# Patient Record
Sex: Male | Born: 1970
Health system: Southern US, Community
[De-identification: ages and names within clinical notes are randomized; demographics above are authoritative.]

## PROBLEM LIST (undated history)

## (undated) DIAGNOSIS — M75111 Incomplete rotator cuff tear or rupture of right shoulder, not specified as traumatic: Secondary | ICD-10-CM

## (undated) DIAGNOSIS — E785 Hyperlipidemia, unspecified: Secondary | ICD-10-CM

## (undated) DIAGNOSIS — D649 Anemia, unspecified: Secondary | ICD-10-CM

## (undated) DIAGNOSIS — K222 Esophageal obstruction: Secondary | ICD-10-CM

## (undated) DIAGNOSIS — I4891 Unspecified atrial fibrillation: Secondary | ICD-10-CM

## (undated) DIAGNOSIS — I509 Heart failure, unspecified: Secondary | ICD-10-CM

## (undated) DIAGNOSIS — K219 Gastro-esophageal reflux disease without esophagitis: Secondary | ICD-10-CM

## (undated) DIAGNOSIS — I4510 Unspecified right bundle-branch block: Secondary | ICD-10-CM

## (undated) DIAGNOSIS — G4730 Sleep apnea, unspecified: Secondary | ICD-10-CM

## (undated) DIAGNOSIS — N529 Male erectile dysfunction, unspecified: Secondary | ICD-10-CM

## (undated) DIAGNOSIS — A609 Anogenital herpesviral infection, unspecified: Secondary | ICD-10-CM

## (undated) HISTORY — PX: COLONOSCOPY: SHX174

## (undated) HISTORY — DX: Esophageal obstruction: K22.2

## (undated) HISTORY — DX: Gilbert syndrome: E80.4

## (undated) HISTORY — PX: INGUINAL HERNIA REPAIR: SHX194

## (undated) HISTORY — DX: Anemia, unspecified: D64.9

## (undated) HISTORY — DX: Hyperlipidemia, unspecified: E78.5

## (undated) HISTORY — DX: Gastro-esophageal reflux disease without esophagitis: K21.9

---

## 1992-03-07 HISTORY — PX: FINGER SURGERY: SHX640

## 1998-05-29 ENCOUNTER — Emergency Department (HOSPITAL_COMMUNITY): Admission: EM | Admit: 1998-05-29 | Discharge: 1998-05-29 | Payer: Self-pay | Admitting: Emergency Medicine

## 1999-08-14 ENCOUNTER — Emergency Department (HOSPITAL_COMMUNITY): Admission: EM | Admit: 1999-08-14 | Discharge: 1999-08-14 | Payer: Self-pay | Admitting: Emergency Medicine

## 2004-04-22 ENCOUNTER — Ambulatory Visit: Payer: Self-pay | Admitting: Internal Medicine

## 2004-04-26 ENCOUNTER — Ambulatory Visit: Payer: Self-pay | Admitting: Internal Medicine

## 2004-10-25 ENCOUNTER — Ambulatory Visit: Payer: Self-pay | Admitting: Internal Medicine

## 2005-02-11 ENCOUNTER — Ambulatory Visit: Payer: Self-pay | Admitting: Internal Medicine

## 2005-02-18 ENCOUNTER — Ambulatory Visit: Payer: Self-pay | Admitting: Internal Medicine

## 2005-04-28 ENCOUNTER — Ambulatory Visit: Payer: Self-pay | Admitting: Internal Medicine

## 2005-09-08 ENCOUNTER — Ambulatory Visit: Payer: Self-pay | Admitting: Internal Medicine

## 2005-09-26 ENCOUNTER — Ambulatory Visit: Payer: Self-pay | Admitting: Internal Medicine

## 2006-05-15 ENCOUNTER — Ambulatory Visit: Payer: Self-pay | Admitting: Internal Medicine

## 2006-05-22 ENCOUNTER — Ambulatory Visit: Payer: Self-pay | Admitting: Internal Medicine

## 2006-05-22 LAB — CONVERTED CEMR LAB
Albumin: 4 g/dL (ref 3.5–5.2)
Alkaline Phosphatase: 66 units/L (ref 39–117)
BUN: 13 mg/dL (ref 6–23)
CO2: 32 meq/L (ref 19–32)
Creatinine, Ser: 1 mg/dL (ref 0.4–1.5)
GFR calc non Af Amer: 89 mL/min
Glucose, Bld: 87 mg/dL (ref 70–99)
TSH: 0.8 microintl units/mL (ref 0.35–5.50)
Total Bilirubin: 0.8 mg/dL (ref 0.3–1.2)
Total Protein: 6.7 g/dL (ref 6.0–8.3)

## 2006-05-31 ENCOUNTER — Ambulatory Visit: Payer: Self-pay | Admitting: Internal Medicine

## 2007-09-05 ENCOUNTER — Ambulatory Visit: Payer: Self-pay | Admitting: Internal Medicine

## 2007-09-05 LAB — CONVERTED CEMR LAB
Basophils Absolute: 0.1 10*3/uL (ref 0.0–0.1)
Bilirubin Urine: NEGATIVE
Bilirubin, Direct: 0.1 mg/dL (ref 0.0–0.3)
Blood in Urine, dipstick: NEGATIVE
Calcium: 9.4 mg/dL (ref 8.4–10.5)
Cholesterol: 186 mg/dL (ref 0–200)
Eosinophils Absolute: 0.2 10*3/uL (ref 0.0–0.7)
GFR calc Af Amer: 96 mL/min
GFR calc non Af Amer: 79 mL/min
HCT: 40.2 % (ref 39.0–52.0)
HDL: 45.8 mg/dL (ref >39.0)
Hemoglobin: 13.9 g/dL (ref 13.0–17.0)
Ketones, urine, test strip: NEGATIVE
MCHC: 34.6 g/dL (ref 30.0–36.0)
Monocytes Absolute: 0.5 10*3/uL (ref 0.1–1.0)
Neutro Abs: 2.1 10*3/uL (ref 1.4–7.7)
Platelets: 209 10*3/uL (ref 150–400)
RDW: 12.5 % (ref 11.5–14.6)
Sodium: 142 meq/L (ref 135–145)
Specific Gravity, Urine: 1.02
Total Bilirubin: 0.9 mg/dL (ref 0.3–1.2)
Total Protein: 6.9 g/dL (ref 6.0–8.3)
Triglycerides: 56 mg/dL (ref 0–149)
Urobilinogen, UA: 0.2

## 2007-09-14 ENCOUNTER — Ambulatory Visit: Payer: Self-pay | Admitting: Internal Medicine

## 2007-09-14 DIAGNOSIS — J45909 Unspecified asthma, uncomplicated: Secondary | ICD-10-CM | POA: Insufficient documentation

## 2007-09-14 DIAGNOSIS — S6710XA Crushing injury of unspecified finger(s), initial encounter: Secondary | ICD-10-CM | POA: Insufficient documentation

## 2007-09-14 DIAGNOSIS — J309 Allergic rhinitis, unspecified: Secondary | ICD-10-CM | POA: Insufficient documentation

## 2007-09-14 DIAGNOSIS — Z87898 Personal history of other specified conditions: Secondary | ICD-10-CM | POA: Insufficient documentation

## 2007-09-14 DIAGNOSIS — E785 Hyperlipidemia, unspecified: Secondary | ICD-10-CM | POA: Insufficient documentation

## 2007-12-04 ENCOUNTER — Encounter: Payer: Self-pay | Admitting: Internal Medicine

## 2008-09-16 ENCOUNTER — Telehealth (INDEPENDENT_AMBULATORY_CARE_PROVIDER_SITE_OTHER): Payer: Self-pay | Admitting: *Deleted

## 2008-09-16 ENCOUNTER — Ambulatory Visit: Payer: Self-pay | Admitting: Internal Medicine

## 2008-09-16 DIAGNOSIS — R42 Dizziness and giddiness: Secondary | ICD-10-CM | POA: Insufficient documentation

## 2008-09-16 DIAGNOSIS — I959 Hypotension, unspecified: Secondary | ICD-10-CM | POA: Insufficient documentation

## 2008-09-16 LAB — CONVERTED CEMR LAB: Hemoglobin: 14.6 g/dL

## 2008-09-17 ENCOUNTER — Encounter: Payer: Self-pay | Admitting: Internal Medicine

## 2010-04-04 LAB — CONVERTED CEMR LAB
Cholesterol, target level: 200 mg/dL
HDL goal, serum: 40 mg/dL
LDL Goal: 75 mg/dL

## 2010-05-01 ENCOUNTER — Encounter: Payer: Self-pay | Admitting: Internal Medicine

## 2010-06-15 ENCOUNTER — Encounter: Payer: Self-pay | Admitting: Internal Medicine

## 2010-06-15 ENCOUNTER — Ambulatory Visit (INDEPENDENT_AMBULATORY_CARE_PROVIDER_SITE_OTHER): Payer: BC Managed Care – PPO | Admitting: Internal Medicine

## 2010-06-15 VITALS — BP 141/85 | HR 61 | Temp 98.3°F | Resp 14 | Wt 171.8 lb

## 2010-06-15 DIAGNOSIS — E785 Hyperlipidemia, unspecified: Secondary | ICD-10-CM

## 2010-06-15 DIAGNOSIS — K625 Hemorrhage of anus and rectum: Secondary | ICD-10-CM

## 2010-06-15 DIAGNOSIS — Z0000 Encounter for general adult medical examination without abnormal findings: Secondary | ICD-10-CM

## 2010-06-15 NOTE — Patient Instructions (Addendum)
Preventive Health Care: Exercise at least 30-45 minutes a day,  3-4 days a week.  Eat a low-fat diet with lots of fruits and vegetables, up to 7-9 servings per day. Avoid obesity; your goal is waist measurement < 40 inches.Consume less than 40 grams of sugar per day from foods & drinks with High Fructose Corn Sugar as #2,3 or # 4 on label. Eye Doctor - have an eye exam @ least annually.                                                                               Alcohol If you drink, do it moderately,less than 9 drinks per week, preferably less than 6 @ most. Depression is common in our stressful world.If you're feeling down or losing interest in things you normally enjoy, please call .                                                                                                                               Please schedule fasting Labs : Boston Heart Panel ( 1304X) , CBC & dif  ( Codes: V70.0, 272.4, Please call Dr Ardell Isaacs office & verify colonoscopy monitor schedule he recommends.

## 2010-06-15 NOTE — Progress Notes (Signed)
  Subjective:   Patient ID: Jacob Buchanan, male    DOB: 1971/03/01 , 40 yo   BJY:[id number]  HPI Jacob Buchanan is here for a physical; he has had intermittent ( monthly) uncomfortable ( pressure) but not painful  bright  rectal bleeding. Colonoscopy was negative in 2007. Internal hemorrhoids have been documented @ Flex Sigmoidoscopy & colonoscopy.    Review of Systems Patient reports no  vision/ hearing changes,anorexia, weight change, fever ,adenopathy, persistant / recurrent hoarseness, swallowing issues, chest pain,palpitations, edema,persistant / recurrent cough, hemoptysis, dyspnea(rest, exertional, paroxysmal nocturnal), melena, abdominal pain, excessive heart burn, GU symptoms( dysuria, hematuria, pyuria, ) syncope, focal weakness, memory loss,numbness & tingling, skin/hair/nail changes,depression, anxiety, abnormal bruising/bleeding, musculoskeletal symptoms/signs.     Objective:  Physical Exam   Gen.: Thin but healthy and well-nourished in appearance. Alert, appropriate and cooperative throughout exam. Head: Normocephalic without obvious abnormalities;no  alopecia. Eyes: No corneal or conjunctival inflammation noted. Pupils equal round reactive to light and accommodation. Fundal exam is benign without hemorrhages, exudate, papilledema. Extraocular motion intact. Vision grossly normal. Ears: External  ear exam reveals no significant lesions or deformities. Canals clear .TMs normal. Hearing is grossly normal bilaterally. Nose: External nasal exam reveals no deformity or inflammation. Nasal mucosa are pink and moist. No lesions or exudates noted. Septum  Slightly dislocated.  Mouth: Oral mucosa and oropharynx reveal no lesions or exudates. Teeth in good repair. Neck: No deformities, masses, or tenderness noted. Range of motion normal. Thyroid  w/o nodules or enlargement. Lungs: Normal respiratory effort; chest expands symmetrically. Lungs are clear to auscultation without rales, wheezes, or  increased work of breathing. Heart: Normal rate and rhythm. Normal S1 and S2. No gallop, click, or rub. No murmur. Abdomen: Bowel sounds normal; abdomen soft and nontender. No masses, organomegaly or hernias noted. Genitalia: Scrotal and penile  exam varicocoele on L . DRE  Minimal asymmetry(L> R) w/o enlargement , induration or nodules . Musculoskeletal/extremities: No deformity or scoliosis noted of  the thoracic or lumbar spine. No clubbing, cyanosis, edema, or deformity noted. Range of motion  normal .Tone & strength  normal.Joints normal. Nail health  good. Vascular: Carotid, radial artery, dorsalis pedis and dorsalis posterior tibial pulses are full and equal. No bruits present. Neurologic: Alert and oriented x3. Deep tendon reflexes symmetrical and normal. Skin: Intact without suspicious lesions or rashes.  Lymph: No cervical, axillary, or inguinal lymphadenopathy present.  Psych: Mood and affect are normal. Normally interactive                                                                                  Assessment & Plan:  #1Wellness Exam #2 recurrent rectal bleeding from internal hemorrhoids #3 Dyslipidemia  Plan: #1 Boston Heart Panel (1304X) #2 Colonoscopic monitor as  per Dr Russella Dar

## 2010-06-15 NOTE — Assessment & Plan Note (Addendum)
LDL 137 in 2004. NMR Liipoprofile  2006: LDL 131 ( 2168/1697), HDL  33, TG 182. LDL goal = < 100.

## 2010-06-16 ENCOUNTER — Encounter: Payer: Self-pay | Admitting: Internal Medicine

## 2010-06-18 ENCOUNTER — Other Ambulatory Visit (INDEPENDENT_AMBULATORY_CARE_PROVIDER_SITE_OTHER): Payer: BC Managed Care – PPO

## 2010-06-18 DIAGNOSIS — Z0000 Encounter for general adult medical examination without abnormal findings: Secondary | ICD-10-CM

## 2010-06-18 DIAGNOSIS — E785 Hyperlipidemia, unspecified: Secondary | ICD-10-CM

## 2010-06-18 LAB — CBC WITH DIFFERENTIAL/PLATELET
Basophils Absolute: 0 10*3/uL (ref 0.0–0.1)
Eosinophils Absolute: 0.3 10*3/uL (ref 0.0–0.7)
HCT: 41.7 % (ref 39.0–52.0)
Lymphocytes Relative: 29.2 % (ref 12.0–46.0)
Lymphs Abs: 1.1 10*3/uL (ref 0.7–4.0)
MCHC: 35.2 g/dL (ref 30.0–36.0)
Monocytes Relative: 12.4 % — ABNORMAL HIGH (ref 3.0–12.0)
Platelets: 178 10*3/uL (ref 150.0–400.0)
RDW: 12.6 % (ref 11.5–14.6)

## 2010-07-09 ENCOUNTER — Encounter: Payer: Self-pay | Admitting: Internal Medicine

## 2010-07-23 NOTE — Letter (Signed)
October 24, 2005     [name], M.D.  [address]  [place], [place]  [address]   RE:  [name], [name]  MRN:  [id number]  /  DOB:  1970-07-03   Dear Lajuana Ripple:   I saw Jacob Buchanan on July 5 with rectal bleeding which has been an  intermittent phenomena for over 5 years.  He stated that over the previous  month 85% of the bowel movements have been associated with some blood in the  toilet water.  He had some discomfort.  He denies any melena.  He had no  constitutional symptoms associated with this.   Colonoscopy performed by you in July of 2004 revealed only internal  hemorrhoids.  He also had a flexible sigmoidoscopy in 2001 which showed  internal hemorrhoids as well.  There has never been evidence of colitis.   On exam hemoccult testing was negative.  There was a tiny fissure in ano.  Prostate was borderline enlarged.   Rectal hygiene was discussed and Proctofoam-HC prescribed following sitz  baths.   He called back on the 20 of August stating that the rectal bleeding was  persisting.   Would you be kind enough to schedule a GI consultation.  He has had no  evidence of any bleeding dyscrasia.    Respectively,.       Titus Dubin. Alwyn Ren, MD, FACP, [facility]   [facility]  DD:  10/24/2005  DT:  10/25/2005  Job #:  [id number]

## 2010-10-13 ENCOUNTER — Ambulatory Visit (INDEPENDENT_AMBULATORY_CARE_PROVIDER_SITE_OTHER): Payer: BC Managed Care – PPO | Admitting: Internal Medicine

## 2010-10-13 ENCOUNTER — Encounter: Payer: Self-pay | Admitting: Internal Medicine

## 2010-10-13 ENCOUNTER — Telehealth: Payer: Self-pay | Admitting: Internal Medicine

## 2010-10-13 VITALS — BP 132/98 | HR 85 | Wt 177.0 lb

## 2010-10-13 DIAGNOSIS — F32A Depression, unspecified: Secondary | ICD-10-CM

## 2010-10-13 DIAGNOSIS — F329 Major depressive disorder, single episode, unspecified: Secondary | ICD-10-CM

## 2010-10-13 MED ORDER — VENLAFAXINE HCL ER 37.5 MG PO CP24: 37.5000 mg | ORAL_CAPSULE | ORAL | Status: DC

## 2010-10-13 NOTE — Progress Notes (Signed)
  Subjective:    Patient ID: Jacob Buchanan, male    DOB: 1970-04-21, 40 y.o.   MRN: [id number]  HPI Mental Health Symptoms: Onset: within past 12 months as  Increased somnulence,  being unhappy & tired Anxiety:no Depression:6 -7 on 10 scale Loss of interest (Anhedonia):no Panic attacks:no Anger: 3 years Insomnia:no Anorexia:decreased Fatigue:6 on 10 scale Suicidal ideation: Yes but no plans for  acting Neurologic signs/symptoms: no headache, numbness and tingling, weakness: Endocrinologic signs and symptoms: no hoarseness, weight change, vision change, temperature intolerance, bowel changes, skin/hair,/nail changes Family history of mental health issues, alcoholism or drug abuse: mother on Prozac for 3 decades with + response; both sisters on anti depressants Medications/efficacy:no He & his wife are seeing separate  therapists; they're also seeing a marriage counselor jointly.She is seeing Dr Jennelle Human, Psychiatrist. He saw Dr. Tomasa Rand, Psychiatrist, last week; it is his impression that Dr. Tomasa Rand felt he might have bipolar disorder.       Review of Systems     Objective:   Physical Exam he was not examined but we discussed in detail the pathophysiology of neurotransmitter deficiencies. Diagrams were provided. The screening test for bipolar disorder was administered. He answered all queries  as negative and "no problem".      Assessment & Plan:  #1 profound depression; rule out bipolar disorder  #2 family history of depression; his mother definitely improved on an SSRI. He will verify medications his sisters are taking  Plan: Effexor will be titrated to a dose of 150 mg daily over 3 weeks. The risk of exacerbating bipolar disorder was discussed and he understands the pathophysiology of this.  He will contact me with the name of the psychiatrist withwhom he has an appointment in October.

## 2010-10-13 NOTE — Patient Instructions (Signed)
Please review the diagrams we discussed and the screening form. Titrate the medication by one pill every 5 days until  @  a dose of 150 mg. Please provide the name of the psychiatrist whom I can contact.

## 2010-10-13 NOTE — Telephone Encounter (Signed)
Spoke with patient's wife, per Dr.Hopper have patient come in at 4:45 today to further discuss depression

## 2010-10-15 ENCOUNTER — Telehealth: Payer: Self-pay | Admitting: Internal Medicine

## 2011-02-03 ENCOUNTER — Encounter: Payer: Self-pay | Admitting: Internal Medicine

## 2011-02-03 ENCOUNTER — Ambulatory Visit (INDEPENDENT_AMBULATORY_CARE_PROVIDER_SITE_OTHER): Payer: BC Managed Care – PPO | Admitting: Internal Medicine

## 2011-02-03 VITALS — BP 118/76 | HR 85 | Temp 98.9°F | Wt 181.6 lb

## 2011-02-03 DIAGNOSIS — J069 Acute upper respiratory infection, unspecified: Secondary | ICD-10-CM

## 2011-02-03 NOTE — Patient Instructions (Signed)
Plain Mucinex for thick secretions ;force NON dairy fluids 48 hrs. Use a Neti pot daily as needed for sinus congestion Zicam Melts or Zinc lozenges ; vitamin C 2000 mg daily; & Echinacea for 4-7 days. Report fever, exudate("pus") or progressive pain. Advair one inhalation every 12 hours; gargle and spit after use. Nasonex  sample 1 spray in each nostril twice a day as needed. Use the "crossover" technique as discussed

## 2011-02-03 NOTE — Progress Notes (Signed)
  Subjective:    Patient ID: Jacob Buchanan, male    DOB: 1970-05-07, 40 y.o.   MRN: [id number]  HPI Respiratory tract infection Onset/symptoms:11/26 as head & chest congestion Exposures (illness/environmental/extrinsic):occupational dust exposure Progression of symptoms:to cough Treatments/response:Neti pot, Nyquil, Claritin Present symptoms: Fever/chills/sweats:occasional chills Frontal headache:no Facial pain:maxillary pressure Nasal purulence:no Sore throat:no Dental pain:no Lymphadenopathy:no Wheezing/shortness of breath:slight SOB & wheezing Cough/sputum/hemoptysis:occasional clear- yellow sputum Associated extrinsic/allergic symptoms:itchy eyes/ sneezing:eyes watering; some sneezing Past medical history: Seasonal allergies:seasonal /asthma:quiescent for years Smoking history:never           Review of Systems     Objective:   Physical Exam General appearance is of good health and nourishment; no acute distress or increased work of breathing is present.  No  lymphadenopathy about the head, neck, or axilla noted.   Eyes: No conjunctival inflammation or lid edema is present.   Ears:  External ear exam shows no significant lesions or deformities.  Otoscopic examination reveals clear canals, tympanic membranes are intact bilaterally without bulging, retraction, inflammation or discharge.  Nose:  External nasal examination shows no deformity or inflammation. Nasal mucosa are pink and moist without lesions or exudates. Slight  septal dislocation to l; deviation to R.No obstruction to airflow. Slight hyponasal speech  Oral exam: Dental hygiene is good; lips and gums are healthy appearing.There is no oropharyngeal erythema or exudate noted.     Heart:  Normal rate and regular rhythm. S1 and S2 normal without gallop, murmur, click, rub or other extra sounds.   Lungs:Chest clear to auscultation; no wheezes, rhonchi,rales ,or rubs present.No increased work of breathing.      Extremities:  No cyanosis, edema, or clubbing  noted    Skin: Warm & dry           Assessment & Plan:  #1 viral URI; no RAD clinically Plan: See orders and recommendations

## 2011-09-06 ENCOUNTER — Telehealth: Payer: Self-pay | Admitting: Internal Medicine

## 2011-09-06 NOTE — Telephone Encounter (Signed)
Caller: Edon/Patient; PCP: Marga Melnick; CB#: [phone]; ; ; Call regarding Cough/Congestion;  Has nasal congestion/cough since 6-27. Coughs more when lays down at night. Is coughing up yellow mucus. Has sinus pressure. Afebrile. Appointment scheduled for 7-3 at 1000 with Dr. Alwyn Ren.

## 2011-09-07 ENCOUNTER — Ambulatory Visit (INDEPENDENT_AMBULATORY_CARE_PROVIDER_SITE_OTHER): Payer: BC Managed Care – PPO | Admitting: Internal Medicine

## 2011-09-07 ENCOUNTER — Encounter: Payer: Self-pay | Admitting: Internal Medicine

## 2011-09-07 VITALS — BP 118/76 | HR 64 | Temp 98.7°F | Wt 171.0 lb

## 2011-09-07 DIAGNOSIS — J209 Acute bronchitis, unspecified: Secondary | ICD-10-CM

## 2011-09-07 DIAGNOSIS — J019 Acute sinusitis, unspecified: Secondary | ICD-10-CM

## 2011-09-07 MED ORDER — HYDROCODONE-HOMATROPINE 5-1.5 MG/5ML PO SYRP: 5.0000 mL | ORAL_SOLUTION | Freq: Four times a day (QID) | ORAL | Status: AC | PRN

## 2011-09-07 MED ORDER — AMOXICILLIN-POT CLAVULANATE 875-125 MG PO TABS: 1.0000 | ORAL_TABLET | Freq: Two times a day (BID) | ORAL | Status: AC

## 2011-09-07 NOTE — Progress Notes (Signed)
  Subjective:    Patient ID: Jacob Buchanan, male    DOB: 08/27/1970, 41 y.o.   MRN: [id number]  HPI Respiratory tract infection Onset/symptoms: 09/02/11 with productive cough and post nasal drainage Exposures (illness/environmental/extrinsic): none Progression of symptoms: improved slightly Treatments/response: neti pot with some relief Present symptoms: mild sore throat, post nasal drainage, productive cough with brown sputum which is worse at night, brown nasal purulence, and cervical LA  Nasal purulence:brown   Wheezing/shortness of breath:mainly @ night Cough/sputum/hemoptysis:brown sputum  Associated extrinsic/allergic symptoms:itchy eyes/ sneezing:none prior to this episode Past medical history: Seasonal allergies/asthma:yes Smoking history:never           Review of Systems Fever/chills/sweats:no Frontal headache:no Facial pain:no Dental pain:no    Objective:   Physical Exam  he is thin, healthy and well-nourished  He has no lymphadenopathy about the neck or axilla. He is very ticklish.  There is no conjunctival inflammation  ENT exam is unremarkable except for mild septal dislocation or deviation  He has an S4 with no significant murmur gallop  Chest is clear with no wheezing or increased work of breathing  He has no cyanosis clubbing or edema         Assessment & Plan:  #1 bronchitis, acute with associated upper respiratory tract infection, rhinosinusitis  #2 history of asthma; clinically at this time there is no bronchospasm but he describes nocturnal wheezing  Plan: Sample of Advair will be employed every 12 hours. Antibiotics and cough syrup will be prescribed.

## 2011-09-07 NOTE — Patient Instructions (Addendum)
Plain Mucinex for thick secretions ;force NON dairy fluids . Use a Neti pot daily as needed for sinus congestion; going from open side to congested side . Nasal cleansing in the shower as discussed. Make sure that all residual soap is removed to prevent irritation. Plain Allegra 160 daily as needed for itchy eyes & sneezing. Advair sample one inhalation every 12 hours; gargle and spit after use

## 2011-09-26 ENCOUNTER — Telehealth: Payer: Self-pay | Admitting: Internal Medicine

## 2011-09-26 DIAGNOSIS — E785 Hyperlipidemia, unspecified: Secondary | ICD-10-CM

## 2011-09-26 DIAGNOSIS — Z0000 Encounter for general adult medical examination without abnormal findings: Secondary | ICD-10-CM

## 2011-09-26 NOTE — Telephone Encounter (Signed)
Pt would like to come in the morning of his cpe on 7/25 and have his labs done. He is requesting an STD check in addition to routine labs. Can you put the orders in the system?

## 2011-09-26 NOTE — Telephone Encounter (Signed)
Lipid/Hep/BMP/CBCD/TSH v70.0/272.4 (Future orders placed for these labs)  Dr.Hopper please advise on patient's request for STD testing and give DX codes

## 2011-09-27 NOTE — Telephone Encounter (Signed)
Add HIV, VDRL,HSV2 ; V70.0

## 2011-09-29 ENCOUNTER — Ambulatory Visit (INDEPENDENT_AMBULATORY_CARE_PROVIDER_SITE_OTHER): Payer: BC Managed Care – PPO | Admitting: Internal Medicine

## 2011-09-29 ENCOUNTER — Other Ambulatory Visit (INDEPENDENT_AMBULATORY_CARE_PROVIDER_SITE_OTHER): Payer: BC Managed Care – PPO

## 2011-09-29 ENCOUNTER — Encounter: Payer: Self-pay | Admitting: Internal Medicine

## 2011-09-29 VITALS — BP 138/80 | HR 62 | Temp 98.5°F | Resp 12 | Ht 73.5 in | Wt 172.2 lb

## 2011-09-29 DIAGNOSIS — E785 Hyperlipidemia, unspecified: Secondary | ICD-10-CM

## 2011-09-29 DIAGNOSIS — Z0000 Encounter for general adult medical examination without abnormal findings: Secondary | ICD-10-CM

## 2011-09-29 LAB — BASIC METABOLIC PANEL
BUN: 16 mg/dL (ref 6–23)
Chloride: 103 mEq/L (ref 96–112)
Creatinine, Ser: 1 mg/dL (ref 0.4–1.5)
GFR: 84.55 mL/min (ref >60.00)
Potassium: 4.1 mEq/L (ref 3.5–5.1)

## 2011-09-29 LAB — HEPATIC FUNCTION PANEL
ALT: 16 U/L (ref 0–53)
AST: 24 U/L (ref 0–37)
Bilirubin, Direct: 0 mg/dL (ref 0.0–0.3)
Total Bilirubin: 1 mg/dL (ref 0.3–1.2)

## 2011-09-29 LAB — CBC WITH DIFFERENTIAL/PLATELET
Basophils Absolute: 0.1 10*3/uL (ref 0.0–0.1)
Basophils Relative: 1.1 % (ref 0.0–3.0)
Eosinophils Absolute: 0.3 10*3/uL (ref 0.0–0.7)
Lymphocytes Relative: 25.4 % (ref 12.0–46.0)
MCHC: 34.6 g/dL (ref 30.0–36.0)
MCV: 97.4 fl (ref 78.0–100.0)
Monocytes Absolute: 0.6 10*3/uL (ref 0.1–1.0)
Neutrophils Relative %: 56.3 % (ref 43.0–77.0)
RBC: 4.95 Mil/uL (ref 4.22–5.81)
RDW: 12.7 % (ref 11.5–14.6)

## 2011-09-29 LAB — LIPID PANEL
Cholesterol: 192 mg/dL (ref 0–200)
LDL Cholesterol: 112 mg/dL — ABNORMAL HIGH (ref 0–99)
Total CHOL/HDL Ratio: 3
Triglycerides: 102 mg/dL (ref 0.0–149.0)
VLDL: 20.4 mg/dL (ref 0.0–40.0)

## 2011-09-29 LAB — RPR

## 2011-09-29 MED ORDER — FLUTICASONE-SALMETEROL 250-50 MCG/DOSE IN AEPB: 1.0000 | INHALATION_SPRAY | Freq: Two times a day (BID) | RESPIRATORY_TRACT | Status: DC

## 2011-09-29 MED ORDER — HYDROCORTISONE ACE-PRAMOXINE 1-1 % RE FOAM: 1.0000 | Freq: Two times a day (BID) | RECTAL | Status: AC

## 2011-09-29 NOTE — Patient Instructions (Addendum)
Soak your buttocks in Sitz bath twice a day and then apply the cream to the hemorrhoidal tissue. Advair one inhalation every 12 hours; gargle and spit after use . To prevent palpitations or tremor, avoid stimulants such as decongestants, diet pills, nicotine, or caffeine (coffee, tea, cola, or chocolate) to excess. Please try to go on My Chart within the next 24 hours to allow me to release the results directly to you.

## 2011-09-29 NOTE — Progress Notes (Signed)
  Subjective:    Patient ID: Jacob Buchanan, male    DOB: Oct 15, 1970, 41 y.o.   MRN: [id number]  HPI Mr Everett is here for a physical;acute issues include hand tremor intermittently.     Review of Systems He has noted intermittent tremor of his hands over the past year. It does appear to be intentional as he notes is present drinking water or writing at times. He does not relate this to lack of sleep or stimulants.  He's had intermittent nonproductive cough. He specifically denies fever, chills, nasal purulence, or purulent sputum     Objective:   Physical Exam Gen.: Thin but healthy and well-nourished in appearance. Alert, appropriate and cooperative throughout exam. Head: Normocephalic without obvious abnormalities; no alopecia  Eyes: No corneal or conjunctival inflammation noted. Pupils equal round reactive to light and accommodation. Fundal exam is benign without hemorrhages, exudate, papilledema. Extraocular motion intact. Vision grossly normal. Ears: External  ear exam reveals no significant lesions or deformities. Canals clear .TMs normal. Hearing is grossly normal bilaterally. Nose: External nasal exam reveals no deformity or inflammation. Nasal mucosa are pink and moist. No lesions or exudates noted.   Mouth: Oral mucosa and oropharynx reveal no lesions or exudates. Teeth in good repair. Neck: No deformities, masses, or tenderness noted. Range of motion & Thyroid normal. Lungs: Normal respiratory effort; chest expands symmetrically. Lungs are clear to auscultation without rales, wheezes, or increased work of breathing. Heart: Normal rate and rhythm. Normal S1 and S2. No gallop,  or rub. Click @ apex w/o  murmur. Abdomen: Bowel sounds normal; abdomen soft and nontender. No masses, organomegaly or hernias noted. Genitalia/ DRE: Asymmetry of testes; left greater than right in size. Small granuloma in the left epididymal area. Small varices on the left. External hemorrhoid.Prostate  is normal without enlargement, asymmetry, nodularity, or induration.  Musculoskeletal/extremities: No deformity or scoliosis noted of  the thoracic or lumbar spine. No clubbing, cyanosis, edema, or deformity noted. Range of motion  normal .Tone & strength  normal.Joints normal. Nail health  good. Vascular: Carotid, radial artery, dorsalis pedis and  posterior tibial pulses are full and equal. No bruits present. Neurologic: Alert and oriented x3. Deep tendon reflexes symmetrical and normal.          Skin: Intact without suspicious lesions or rashes. Lymph: No cervical, axillary, or inguinal lymphadenopathy present. Psych: Mood and affect are normal. Normally interactive                                                                                         Assessment & Plan:  #1 comprehensive physical exam; no acute findings #2 see Problem List with Assessments & Recommendations #3 benign essential tremor; nonselective beta blocker could be employed as needed if there is no asthma present. Stimulants should be to minimize #4 cough, probable post infectious reactive airways symptoms  #5 external hemorrhoid Plan: see Orders

## 2011-09-29 NOTE — Progress Notes (Signed)
Labs only

## 2011-09-30 LAB — HSV 2 ANTIBODY, IGG: HSV 2 Glycoprotein G Ab, IgG: 0.12 IV

## 2011-09-30 LAB — HIV ANTIBODY (ROUTINE TESTING W REFLEX): HIV: NONREACTIVE

## 2011-11-03 ENCOUNTER — Telehealth: Payer: Self-pay | Admitting: Internal Medicine

## 2011-11-03 NOTE — Telephone Encounter (Signed)
Caller: Crespin/Patient; Patient Name: Jacob Buchanan; PCP: Marga Melnick; Best Callback Phone Number: [phone].  Pt. calling about last TD.  Looked up information in EPIC:  09/2007.  Pt. encurred a scrape on his leg yesterday.  He declines triage, and just wanted to make sure his TD was up to date.  CAN/db.

## 2011-11-03 NOTE — Telephone Encounter (Signed)
[facility] ([facility]

## 2011-11-15 ENCOUNTER — Telehealth: Payer: Self-pay | Admitting: Internal Medicine

## 2011-11-15 ENCOUNTER — Encounter: Payer: Self-pay | Admitting: Gastroenterology

## 2011-11-15 DIAGNOSIS — K649 Unspecified hemorrhoids: Secondary | ICD-10-CM

## 2011-11-15 MED ORDER — SILDENAFIL CITRATE 100 MG PO TABS: ORAL_TABLET | ORAL | Status: DC

## 2011-11-15 NOTE — Telephone Encounter (Signed)
Pt states that he recently discuss these issues at last OV.Please advise on referral

## 2011-11-15 NOTE — Telephone Encounter (Signed)
Recommend referral to GI if he wants to pursue treatment of hemorrhoids.  A trial of Viagra 100 mg one half-1 pill daily as needed. Dispense 5, refill x2

## 2011-11-15 NOTE — Telephone Encounter (Signed)
Jacob Buchanan [phone]: Patient request to speak with Dr. Alwyn Ren regarding ED and hemorrhoids. Wants to get Dr. Frederik Pear advice on what he needs to do or would he need a referral to a specialist. Please call him back. Thanks

## 2011-11-15 NOTE — Telephone Encounter (Signed)
Spoke with patient, referral placed, rx sent to pharmacy

## 2011-12-13 ENCOUNTER — Encounter: Payer: Self-pay | Admitting: Gastroenterology

## 2011-12-13 ENCOUNTER — Ambulatory Visit (INDEPENDENT_AMBULATORY_CARE_PROVIDER_SITE_OTHER): Payer: BC Managed Care – PPO | Admitting: Gastroenterology

## 2011-12-13 VITALS — BP 112/60 | HR 72 | Ht 73.5 in | Wt 170.2 lb

## 2011-12-13 DIAGNOSIS — R195 Other fecal abnormalities: Secondary | ICD-10-CM

## 2011-12-13 DIAGNOSIS — K921 Melena: Secondary | ICD-10-CM

## 2011-12-13 MED ORDER — HYDROCORTISONE ACE-PRAMOXINE 2.5-1 % RE CREA: TOPICAL_CREAM | Freq: Two times a day (BID) | RECTAL | Status: DC

## 2011-12-13 MED ORDER — HYDROCORTISONE ACETATE 25 MG RE SUPP: 25.0000 mg | Freq: Two times a day (BID) | RECTAL | Status: DC | PRN

## 2011-12-13 MED ORDER — PEG-KCL-NACL-NASULF-NA ASC-C 100 G PO SOLR: 1.0000 | Freq: Once | ORAL | Status: DC

## 2011-12-13 NOTE — Patient Instructions (Addendum)
You have been scheduled for a colonoscopy with propofol. Please follow written instructions given to you at your visit today.  Please pick up your prep kit at the pharmacy within the next 1-3 days. If you use inhalers (even only as needed), please bring them with you on the day of your procedure.  We have sent the following medications to your pharmacy for you to pick up at your convenience:Anusol suppositories, Analpram cream.  You have been given rectal care instructions.

## 2011-12-13 NOTE — Progress Notes (Signed)
History of Present Illness: This is a 41 year old male who relates long-term problems with recurrent small volume hematochezia and anal pain. He underwent colonoscopy in 2004 showing internal hemorrhoids. He relates an external hemorrhoid that swells and bleeds for about 4-5 days each month. He's not been treated with suppositories or creams. Denies weight loss, abdominal pain, constipation, diarrhea, change in stool caliber, melena, nausea, vomiting, dysphagia, reflux symptoms, chest pain.  Review of Systems: Pertinent positive and negative review of systems were noted in the above HPI section. All other review of systems were otherwise negative.  Current Medications, Allergies, Past Medical History, Past Surgical History, Family History and Social History were reviewed in [employer] record.  Physical Exam: General: Well developed , well nourished, no acute distress Head: Normocephalic and atraumatic Eyes:  sclerae anicteric, EOMI Ears: Normal auditory acuity Mouth: No deformity or lesions Neck: Supple, no masses or thyromegaly Lungs: Clear throughout to auscultation Heart: Regular rate and rhythm; no murmurs, rubs or bruits Abdomen: Soft, non tender and non distended. No masses, hepatosplenomegaly or hernias noted. Normal Bowel sounds Rectal: Small external hemorrhoids and hemorrhoidal tags, no internal lesions, 2+ Hemoccult positive brown stool Musculoskeletal: Symmetrical with no gross deformities  Skin: No lesions on visible extremities Pulses:  Normal pulses noted Extremities: No clubbing, cyanosis, edema or deformities noted Neurological: Alert oriented x 4, grossly nonfocal Cervical Nodes:  No significant cervical adenopathy Inguinal Nodes: No significant inguinal adenopathy Psychological:  Alert and cooperative. Normal mood and affect  Assessment and Recommendations:  1. Intermittent small-volume hematochezia and Hemoccult-positive stool. I suspect his  symptoms are related to internal and external hemorrhoids. Rule out colorectal neoplasms, proctitis. Begin Anusol suppositories and Analpram cream. Begin standard rectal care instructions. Schedule colonoscopy. Consider surgical referral based on findings at colonoscopy. The risks, benefits, and alternatives to colonoscopy with possible biopsy and possible polypectomy were discussed with the patient and they consent to proceed.

## 2012-01-10 ENCOUNTER — Telehealth: Payer: Self-pay | Admitting: Gastroenterology

## 2012-01-10 NOTE — Telephone Encounter (Signed)
Yes charge 

## 2012-01-12 ENCOUNTER — Encounter: Payer: BC Managed Care – PPO | Admitting: Gastroenterology

## 2012-04-09 ENCOUNTER — Encounter: Payer: Self-pay | Admitting: Gastroenterology

## 2012-04-17 ENCOUNTER — Telehealth: Payer: Self-pay

## 2012-04-17 NOTE — Telephone Encounter (Signed)
Message sent to VM: Patient states he has something that came from his anus (not sure what it is) patient states may be it needs to be sent off for further evaluation. Patient would like a call to give further instructions.  Per Dr.Hopper patient will need an appointment to discuss, patient to bring in sample that he speaks of. Left message on VM for patient to return call first thing in the am to schedule appointment with Dr.Hopper.

## 2012-04-18 ENCOUNTER — Ambulatory Visit (INDEPENDENT_AMBULATORY_CARE_PROVIDER_SITE_OTHER): Payer: BC Managed Care – PPO | Admitting: Internal Medicine

## 2012-04-18 ENCOUNTER — Encounter: Payer: Self-pay | Admitting: Internal Medicine

## 2012-04-18 VITALS — BP 124/80 | HR 75 | Temp 98.2°F | Wt 167.0 lb

## 2012-04-18 DIAGNOSIS — R195 Other fecal abnormalities: Secondary | ICD-10-CM

## 2012-04-18 DIAGNOSIS — J069 Acute upper respiratory infection, unspecified: Secondary | ICD-10-CM

## 2012-04-18 LAB — CBC WITH DIFFERENTIAL/PLATELET
Basophils Absolute: 0 10*3/uL (ref 0.0–0.1)
Basophils Relative: 0.4 % (ref 0.0–3.0)
Eosinophils Absolute: 0.1 10*3/uL (ref 0.0–0.7)
Lymphocytes Relative: 11.7 % — ABNORMAL LOW (ref 12.0–46.0)
MCHC: 34.8 g/dL (ref 30.0–36.0)
Neutrophils Relative %: 77 % (ref 43.0–77.0)
Platelets: 223 10*3/uL (ref 150.0–400.0)
RBC: 4.51 Mil/uL (ref 4.22–5.81)

## 2012-04-18 NOTE — Telephone Encounter (Signed)
Patient scheduled for today

## 2012-04-18 NOTE — Patient Instructions (Addendum)
Plain Mucinex (NOT D) for thick secretions ;force NON dairy fluids .   Nasal cleansing in the shower as discussed with lather of mild shampoo.After 10 seconds wash off lather while  exhaling through nostrils. Make sure that all residual soap is removed to prevent irritation.  Fluticasone 1 spray in each nostril twice a day as needed. Use the "crossover" technique into opposite nostril spraying toward opposite ear @ 45 degree angle, not straight up into nostril.  Use a Neti pot daily only  as needed for significant sinus congestion; going from open side to congested side . Plain Allegra (NOT D )  160 daily , Loratidine 10 mg , OR Zyrtec 10 mg @ bedtime  as needed for itchy eyes & sneezing.    Please complete and return stool cards; these will determine whether there is any gastrointestinal bleeding risk.

## 2012-04-18 NOTE — Progress Notes (Signed)
  Subjective:    Patient ID: Jacob Buchanan, male    DOB: [birth year], 42 y.o.   MRN: [id number]  HPI After cleansing with  toilet tissue 04/16/12 he noticed some red tissue approximately the  size of the pinky finger nail. He questions whether this might be food matter from a [employer]. There was no associated pain.  Iintermittently over the last 10 years he has noted intermittent blood in the toilet water. He denies frank melena.  He's had flexible sigmoidoscopies on 2 occasions and colonoscopy on 1. He has an upcoming colonoscopy 05/29/12.    Review of Systems He's had a recent upper respiratory infection with nasal congestion. He has taken Allegra-D for this. He also has used nasal hygiene. This is improving and not associated with significant frontal headache, facial pain, nasal purulence. He also denies fever, chills, or sweats. He's had no unexplained weight loss.     Objective:   Physical Exam Gen.: Thin but healthy and well-nourished in appearance. Alert, appropriate and cooperative throughout exam.   Eyes: No corneal or conjunctival inflammation noted.  Ears: External  ear exam reveals no significant lesions or deformities. Canals clear .TMs normal. Hearing is grossly normal bilaterally. Nose: External nasal exam reveals no deformity or inflammation. Nasal mucosa are pink and moist. No lesions or exudates noted. Septum  To R  Mouth: Oral mucosa and oropharynx reveal no lesions or exudates. Teeth in good repair. Neck: No deformities, masses, or tenderness noted.  Lungs: Normal respiratory effort; chest expands symmetrically. Lungs are clear to auscultation without rales, wheezes, or increased work of breathing. Heart: Normal rate and rhythm. Normal S1 and S2. No gallop, click, or rub. . Abdomen: Bowel sounds normal; abdomen soft and nontender. No masses, organomegaly or hernias noted.       Skin: Intact without suspicious lesions or rashes. Lymph: No cervical, axillary  lymphadenopathy present. Psych: Mood and affect are normal. Normally interactive       He is brought in a spongy frankly red piece of tissue a 10 x 4 mm. It cannot be disrupted with compression.                                                                                   Assessment & Plan:  #1 passage of reddish tissue which appears to be proteinaceous and probably  represents undigested food. Clinically there is no sign of colitis.  #2 upper respiratory tract symptoms which are improving  Plan: CBC and differential and stool cards will be checked. He'll continue to monitor his stools. If he has abdominal pain or constitutional symptoms in the context of passage of such material; the colonoscopic study should be scheduled an earlier date.

## 2012-04-21 ENCOUNTER — Other Ambulatory Visit: Payer: Self-pay

## 2012-05-14 ENCOUNTER — Ambulatory Visit (AMBULATORY_SURGERY_CENTER): Payer: BC Managed Care – PPO | Admitting: *Deleted

## 2012-05-14 ENCOUNTER — Encounter: Payer: Self-pay | Admitting: Gastroenterology

## 2012-05-14 VITALS — Ht 74.0 in | Wt 170.0 lb

## 2012-05-14 DIAGNOSIS — R195 Other fecal abnormalities: Secondary | ICD-10-CM

## 2012-05-14 DIAGNOSIS — K921 Melena: Secondary | ICD-10-CM

## 2012-05-29 ENCOUNTER — Ambulatory Visit (AMBULATORY_SURGERY_CENTER): Payer: BC Managed Care – PPO | Admitting: Gastroenterology

## 2012-05-29 ENCOUNTER — Encounter: Payer: Self-pay | Admitting: Gastroenterology

## 2012-05-29 VITALS — BP 135/86 | HR 56 | Temp 96.6°F | Resp 19 | Ht 74.0 in | Wt 170.0 lb

## 2012-05-29 DIAGNOSIS — K648 Other hemorrhoids: Secondary | ICD-10-CM

## 2012-05-29 DIAGNOSIS — K921 Melena: Secondary | ICD-10-CM

## 2012-05-29 DIAGNOSIS — R195 Other fecal abnormalities: Secondary | ICD-10-CM

## 2012-05-29 MED ORDER — SODIUM CHLORIDE 0.9 % IV SOLN: 500.0000 mL | INTRAVENOUS | Status: DC

## 2012-05-29 NOTE — Op Note (Addendum)
[facility] [address] [place], [address]   COLONOSCOPY PROCEDURE REPORT  PATIENT: Jacob, Buchanan  MR#: [id number] BIRTHDATE: May 05, 1970 , 42  yrs. old GENDER: Male ENDOSCOPIST: Meryl Dare, MD, [facility] PROCEDURE DATE:  05/29/2012 PROCEDURE:   Colonoscopy, diagnostic and hemorrhoidectomy via sclerosing ASA CLASS:   Class II INDICATIONS:hematochezia and heme-positive stool. MEDICATIONS: MAC sedation, administered by CRNA and propofol (Diprivan) 300mg  IV DESCRIPTION OF PROCEDURE:   After the risks benefits and alternatives of the procedure were thoroughly explained, informed consent was obtained.  A digital rectal exam revealed external hemorrhoids.   The LB CF-H180AL [id number]  endoscope was introduced through the anus and advanced to the cecum, which was identified by both the appendix and ileocecal valve. No adverse events experienced.   The quality of the prep was excellent, using MoviPrep  The instrument was then slowly withdrawn as the colon was fully examined.  COLON FINDINGS: A normal appearing cecum, ileocecal valve, and appendiceal orifice were identified.  The ascending, hepatic flexure, transverse, splenic flexure, descending, sigmoid colon and rectum appeared unremarkable.  No polyps or cancers were seen. Retroflexed views revealed moderate internal hemorrhoids.  1.75 cc of 23.4% saline injected into 3 internal hemorrhoids well above the dentate line. The time to cecum=1 minutes 35 seconds.  Withdrawal time=11 minutes 40 seconds.  The scope was withdrawn and the procedure completed.  COMPLICATIONS: There were no complications.  ENDOSCOPIC IMPRESSION: 1.  Normal colon 2.  Internal and external hemorrhoids  RECOMMENDATIONS: 1.  High fiber diet with liberal fluid intake. 2.  Continue current colorectal screening recommendations for "routine risk" patients with a repeat colonoscopy in 10 years.   eSigned:  Meryl Dare, MD, [facility]  05/29/2012 8:48 AM

## 2012-05-29 NOTE — Progress Notes (Signed)
Patient did not experience any of the following events: a burn prior to discharge; a fall within the facility; wrong site/side/patient/procedure/implant event; or a hospital transfer or hospital admission upon discharge from the facility. (G8907) Patient did not have preoperative order for IV antibiotic SSI prophylaxis. (G8918)  

## 2012-05-29 NOTE — Patient Instructions (Addendum)

## 2012-05-29 NOTE — Progress Notes (Signed)
Called to room to assist during endoscopic procedure.  Patient ID and intended procedure confirmed with present staff. Received instructions for my participation in the procedure from the performing physician.  

## 2012-05-30 ENCOUNTER — Telehealth: Payer: Self-pay | Admitting: *Deleted

## 2012-05-30 NOTE — Telephone Encounter (Signed)
Name identifier, left message, follow-up 

## 2012-06-15 ENCOUNTER — Other Ambulatory Visit (INDEPENDENT_AMBULATORY_CARE_PROVIDER_SITE_OTHER): Payer: BC Managed Care – PPO

## 2012-06-15 DIAGNOSIS — Z1289 Encounter for screening for malignant neoplasm of other sites: Secondary | ICD-10-CM

## 2012-06-15 DIAGNOSIS — Z0000 Encounter for general adult medical examination without abnormal findings: Secondary | ICD-10-CM

## 2012-06-15 LAB — HEMOCCULT GUIAC POC 1CARD (OFFICE): Fecal Occult Blood, POC: NEGATIVE

## 2012-10-17 ENCOUNTER — Telehealth: Payer: Self-pay | Admitting: *Deleted

## 2012-10-17 NOTE — Telephone Encounter (Signed)
Patient left message on triage line requesting samples of Viagara, advised we did not have nay available but could send a script to pharmacy if he wants [place] to. Left message to return our call to let us know what he would like [place] to do.

## 2013-01-10 ENCOUNTER — Other Ambulatory Visit: Payer: Self-pay

## 2013-03-07 HISTORY — PX: UPPER GI ENDOSCOPY: SHX6162

## 2013-04-01 ENCOUNTER — Telehealth: Payer: Self-pay

## 2013-04-01 DIAGNOSIS — Z0000 Encounter for general adult medical examination without abnormal findings: Secondary | ICD-10-CM

## 2013-04-01 NOTE — Telephone Encounter (Addendum)
Medication List and allergies:  Reviewed and updated  90 day supply/mail order: na Local prescriptions: [facility] [address]  Immunizations due: declines flu vaccine  A/P:   No changes to FH, PSH or Personal Hx Flu vaccine--declines Tdap--09/2007 [facility]----05/2012--Dr Stark--nml PSA--see urology  To Discuss with Provider: Ongoing issue with hemorrhoids  Dysphagia--endoscopy neg  Labs ordered

## 2013-04-02 ENCOUNTER — Ambulatory Visit (INDEPENDENT_AMBULATORY_CARE_PROVIDER_SITE_OTHER): Payer: BC Managed Care – PPO | Admitting: Internal Medicine

## 2013-04-02 ENCOUNTER — Encounter: Payer: Self-pay | Admitting: Internal Medicine

## 2013-04-02 VITALS — BP 126/79 | HR 52 | Temp 98.5°F | Resp 16 | Ht 74.0 in | Wt 173.0 lb

## 2013-04-02 DIAGNOSIS — R1319 Other dysphagia: Secondary | ICD-10-CM

## 2013-04-02 DIAGNOSIS — E785 Hyperlipidemia, unspecified: Secondary | ICD-10-CM

## 2013-04-02 DIAGNOSIS — Z0000 Encounter for general adult medical examination without abnormal findings: Secondary | ICD-10-CM

## 2013-04-02 LAB — BASIC METABOLIC PANEL
BUN: 16 mg/dL (ref 6–23)
CHLORIDE: 104 meq/L (ref 96–112)
CO2: 31 mEq/L (ref 19–32)
CREATININE: 1 mg/dL (ref 0.4–1.5)
Calcium: 9.3 mg/dL (ref 8.4–10.5)
GFR: 82.1 mL/min (ref >60.00)
Glucose, Bld: 75 mg/dL (ref 70–99)
POTASSIUM: 4.1 meq/L (ref 3.5–5.1)
Sodium: 140 mEq/L (ref 135–145)

## 2013-04-02 LAB — HEPATIC FUNCTION PANEL
ALT: 20 U/L (ref 0–53)
AST: 25 U/L (ref 0–37)
Albumin: 4.1 g/dL (ref 3.5–5.2)
Alkaline Phosphatase: 70 U/L (ref 39–117)
BILIRUBIN DIRECT: 0.1 mg/dL (ref 0.0–0.3)
BILIRUBIN TOTAL: 1.3 mg/dL — AB (ref 0.3–1.2)
Total Protein: 7.1 g/dL (ref 6.0–8.3)

## 2013-04-02 LAB — CBC WITH DIFFERENTIAL/PLATELET
BASOS PCT: 0.3 % (ref 0.0–3.0)
Basophils Absolute: 0 10*3/uL (ref 0.0–0.1)
EOS ABS: 0.1 10*3/uL (ref 0.0–0.7)
Eosinophils Relative: 2.9 % (ref 0.0–5.0)
HCT: 44.7 % (ref 39.0–52.0)
HEMOGLOBIN: 15.4 g/dL (ref 13.0–17.0)
LYMPHS PCT: 25.7 % (ref 12.0–46.0)
Lymphs Abs: 1.3 10*3/uL (ref 0.7–4.0)
MCHC: 34.4 g/dL (ref 30.0–36.0)
MCV: 94.2 fl (ref 78.0–100.0)
MONO ABS: 0.4 10*3/uL (ref 0.1–1.0)
Monocytes Relative: 8.7 % (ref 3.0–12.0)
NEUTROS ABS: 3.2 10*3/uL (ref 1.4–7.7)
NEUTROS PCT: 62.4 % (ref 43.0–77.0)
Platelets: 190 10*3/uL (ref 150.0–400.0)
RBC: 4.74 Mil/uL (ref 4.22–5.81)
RDW: 12.7 % (ref 11.5–14.6)
WBC: 5.1 10*3/uL (ref 4.5–10.5)

## 2013-04-02 LAB — LIPID PANEL
Cholesterol: 189 mg/dL (ref 0–200)
HDL: 54.6 mg/dL (ref >39.00)
LDL Cholesterol: 122 mg/dL — ABNORMAL HIGH (ref 0–99)
Total CHOL/HDL Ratio: 3
Triglycerides: 62 mg/dL (ref 0.0–149.0)
VLDL: 12.4 mg/dL (ref 0.0–40.0)

## 2013-04-02 LAB — TSH: TSH: 0.71 u[IU]/mL (ref 0.35–5.50)

## 2013-04-02 MED ORDER — ESOMEPRAZOLE MAGNESIUM 40 MG PO CPDR: 40.0000 mg | DELAYED_RELEASE_CAPSULE | Freq: Every day | ORAL | Status: DC

## 2013-04-02 NOTE — Addendum Note (Signed)
Addended by: Silvio PateHOMPSON, Zuzu Befort D on: 04/02/2013 12:17 PM   Modules accepted: Orders

## 2013-04-02 NOTE — Progress Notes (Signed)
Pre-visit discussion using our clinic review tool, as applicable. No additional management support is needed unless otherwise documented below in the visit note.,

## 2013-04-02 NOTE — Addendum Note (Signed)
Addended by: Silvio PateHOMPSON, Prynce Jacober D on: 04/02/2013 12:07 PM   Modules accepted: Orders

## 2013-04-02 NOTE — Patient Instructions (Addendum)
Reflux of gastric acid may be asymptomatic as this may occur mainly during sleep.The triggers for reflux  include stress; the "aspirin family" ; alcohol; peppermint; and caffeine (coffee, tea, cola, and chocolate). The aspirin family would include aspirin and the nonsteroidal agents such as ibuprofen &  Naproxen. Tylenol would not cause reflux. If having symptoms ; food & drink should be avoided for @ least 2 hours before going to bed.  Followup as needed for your this acute issue. Please report any significant change in your symptoms. Your next office appointment will be determined based upon review of your pending labs. Those instructions will be transmitted to you through My Chart . Take the EKG to any emergency room or preop visits. There are nonspecific changes which have IMPROVED since 2013 .As long as there is no new change these are not clinically significant . If the old EKG is not available for comparison; it may result in unnecessary hospitalization for observation with significant unnecessary expense.

## 2013-04-02 NOTE — Progress Notes (Signed)
   Subjective:    Patient ID: Jacob AltoStephen C Buchanan, male    DOB: June 19, 1970, 43 y.o.   MRN: [id number]  HPI  He is here for a physical;acute issues include intermittent dysphagia, typically once a week. This has been present for at least 5 years but is becoming more frequent  He does have a history of seasonal rhinoconjunctivitis as well as asthma.  He has had colonoscopies which revealed only internal hemorrhoids. He will have rectal bleeding on average once a month.  He drinks 6 beers a week; drinks 2 cups of coffee a day; and chews mint  Daily.gum      Review of Systems   He denies significant  dyspepsia,  unexplained weight loss, abdominal pain, melena,  or small caliber stools.     Objective:   Physical Exam  Gen.: Thin but healthy and well-nourished in appearance. Alert, appropriate and cooperative throughout exam. Appears younger than stated age  Head: Normocephalic without obvious abnormalities; beard ; no alopecia  Eyes: No corneal or conjunctival inflammation noted. Pupils equal round reactive to light and accommodation. Extraocular motion intact.  Ears: External  ear exam reveals no significant lesions or deformities. Canals clear .TMs normal. Hearing is grossly normal bilaterally. Nose: External nasal exam reveals no deformity or inflammation. Nasal mucosa are pink and moist. No lesions or exudates noted.   Mouth: Oral mucosa and oropharynx reveal no lesions or exudates. Teeth in good repair. Neck: No deformities, masses, or tenderness noted. Range of motion &Thyroid  normal Lungs: Normal respiratory effort; chest expands symmetrically. Lungs are clear to auscultation without rales, wheezes, or increased work of breathing. Heart: Normal rate and rhythm. Normal S1 and S2. No gallop, click, or rub. S4 w/o murmur. Abdomen: Bowel sounds normal; abdomen soft and nontender. No masses, organomegaly or hernias noted. Genitalia: Genitalia normal except for left varices.Small external  hemorrhoid. Prostate is ULN enlarged w/o , asymmetry, nodularity, or induration                                    Musculoskeletal/extremities: No deformity or scoliosis noted of  the thoracic or lumbar spine.  No clubbing, cyanosis, edema, or significant extremity  deformity noted. Range of motion normal .Tone & strength normal. Hand joints normal . Fingernail  health good. Able to lie down & sit up w/o help. Negative SLR bilaterally Vascular: Carotid, radial artery, dorsalis pedis and  posterior tibial pulses are full and equal. No bruits present. Neurologic: Alert and oriented x3. Deep tendon reflexes symmetrical and normal.        Skin: Intact without suspicious lesions or rashes. Lymph: No cervical, axillary, or inguinal lymphadenopathy present. Psych: Mood and affect are normal. Normally interactive                                                                                        Assessment & Plan:  #1 comprehensive physical exam; no acute findings #2 dysphagia  Plan: see Orders  & Recommendations

## 2013-04-03 ENCOUNTER — Ambulatory Visit: Payer: BC Managed Care – PPO | Admitting: Gastroenterology

## 2013-04-03 ENCOUNTER — Telehealth: Payer: Self-pay | Admitting: Gastroenterology

## 2013-04-03 NOTE — Telephone Encounter (Signed)
No charge. 

## 2013-04-03 NOTE — Telephone Encounter (Signed)
Do you want to charge? 

## 2013-04-24 ENCOUNTER — Ambulatory Visit: Payer: BC Managed Care – PPO | Admitting: Gastroenterology

## 2013-05-22 ENCOUNTER — Ambulatory Visit (INDEPENDENT_AMBULATORY_CARE_PROVIDER_SITE_OTHER): Payer: BC Managed Care – PPO | Admitting: Gastroenterology

## 2013-05-22 ENCOUNTER — Encounter: Payer: Self-pay | Admitting: Gastroenterology

## 2013-05-22 VITALS — BP 116/68 | HR 60 | Ht 74.0 in | Wt 175.0 lb

## 2013-05-22 DIAGNOSIS — K644 Residual hemorrhoidal skin tags: Secondary | ICD-10-CM

## 2013-05-22 DIAGNOSIS — R1319 Other dysphagia: Secondary | ICD-10-CM

## 2013-05-22 NOTE — Progress Notes (Signed)
    History of Present Illness: This is a 43 year old male who relates a worsening history of intermittent solid food dysphagia over the past 1-2 years. His symptoms about every 2 months. He notes occasional throat clearing following meals but has no other reflux symptoms. He was treated with a one-month course of Nexium but did not notice any improvement in symptoms. He also has occasional rectal bleeding and an external rectal swelling occurring about once per month but he attributes to his external hemorrhoids. He underwent colonoscopy with injection sclerosis of internal hemorrhoids in March 2014.  Current Medications, Allergies, Past Medical History, Past Surgical History, Family History and Social History were reviewed in [facility] electronic medical record.  Physical Exam: General: Well developed , well nourished, no acute distress Head: Normocephalic and atraumatic Eyes:  sclerae anicteric, EOMI Ears: Normal auditory acuity Mouth: No deformity or lesions Lungs: Clear throughout to auscultation Heart: Regular rate and rhythm; no murmurs, rubs or bruits Abdomen: Soft, non tender and non distended. No masses, hepatosplenomegaly or hernias noted. Normal Bowel sounds Musculoskeletal: Symmetrical with no gross deformities  Pulses:  Normal pulses noted Extremities: No clubbing, cyanosis, edema or deformities noted Neurological: Alert oriented x 4, grossly nonfocal Psychological:  Alert and cooperative. Normal mood and affect  Assessment and Recommendations:  1. Dysphagia. Rule out esophageal stricture, motility disorder. Schedule barium esophagram and then upper endoscopy. The risks, benefits, and alternatives to endoscopy with possible biopsy and possible dilation were discussed with the patient and they consent to proceed.   2. External hemorrhoids and internal hemorrhoids. Frequent rectal bleeding with rectal swelling. Presumed persistent external hemorrhoid symptoms. Surgical  referral.

## 2013-05-22 NOTE — Patient Instructions (Signed)
You have been scheduled for an endoscopy with propofol. Please follow written instructions given to you at your visit today. If you use inhalers (even only as needed), please bring them with you on the day of your procedure.  You have been scheduled for a Barium Esophogram at [facility] (1st floor of the hospital) on 05/28/13 at 9:30am. Please arrive 15 minutes prior to your appointment for registration. Make certain not to have anything to eat or drink 6 hours prior to your test. If you need to reschedule for any reason, please contact radiology at [phone] to do so. __________________________________________________________________ A barium swallow is an examination that concentrates on views of the esophagus. This tends to be a double contrast exam (barium and two liquids which, when combined, create a gas to distend the wall of the oesophagus) or single contrast (non-ionic iodine based). The study is usually tailored to your symptoms so a good history is essential. Attention is paid during the study to the form, structure and configuration of the esophagus, looking for functional disorders (such as aspiration, dysphagia, achalasia, motility and reflux) EXAMINATION You may be asked to change into a gown, depending on the type of swallow being performed. A radiologist and radiographer will perform the procedure. The radiologist will advise you of the type of contrast selected for your procedure and direct you during the exam. You will be asked to stand, sit or lie in several different positions and to hold a small amount of fluid in your mouth before being asked to swallow while the imaging is performed .In some instances you may be asked to swallow barium coated marshmallows to assess the motility of a solid food bolus. The exam can be recorded as a digital or video fluoroscopy procedure. POST PROCEDURE It will take 1-2 days for the barium to pass through your system. To facilitate this,  it is important, unless otherwise directed, to increase your fluids for the next 24-48hrs and to resume your normal diet.  This test typically takes about 30 minutes to perform. __________________________________________________________________________________  Jacob QuinYou have been scheduled for an appointment with Dr. Maisie Fushomas at [facility]. Your appointment is on __________ at ___________. Please arrive at _________________ for registration. Make certain to bring a list of current medications, including any over the counter medications or vitamins. Also bring your co-pay if you have one as well as your insurance cards. [facility] is located at [address], [address]. Should you need to reschedule your appointment, please contact them at [phone].   Thank you for choosing me and [facility].  Venita LickMalcolm T. Pleas KochStark, Jr., MD., Clementeen GrahamFACG

## 2013-05-28 ENCOUNTER — Ambulatory Visit (HOSPITAL_COMMUNITY)
Admission: RE | Admit: 2013-05-28 | Discharge: 2013-05-28 | Disposition: A | Discharge status: Home / Self Care | Payer: BC Managed Care – PPO | Source: Ambulatory Visit | Attending: Gastroenterology | Admitting: Gastroenterology

## 2013-05-28 DIAGNOSIS — R1319 Other dysphagia: Secondary | ICD-10-CM

## 2013-05-28 DIAGNOSIS — R1310 Dysphagia, unspecified: Secondary | ICD-10-CM | POA: Diagnosis present

## 2013-05-28 DIAGNOSIS — K222 Esophageal obstruction: Secondary | ICD-10-CM | POA: Insufficient documentation

## 2013-06-05 ENCOUNTER — Ambulatory Visit (INDEPENDENT_AMBULATORY_CARE_PROVIDER_SITE_OTHER): Payer: 59 | Admitting: General Surgery

## 2013-06-19 ENCOUNTER — Ambulatory Visit (INDEPENDENT_AMBULATORY_CARE_PROVIDER_SITE_OTHER): Payer: 59 | Admitting: General Surgery

## 2013-07-01 ENCOUNTER — Telehealth: Payer: Self-pay | Admitting: Gastroenterology

## 2013-07-01 NOTE — Telephone Encounter (Signed)
Returned phone call to patient. States barium swallow showed narrowing in esophagus and wanted to know if a narrowing was found tomorrow if they would dilate during procedure or if he would have to come back in. Information given and all questions answered.

## 2013-07-02 ENCOUNTER — Ambulatory Visit (AMBULATORY_SURGERY_CENTER): Payer: 59 | Admitting: Gastroenterology

## 2013-07-02 ENCOUNTER — Encounter: Payer: Self-pay | Admitting: Gastroenterology

## 2013-07-02 VITALS — BP 108/73 | HR 55 | Temp 96.9°F | Resp 34 | Ht 74.0 in | Wt 175.0 lb

## 2013-07-02 DIAGNOSIS — R1319 Other dysphagia: Secondary | ICD-10-CM

## 2013-07-02 DIAGNOSIS — R933 Abnormal findings on diagnostic imaging of other parts of digestive tract: Secondary | ICD-10-CM

## 2013-07-02 DIAGNOSIS — K222 Esophageal obstruction: Secondary | ICD-10-CM

## 2013-07-02 MED ORDER — OMEPRAZOLE 40 MG PO CPDR: 40.0000 mg | DELAYED_RELEASE_CAPSULE | ORAL | Status: DC

## 2013-07-02 NOTE — Progress Notes (Signed)
Report to pacu rn, vss, bbs=clear 

## 2013-07-02 NOTE — Progress Notes (Signed)
Called to room to assist during endoscopic procedure.  Patient ID and intended procedure confirmed with present staff. Received instructions for my participation in the procedure from the performing physician.  

## 2013-07-02 NOTE — Patient Instructions (Signed)
Discharge instructions given with verbal understanding. Handout on a dilatation diet. resume previous medications. Prescription sent in to pharmacy. YOU HAD AN ENDOSCOPIC PROCEDURE TODAY AT THE [facility]: Refer to the procedure report that was given to you for any specific questions about what was found during the examination.  If the procedure report does not answer your questions, please call your gastroenterologist to clarify.  If you requested that your care partner not be given the details of your procedure findings, then the procedure report has been included in a sealed envelope for you to review at your convenience later.  YOU SHOULD EXPECT: Some feelings of bloating in the abdomen. Passage of more gas than usual.  Walking can help get rid of the air that was put into your GI tract during the procedure and reduce the bloating. If you had a lower endoscopy (such as a colonoscopy or flexible sigmoidoscopy) you may notice spotting of blood in your stool or on the toilet paper. If you underwent a bowel prep for your procedure, then you may not have a normal bowel movement for a few days.  DIET: Your first meal following the procedure should be a light meal and then it is ok to progress to your normal diet.  A half-sandwich or bowl of soup is an example of a good first meal.  Heavy or fried foods are harder to digest and may make you feel nauseous or bloated.  Likewise meals heavy in dairy and vegetables can cause extra gas to form and this can also increase the bloating.  Drink plenty of fluids but you should avoid alcoholic beverages for 24 hours.  ACTIVITY: Your care partner should take you home directly after the procedure.  You should plan to take it easy, moving slowly for the rest of the day.  You can resume normal activity the day after the procedure however you should NOT DRIVE or use heavy machinery for 24 hours (because of the sedation medicines used during the test).     SYMPTOMS TO REPORT IMMEDIATELY: A gastroenterologist can be reached at any hour.  During normal business hours, 8:30 AM to 5:00 PM Monday through Friday, call [phone].  After hours and on weekends, please call the GI answering service at [phone] who will take a message and have the physician on call contact you.   Following upper endoscopy (EGD)  Vomiting of blood or coffee ground material  New chest pain or pain under the shoulder blades  Painful or persistently difficult swallowing  New shortness of breath  Fever of 100F or higher  Black, tarry-looking stools  FOLLOW UP: If any biopsies were taken you will be contacted by phone or by letter within the next 1-3 weeks.  Call your gastroenterologist if you have not heard about the biopsies in 3 weeks.  Our staff will call the home number listed on your records the next business day following your procedure to check on you and address any questions or concerns that you may have at that time regarding the information given to you following your procedure. This is a courtesy call and so if there is no answer at the home number and we have not heard from you through the emergency physician on call, we will assume that you have returned to your regular daily activities without incident.  SIGNATURES/CONFIDENTIALITY: You and/or your care partner have signed paperwork which will be entered into your electronic medical record.  These signatures attest to the fact  that that the information above on your After Visit Summary has been reviewed and is understood.  Full responsibility of the confidentiality of this discharge information lies with you and/or your care-partner.

## 2013-07-02 NOTE — Op Note (Signed)
[facility] [address] [place], [id number]   ENDOSCOPY PROCEDURE REPORT  PATIENT: Jacob Buchanan, Jacob C.  MR#: [id number] BIRTHDATE: [birth year] , 43  yrs. old GENDER: Male ENDOSCOPIST: Meryl DareMalcolm T Stark, MD, [facility] PROCEDURE DATE:  07/02/2013 PROCEDURE:   EGD with dilatation over guidewire ASA CLASS:   Class II INDICATIONS:dysphagia.  abnormal barium esophagram MEDICATIONS: MAC sedation, administered by CRNA and propofol (Diprivan) 180mg  IV TOPICAL ANESTHETIC:   Cetacaine Spray DESCRIPTION OF PROCEDURE:   After the risks benefits and alternatives of the procedure were thoroughly explained, informed consent was obtained.  The     endoscope was introduced through the mouth  and advanced to the descending duodenum ,      The instrument was slowly withdrawn as the mucosa was carefully examined.  ESOPHAGUS: A stricture was found at the gastroesophageal junction. The stenosis was traversable with the endoscope. There was mild resistance to passage of the endoscope. A 1 cm superficial mucosal tear was noted on withdrawl of the endoscope at the EGJ and distal esophagus. The esophagus was otherwise normal. STOMACH: The mucosa and folds of the stomach appeared normal. DUODENUM: The duodenal mucosa showed no abnormalities in the bulb and second portion of the duodenum.     Dilation was then performed at the gastroesphageal junction Dilator:Balloon over guidewire Size:14 mm  Reststance:none No additional dilations performed given mucosal tear and no resistacne noted.  COMPLICATIONS: There were no complications.  ENDOSCOPIC IMPRESSION: 1.   Stricture at the gastroesophageal junction 2.   The EGD was otherwise normal.  RECOMMENDATIONS: 1.  anti-reflux regimen long term 2.  PPI qam: omeprazole 40 mg po qam long term, 1 year of refills 3.  post dilation instructions, including clear liquids for 2 hours then soft diet until tomorrow  eSigned:  Meryl DareMalcolm T Stark, MD, [facility]  07/02/2013 3:00 PM

## 2013-07-03 ENCOUNTER — Telehealth: Payer: Self-pay | Admitting: *Deleted

## 2013-07-03 NOTE — Telephone Encounter (Signed)
Name identifier, left message, follow-up 

## 2013-07-04 ENCOUNTER — Ambulatory Visit (INDEPENDENT_AMBULATORY_CARE_PROVIDER_SITE_OTHER): Payer: 59 | Admitting: General Surgery

## 2013-07-04 VITALS — BP 140/76 | HR 70 | Temp 98.0°F | Resp 18 | Ht 74.0 in | Wt 174.0 lb

## 2013-07-04 DIAGNOSIS — K649 Unspecified hemorrhoids: Secondary | ICD-10-CM

## 2013-07-04 MED ORDER — HYDROCORTISONE 2.5 % RE CREA: 1.0000 "application " | TOPICAL_CREAM | Freq: Two times a day (BID) | RECTAL | Status: DC

## 2013-07-04 NOTE — Patient Instructions (Addendum)
HEMORRHOIDS    Did you know... Hemorrhoids are one of the most common ailments known.  More than half the population will develop hemorrhoids, usually after age 45.  Millions of Americans currently suffer from hemorrhoids.  The average person suffers in silence for a long period before seeking medical care.  Today's treatment methods make some types of hemorrhoid removal much less painful.  What are hemorrhoids? Often described as "varicose veins of the anus and rectum", hemorrhoids are enlarged, bulging blood vessels in and about the anus and lower rectum. There are two types of hemorrhoids: external and internal, which refer to their location.  External (outside) hemorrhoids develop near the anus and are covered by very sensitive skin. These are usually painless. However, if a blood clot (thrombosis) develops in an external hemorrhoid, it becomes a painful, hard lump. The external hemorrhoid may bleed if it ruptures. Internal (inside) hemorrhoids develop within the anus beneath the lining. Painless bleeding and protrusion during bowel movements are the most common symptom. However, an internal hemorrhoid can cause severe pain if it is completely "prolapsed" - protrudes from the anal opening and cannot be pushed back inside.   What causes hemorrhoids? An exact cause is unknown; however, the upright posture of humans alone forces a great deal of pressure on the rectal veins, which sometimes causes them to bulge. Other contributing factors include:  . Aging  . Chronic constipation or diarrhea  . Pregnancy  . Heredity  . Straining during bowel movements  . Faulty bowel function due to overuse of laxatives or enemas . Spending long periods of time (e.g., reading) on the toilet  Whatever the cause, the tissues supporting the vessels stretch. As a result, the vessels dilate; their walls become thin and bleed. If the stretching and pressure continue, the weakened vessels protrude.  What are the  symptoms? If you notice any of the following, you could have hemorrhoids:  . Bleeding during bowel movements  . Protrusion during bowel movements . Itching in the anal area  . Pain  . Sensitive lump(s)  How are hemorrhoids treated? Mild symptoms can be relieved frequently by increasing the amount of fiber (e.g., fruits, vegetables, breads and cereals) and fluids in the diet. Eliminating excessive straining reduces the pressure on hemorrhoids and helps prevent them from protruding. A sitz bath - sitting in plain warm water for about 10 minutes - can also provide some relief . With these measures, the pain and swelling of most symptomatic hemorrhoids will decrease in two to seven days, and the firm lump should recede within four to six weeks. In cases of severe or persistent pain from a thrombosed hemorrhoid, your physician may elect to remove the hemorrhoid containing the clot with a small incision. Performed under local anesthesia as an outpatient, this procedure generally provides relief. Severe hemorrhoids may require special treatment, much of which can be performed on an outpatient basis.  . Ligation - the rubber band treatment - works effectively on internal hemorrhoids that protrude with bowel movements. A small rubber band is placed over the hemorrhoid, cutting off its blood supply. The hemorrhoid and the band fall off in a few days and the wound usually heals in a week or two. This procedure sometimes produces mild discomfort and bleeding and may need to be repeated for a full effect.  There is a more intense version of this procedure that is done in the OR as outpatient surgery called THD.  It involves identifying blood vessels leading to the   hemorrhoids and then tying them off with sutures.  This method is a little more painful than rubber band ligation but less painful than traditional hemorrhoidectomy and usually does not have to be repeated.  It is best for internal hemorrhoids that  bleed.  Rubber Band Ligation of Internal Hemorrhoids:  A.  Bulging, bleeding, internal hemorrhoid B.  Rubber band applied at the base of the hemorrhoid C.  About 7 days later, the banded hemorrhoid has fallen off leaving a small scar (arrow)  . Injection and Coagulation can also be used on bleeding hemorrhoids that do not protrude. Both methods are relatively painless and cause the hemorrhoid to shrivel up. . Hemorrhoidectomy - surgery to remove the hemorrhoids - is the most complete method for removal of internal and external hemorrhoids. It is necessary when (1) clots repeatedly form in external hemorrhoids; (2) ligation fails to treat internal hemorrhoids; (3) the protruding hemorrhoid cannot be reduced; or (4) there is persistent bleeding. A hemorrhoidectomy removes excessive tissue that causes the bleeding and protrusion. It is done under anesthesia using sutures, and may, depending upon circumstances, require hospitalization and a period of inactivity. Laser hemorrhoidectomies do not offer any advantage over standard operative techniques. They are also quite expensive, and contrary to popular belief, are no less painful.  Do hemorrhoids lead to cancer? No. There is no relationship between hemorrhoids and cancer. However, the symptoms of hemorrhoids, particularly bleeding, are similar to those of colorectal cancer and other diseases of the digestive system. Therefore, it is important that all symptoms are investigated by a physician specially trained in treating diseases of the colon and rectum and that everyone 50 years or older undergo screening tests for colorectal cancer. Do not rely on over-the-counter medications or other self-treatments. See a colorectal surgeon first so your symptoms can be properly evaluated and effective treatment prescribed.  2012 American Society of Colon & Rectal Surgeons    Fiber Chart  You should 25-30g of fiber per day and drinking 8 glasses of water to help  your bowels move regularly.  In the chart below you can look up how much fiber you are getting in an average day.  If you are not getting enough fiber, you should add a fiber supplement to your diet.  Examples of this include Metamucil, FiberCon and Citrucel.  These can be purchased at your local grocery store or pharmacy.      http://www.[employer].edu/offices/health/nutritioncoach/AtoZ/handouts/Fiber.pdf    GETTING TO GOOD BOWEL HEALTH. Irregular bowel habits such as constipation can lead to many problems over time.  Having one soft bowel movement a day is the most important way to prevent further problems.  The anorectal canal is designed to handle stretching and feces to safely manage our ability to get rid of solid waste (feces, poop, stool) out of our body.  BUT, hard constipated stools can act like ripping concrete bricks causing inflamed hemorrhoids, anal fissures, abdominal pain and bloating.     The goal: ONE SOFT BOWEL MOVEMENT A DAY!  To have soft, regular bowel movements:    Drink at least 8 tall glasses of water a day.     Take plenty of fiber.  Fiber is the undigested part of plant food that passes into the colon, acting s "natures broom" to encourage bowel motility and movement.  Fiber can absorb and hold large amounts of water. This results in a larger, bulkier stool, which is soft and easier to pass. Work gradually over several weeks up to 6 servings a   day of fiber (25g a day even more if needed) in the form of: o Vegetables -- Root (potatoes, carrots, turnips), leafy green (lettuce, salad greens, celery, spinach), or cooked high residue (cabbage, broccoli, etc) o Fruit -- Fresh (unpeeled skin & pulp), Dried (prunes, apricots, cherries, etc ),  or stewed ( applesauce)  o Whole grain breads, pasta, etc (whole wheat)  o Bran cereals    Bulking Agents -- This type of water-retaining fiber generally is easily obtained each day by one of the following:  o Psyllium bran -- The psyllium  plant is remarkable because its ground seeds can retain so much water. This product is available as Metamucil, Konsyl, Effersyllium, Per Diem Fiber, or the less expensive generic preparation in drug and health food stores. Although labeled a laxative, it really is not a laxative.  o Methylcellulose -- This is another fiber derived from wood which also retains water. It is available as Citrucel. o Polyethylene Glycol - and "artificial" fiber commonly called Miralax or Glycolax.  It is helpful for people with gassy or bloated feelings with regular fiber o Flax Seed - a less gassy fiber than psyllium   No reading or other relaxing activity while on the toilet. If bowel movements take longer than 5 minutes, you are too constipated.   AVOID CONSTIPATION.  High fiber and water intake usually takes care of this.  Sometimes a laxative is needed to stimulate more frequent bowel movements, but    Laxatives are not a good long-term solution as it can wear the colon out. o Osmotics (Milk of Magnesia, Fleets phosphosoda, Magnesium citrate, MiraLax, GoLytely) are safer than  o Stimulants (Senokot, Castor Oil, Dulcolax, Ex Lax)    o Do not take laxatives for more than 7days in a row.    IF SEVERELY CONSTIPATED, try a Bowel Retraining Program: o Do not use laxatives.  o Eat a diet high in roughage, such as bran cereals and leafy vegetables.  o Drink six (6) ounces of prune or apricot juice each morning.  o Eat two (2) large servings of stewed fruit each day.  o Take one (1) heaping tablespoon of a psyllium-based bulking agent twice a day. Use sugar-free sweetener when possible to avoid excessive calories.  o Eat a normal breakfast.  o Set aside 15 minutes after breakfast to sit on the toilet, but do not strain to have a bowel movement.  o If you do not have a bowel movement by the third day, use an enema and repeat the above steps.    

## 2013-07-04 NOTE — Progress Notes (Signed)
Chief Complaint  Patient presents with  . New Evaluation    HEMs    HISTORY: Jacob Buchanan is a 43 y.o. male who presents to the office with anal bleeding.  Other symptoms include nothing.  This had been occurring for the last few years.  He has tried nothing in the past.  Nothing makes the symptoms worse.   It is intermittent in nature.  His bowel habits are regular and his bowel movements are usually soft.  His fiber intake is dietary.  He last colonoscopy was about a year ago and was normal except hemorrhoids per pt.       Past Medical History  Diagnosis Date  . Gilbert syndrome   . Asthma     childhood  . Hyperlipidemia   . Internal hemorrhoids   . GERD (gastroesophageal reflux disease)       Past Surgical History  Procedure Laterality Date  . Finger surgery      post crush injury, right index  . Wisdom tooth extraction    . Inguinal hernia repair  1972    bilateral  . Flexible sigmoidoscopy  2001    Internal Hemorrhoids  . Colonoscopy  2004 ; 2007; & 2014    Int Hem , Dr Russella DarStark        Current Outpatient Prescriptions  Medication Sig Dispense Refill  . loratadine (CLARITIN) 10 MG tablet Take 10 mg by mouth daily as needed for allergies.      Marland Kitchen. omeprazole (PRILOSEC) 40 MG capsule Take 1 capsule (40 mg total) by mouth every morning.  30 capsule  11   No current facility-administered medications for this visit.      No Known Allergies    Family History  Problem Relation Age of Onset  . Lung cancer Maternal Grandfather   . Diabetes Sister   . Mitral valve prolapse Father   . Ulcers Sister     peptic  . Emphysema Paternal Grandfather   . Lung cancer Paternal Grandfather   . Stroke Neg Hx   . Heart disease Neg Hx     History   Social History  . Marital Status: Divorced    Spouse Name: N/A    Number of Children: 0  . Years of Education: N/A   Occupational History  . landscaper-self  employed    Social History Main Topics  . Smoking status: Never  Smoker   . Smokeless tobacco: Never Used  . Alcohol Use: 3.6 oz/week    6 Cans of beer per week  . Drug Use: No  . Sexual Activity: Not on file   Other Topics Concern  . Not on file   Social History Narrative  . No narrative on file      REVIEW OF SYSTEMS - PERTINENT POSITIVES ONLY: Review of Systems - General ROS: negative for - chills, fever or weight loss Hematological and Lymphatic ROS: negative for - bleeding problems, blood clots or bruising Respiratory ROS: no cough, shortness of breath, or wheezing Cardiovascular ROS: no chest pain or dyspnea on exertion Gastrointestinal ROS: positive for - blood in stools negative for - abdominal pain, change in bowel habits or constipation Genito-Urinary ROS: no dysuria, trouble voiding, or hematuria  EXAM: Filed Vitals:   07/04/13 1126  BP: 140/76  Pulse: 70  Temp: 98 F (36.7 C)  Resp: 18    General appearance: alert and cooperative Resp: clear to auscultation bilaterally Cardio: regular rate and rhythm GI: normal findings: soft, non-tender  Procedure: Anoscopy Surgeon: Maisie Fushomas Diagnosis: anal pain  Assistant: Hendricks After the risks and benefits were explained, verbal consent was obtained for above procedure  Anesthesia: none Findings: grade 1 internal hemorrhoids, inflamed L lat ext hemorrhoid and Rant skin tag noted.      ASSESSMENT AND PLAN:  Jacob AltoStephen C Strada is a 43 y.o. M with hemorrhoids. On exam, his external disease is prevalent but he has minimal internal disease.  I have recommended anusol ointment and a fiber supplement for this. I will reassess this in 2 months. If he continue to have problems with pain or bleeding, I will discuss surgical options with him.        Vanita PandaAlicia C Galadriel Shroff, MD Colon and Rectal Surgery / General Surgery [facility]      Visit Diagnoses: No diagnosis found.  Primary Care Physician: Marga MelnickWilliam Hopper, MD

## 2013-08-27 ENCOUNTER — Encounter (INDEPENDENT_AMBULATORY_CARE_PROVIDER_SITE_OTHER): Payer: 59 | Admitting: General Surgery

## 2013-09-25 ENCOUNTER — Telehealth: Payer: Self-pay | Admitting: Internal Medicine

## 2013-09-25 NOTE — Telephone Encounter (Signed)
Patient states that Dr. Alwyn RenHopper has seen him for a torn rotator cuff.  [facility] Dr. Shelle IronBeane.  He then found out that he needed a referral.  He is requesting Dr. Alwyn RenHopper to send a referral over.  Please advise.

## 2013-09-25 NOTE — Telephone Encounter (Signed)
He is also requesting a referral to his dermatologist - Dr. Nita SellsJohn Hall [facility]

## 2013-09-25 NOTE — Telephone Encounter (Signed)
Ortho referral OK with torn rotator cuff dx Need dx for derm refrral

## 2013-09-25 NOTE — Telephone Encounter (Signed)
Patient is at Dermatologist now and request referral dated for today.   Please advise.

## 2013-09-27 ENCOUNTER — Telehealth: Payer: Self-pay | Admitting: *Deleted

## 2013-09-27 DIAGNOSIS — K222 Esophageal obstruction: Secondary | ICD-10-CM

## 2013-09-27 MED ORDER — OMEPRAZOLE 40 MG PO CPDR: 40.0000 mg | DELAYED_RELEASE_CAPSULE | ORAL | Status: DC

## 2013-09-27 NOTE — Telephone Encounter (Signed)
Both referrals were previously processed and approved by [facility].

## 2013-09-27 NOTE — Telephone Encounter (Signed)
Left msg on triage stating he forgot his prilosec he is on vacation. [facility] [address]. Notified pt rx sent to pharmacy....Raechel Chute/lmb

## 2013-10-01 NOTE — Telephone Encounter (Signed)
Referral to Dr. Nita SellsJohn Hall auth # [id number] valid 09/25/13-03/28/14 for 6 visits

## 2013-12-19 ENCOUNTER — Other Ambulatory Visit: Payer: Self-pay | Admitting: Orthopedic Surgery

## 2013-12-26 ENCOUNTER — Encounter (HOSPITAL_BASED_OUTPATIENT_CLINIC_OR_DEPARTMENT_OTHER): Payer: Self-pay | Admitting: *Deleted

## 2013-12-27 ENCOUNTER — Encounter (HOSPITAL_BASED_OUTPATIENT_CLINIC_OR_DEPARTMENT_OTHER): Payer: Self-pay | Admitting: *Deleted

## 2013-12-27 NOTE — Progress Notes (Signed)
NPO AFTER MN WITH EXCEPTION CLEAR LIQUIDS UNTIL 0700 (NO CREAM/ MILK PRODUCTS).  ARRIVE AT 1100. NEEDS HG. WILL TAKE PRILOSEC AM DOS W/ SIPS OF WATER.

## 2013-12-31 ENCOUNTER — Encounter (HOSPITAL_BASED_OUTPATIENT_CLINIC_OR_DEPARTMENT_OTHER)
Admission: RE | Disposition: A | Discharge status: Home / Self Care | Payer: Self-pay | Source: Ambulatory Visit | Attending: Specialist

## 2013-12-31 ENCOUNTER — Encounter (HOSPITAL_BASED_OUTPATIENT_CLINIC_OR_DEPARTMENT_OTHER): Payer: Self-pay | Admitting: *Deleted

## 2013-12-31 ENCOUNTER — Encounter (HOSPITAL_BASED_OUTPATIENT_CLINIC_OR_DEPARTMENT_OTHER): Payer: 59 | Admitting: Anesthesiology

## 2013-12-31 ENCOUNTER — Ambulatory Visit (HOSPITAL_BASED_OUTPATIENT_CLINIC_OR_DEPARTMENT_OTHER): Payer: 59 | Admitting: Anesthesiology

## 2013-12-31 ENCOUNTER — Ambulatory Visit (HOSPITAL_BASED_OUTPATIENT_CLINIC_OR_DEPARTMENT_OTHER)
Admission: RE | Admit: 2013-12-31 | Discharge: 2013-12-31 | Disposition: A | Discharge status: Home / Self Care | Payer: 59 | Source: Ambulatory Visit | Attending: Specialist | Admitting: Specialist

## 2013-12-31 DIAGNOSIS — Z79899 Other long term (current) drug therapy: Secondary | ICD-10-CM | POA: Insufficient documentation

## 2013-12-31 DIAGNOSIS — Z9889 Other specified postprocedural states: Secondary | ICD-10-CM

## 2013-12-31 DIAGNOSIS — E785 Hyperlipidemia, unspecified: Secondary | ICD-10-CM | POA: Insufficient documentation

## 2013-12-31 DIAGNOSIS — N529 Male erectile dysfunction, unspecified: Secondary | ICD-10-CM | POA: Insufficient documentation

## 2013-12-31 DIAGNOSIS — M24211 Disorder of ligament, right shoulder: Secondary | ICD-10-CM | POA: Insufficient documentation

## 2013-12-31 DIAGNOSIS — M24411 Recurrent dislocation, right shoulder: Secondary | ICD-10-CM | POA: Diagnosis not present

## 2013-12-31 DIAGNOSIS — I4510 Unspecified right bundle-branch block: Secondary | ICD-10-CM | POA: Diagnosis not present

## 2013-12-31 DIAGNOSIS — K219 Gastro-esophageal reflux disease without esophagitis: Secondary | ICD-10-CM | POA: Diagnosis not present

## 2013-12-31 DIAGNOSIS — M25511 Pain in right shoulder: Secondary | ICD-10-CM | POA: Diagnosis present

## 2013-12-31 HISTORY — DX: Male erectile dysfunction, unspecified: N52.9

## 2013-12-31 HISTORY — DX: Unspecified right bundle-branch block: I45.10

## 2013-12-31 HISTORY — DX: Incomplete rotator cuff tear or rupture of right shoulder, not specified as traumatic: M75.111

## 2013-12-31 HISTORY — PX: SHOULDER ARTHROSCOPY WITH LABRAL REPAIR: SHX5691

## 2013-12-31 LAB — POCT HEMOGLOBIN-HEMACUE: Hemoglobin: 13.1 g/dL (ref 13.0–17.0)

## 2013-12-31 SURGERY — ARTHROSCOPY, SHOULDER, WITH GLENOID LABRUM REPAIR
Anesthesia: General | Site: Shoulder | Laterality: Right

## 2013-12-31 MED ORDER — MIDAZOLAM HCL 5 MG/5ML IJ SOLN
INTRAMUSCULAR | Status: DC | PRN
  Administered 2013-12-31: 2 mg via INTRAVENOUS

## 2013-12-31 MED ORDER — ROCURONIUM BROMIDE 100 MG/10ML IV SOLN
INTRAVENOUS | Status: DC | PRN
  Administered 2013-12-31: 5 mg via INTRAVENOUS
  Administered 2013-12-31: 45 mg via INTRAVENOUS

## 2013-12-31 MED ORDER — MIDAZOLAM HCL 2 MG/2ML IJ SOLN
2.0000 mg | Freq: Once | INTRAMUSCULAR | Status: AC
  Administered 2013-12-31: 2 mg via INTRAVENOUS
  Filled 2013-12-31: qty 2

## 2013-12-31 MED ORDER — PROMETHAZINE HCL 25 MG/ML IJ SOLN
6.2500 mg | INTRAMUSCULAR | Status: DC | PRN
  Filled 2013-12-31: qty 1

## 2013-12-31 MED ORDER — OXYCODONE-ACETAMINOPHEN 5-325 MG PO TABS: 1.0000 | ORAL_TABLET | ORAL | Status: DC | PRN

## 2013-12-31 MED ORDER — FENTANYL CITRATE 0.05 MG/ML IJ SOLN
INTRAMUSCULAR | Status: DC | PRN
  Administered 2013-12-31: 50 ug via INTRAVENOUS
  Administered 2013-12-31: 100 ug via INTRAVENOUS

## 2013-12-31 MED ORDER — SODIUM CHLORIDE 0.9 % IJ SOLN
INTRAMUSCULAR | Status: DC | PRN
  Administered 2013-12-31: 20 mL

## 2013-12-31 MED ORDER — NEOSTIGMINE METHYLSULFATE 10 MG/10ML IV SOLN
INTRAVENOUS | Status: DC | PRN
  Administered 2013-12-31: 3 mg via INTRAVENOUS

## 2013-12-31 MED ORDER — DEXAMETHASONE SODIUM PHOSPHATE 4 MG/ML IJ SOLN
INTRAMUSCULAR | Status: DC | PRN
  Administered 2013-12-31: 10 mg via INTRAVENOUS

## 2013-12-31 MED ORDER — MIDAZOLAM HCL 2 MG/2ML IJ SOLN
INTRAMUSCULAR | Status: AC
  Filled 2013-12-31: qty 2

## 2013-12-31 MED ORDER — POVIDONE-IODINE 7.5 % EX SOLN
Freq: Once | CUTANEOUS | Status: DC
  Filled 2013-12-31: qty 118

## 2013-12-31 MED ORDER — GLYCOPYRROLATE 0.2 MG/ML IJ SOLN
INTRAMUSCULAR | Status: DC | PRN
  Administered 2013-12-31: 0.4 mg via INTRAVENOUS
  Administered 2013-12-31: 0.2 mg via INTRAVENOUS

## 2013-12-31 MED ORDER — BUPIVACAINE HCL (PF) 0.25 % IJ SOLN
INTRAMUSCULAR | Status: DC | PRN
  Administered 2013-12-31: 20 mL

## 2013-12-31 MED ORDER — CEFAZOLIN SODIUM-DEXTROSE 2-3 GM-% IV SOLR
2.0000 g | INTRAVENOUS | Status: AC
  Administered 2013-12-31: 2 g via INTRAVENOUS
  Filled 2013-12-31: qty 50

## 2013-12-31 MED ORDER — DEXTROSE 5 % IV SOLN
10.0000 mg | INTRAVENOUS | Status: DC | PRN
  Administered 2013-12-31: 25 ug/min via INTRAVENOUS

## 2013-12-31 MED ORDER — FENTANYL CITRATE 0.05 MG/ML IJ SOLN
25.0000 ug | INTRAMUSCULAR | Status: DC | PRN
  Filled 2013-12-31: qty 1

## 2013-12-31 MED ORDER — FENTANYL CITRATE 0.05 MG/ML IJ SOLN
100.0000 ug | Freq: Once | INTRAMUSCULAR | Status: AC
  Administered 2013-12-31: 100 ug via INTRAVENOUS
  Filled 2013-12-31: qty 2

## 2013-12-31 MED ORDER — ROPIVACAINE HCL 5 MG/ML IJ SOLN
INTRAMUSCULAR | Status: DC | PRN
  Administered 2013-12-31: 30 mL via PERINEURAL

## 2013-12-31 MED ORDER — LACTATED RINGERS IV SOLN
INTRAVENOUS | Status: DC
  Administered 2013-12-31 (×2): via INTRAVENOUS
  Filled 2013-12-31: qty 1000

## 2013-12-31 MED ORDER — PROPOFOL 10 MG/ML IV BOLUS
INTRAVENOUS | Status: DC | PRN
  Administered 2013-12-31: 200 mg via INTRAVENOUS

## 2013-12-31 MED ORDER — ONDANSETRON HCL 4 MG/2ML IJ SOLN
INTRAMUSCULAR | Status: DC | PRN
  Administered 2013-12-31: 4 mg via INTRAVENOUS

## 2013-12-31 MED ORDER — LIDOCAINE HCL (CARDIAC) 20 MG/ML IV SOLN
INTRAVENOUS | Status: DC | PRN
  Administered 2013-12-31: 100 mg via INTRAVENOUS

## 2013-12-31 MED ORDER — FENTANYL CITRATE 0.05 MG/ML IJ SOLN
INTRAMUSCULAR | Status: AC
  Filled 2013-12-31: qty 4

## 2013-12-31 MED ORDER — SODIUM CHLORIDE 0.9 % IR SOLN
Status: DC | PRN
  Administered 2013-12-31: 12000 mL

## 2013-12-31 MED ORDER — CEPHALEXIN 500 MG PO CAPS: 500.0000 mg | ORAL_CAPSULE | Freq: Three times a day (TID) | ORAL | Status: DC

## 2013-12-31 MED ORDER — METHOCARBAMOL 500 MG PO TABS: 500.0000 mg | ORAL_TABLET | Freq: Four times a day (QID) | ORAL | Status: DC | PRN

## 2013-12-31 MED ORDER — FENTANYL CITRATE 0.05 MG/ML IJ SOLN
INTRAMUSCULAR | Status: AC
  Filled 2013-12-31: qty 2

## 2013-12-31 MED ORDER — EPINEPHRINE HCL 1 MG/ML IJ SOLN
INTRAMUSCULAR | Status: DC | PRN
  Administered 2013-12-31: 2 mg

## 2013-12-31 MED ORDER — CEFAZOLIN SODIUM-DEXTROSE 2-3 GM-% IV SOLR
INTRAVENOUS | Status: AC
  Filled 2013-12-31: qty 50

## 2013-12-31 SURGICAL SUPPLY — 84 items
ANCHOR PUSHLOCK BIOCOMP 2.9X15 (Orthopedic Implant) ×6 IMPLANT
BLADE CUDA 4.2 (BLADE) IMPLANT
BLADE CUDA GRT WHITE 3.5 (BLADE) ×2 IMPLANT
BLADE CUTTER GATOR 3.5 (BLADE) IMPLANT
BLADE GREAT WHITE 4.2 (BLADE) ×2 IMPLANT
BLADE SURG 11 STRL SS (BLADE) ×2 IMPLANT
BLADE SURG 15 STRL LF DISP TIS (BLADE) ×1 IMPLANT
BLADE SURG 15 STRL SS (BLADE) ×1
BUR 3.5 LG SPHERICAL (BURR) ×1 IMPLANT
BUR OVAL 6.0 (BURR) ×2 IMPLANT
BURR 3.5 LG SPHERICAL (BURR) ×2
CANISTER SUCT LVC 12 LTR MEDI- (MISCELLANEOUS) ×6 IMPLANT
CANISTER SUCTION 2500CC (MISCELLANEOUS) IMPLANT
CANNULA 5.75X7 CRYSTAL CLEAR (CANNULA) ×2 IMPLANT
CANNULA 5.75X71 LONG (CANNULA) ×2 IMPLANT
CANNULA TWIST IN 8.25X7CM (CANNULA) ×4 IMPLANT
CLOTH BEACON ORANGE TIMEOUT ST (SAFETY) IMPLANT
COVER MAYO STAND STRL (DRAPES) ×2 IMPLANT
COVER TABLE BACK 60X90 (DRAPES) ×2 IMPLANT
DISTRACTOR SHOULDER 3 POINT (INSTRUMENTS) ×2 IMPLANT
DRAPE LG THREE QUARTER DISP (DRAPES) ×2 IMPLANT
DRAPE ORTHO SPLIT 77X108 STRL (DRAPES) ×2
DRAPE POUCH INSTRU U-SHP 10X18 (DRAPES) ×2 IMPLANT
DRAPE STERI 35X30 U-POUCH (DRAPES) ×2 IMPLANT
DRAPE SURG 17X23 STRL (DRAPES) ×2 IMPLANT
DRAPE SURG ORHT 6 SPLT 77X108 (DRAPES) ×2 IMPLANT
DRAPE U-SHAPE 47X51 STRL (DRAPES) ×2 IMPLANT
DRSG PAD ABDOMINAL 8X10 ST (GAUZE/BANDAGES/DRESSINGS) ×2 IMPLANT
DURAPREP 26ML APPLICATOR (WOUND CARE) ×2 IMPLANT
ELECT MENISCUS 165MM 90D (ELECTRODE) IMPLANT
ELECT REM PT RETURN 9FT ADLT (ELECTROSURGICAL) ×2
ELECTRODE REM PT RTRN 9FT ADLT (ELECTROSURGICAL) ×1 IMPLANT
FIBERSTICK 2 (SUTURE) ×4 IMPLANT
GAUZE XEROFORM 1X8 LF (GAUZE/BANDAGES/DRESSINGS) ×2 IMPLANT
GLOVE BIO SURGEON STRL SZ 6.5 (GLOVE) ×2 IMPLANT
GLOVE BIO SURGEON STRL SZ7.5 (GLOVE) ×4 IMPLANT
GLOVE INDICATOR 6.5 STRL GRN (GLOVE) ×2 IMPLANT
GLOVE INDICATOR 8.0 STRL GRN (GLOVE) ×4 IMPLANT
GLOVE SURG ORTHO 8.0 STRL STRW (GLOVE) ×2 IMPLANT
GOWN STRL REUS W/ TWL LRG LVL3 (GOWN DISPOSABLE) ×1 IMPLANT
GOWN STRL REUS W/ TWL XL LVL3 (GOWN DISPOSABLE) ×2 IMPLANT
GOWN STRL REUS W/TWL LRG LVL3 (GOWN DISPOSABLE) ×1
GOWN STRL REUS W/TWL XL LVL3 (GOWN DISPOSABLE) ×2
KIT PUSHLOCK 2.9 HIP (KITS) ×2 IMPLANT
KIT SHOULDER TRACTION (DRAPES) ×2 IMPLANT
LASSO 90 CVE QUICKPAS (DISPOSABLE) ×2 IMPLANT
LASSO CRESCENT QUICKPASS (SUTURE) ×2 IMPLANT
LASSO SUT 90 DEGREE (SUTURE) IMPLANT
NEEDLE 1/2 CIR CATGUT .05X1.09 (NEEDLE) IMPLANT
NEEDLE HYPO 22GX1.5 SAFETY (NEEDLE) ×2 IMPLANT
NEEDLE SCORPION MULTI FIRE (NEEDLE) ×2 IMPLANT
NEEDLE SPNL 18GX3.5 QUINCKE PK (NEEDLE) ×2 IMPLANT
NS IRRIG 500ML POUR BTL (IV SOLUTION) ×2 IMPLANT
PACK BASIN DAY SURGERY FS (CUSTOM PROCEDURE TRAY) ×2 IMPLANT
PAD ABD 8X10 STRL (GAUZE/BANDAGES/DRESSINGS) ×2 IMPLANT
PENCIL BUTTON HOLSTER BLD 10FT (ELECTRODE) ×2 IMPLANT
SET ARTHROSCOPY TUBING (MISCELLANEOUS) ×1
SET ARTHROSCOPY TUBING PVC (MISCELLANEOUS) ×1 IMPLANT
SLING ULTRA II AB L (ORTHOPEDIC SUPPLIES) IMPLANT
SLING ULTRA II AB S (ORTHOPEDIC SUPPLIES) IMPLANT
SPONGE GAUZE 4X4 12PLY (GAUZE/BANDAGES/DRESSINGS) IMPLANT
SPONGE GAUZE 4X4 12PLY STER LF (GAUZE/BANDAGES/DRESSINGS) ×2 IMPLANT
SPONGE LAP 4X18 X RAY DECT (DISPOSABLE) ×2 IMPLANT
SUCTION FRAZIER TIP 10 FR DISP (SUCTIONS) IMPLANT
SUT 2 FIBERLOOP 20 STRT BLUE (SUTURE)
SUT ETHILON 3 0 PS 1 (SUTURE) ×2 IMPLANT
SUT FIBERWIRE #2 38 T-5 BLUE (SUTURE)
SUT LASSO 45 DEGREE LEFT (SUTURE) IMPLANT
SUT LASSO 45D RIGHT (SUTURE) IMPLANT
SUT PDS AB 1 CT1 27 (SUTURE) IMPLANT
SUT TIGER TAPE 7 IN WHITE (SUTURE) IMPLANT
SUT VIC AB 0 CT1 36 (SUTURE) IMPLANT
SUT VIC AB 2-0 CT1 27 (SUTURE)
SUT VIC AB 2-0 CT1 TAPERPNT 27 (SUTURE) IMPLANT
SUTURE 2 FIBERLOOP 20 STRT BLU (SUTURE) IMPLANT
SUTURE FIBERWR #2 38 T-5 BLUE (SUTURE) IMPLANT
SYR 20CC LL (SYRINGE) ×2 IMPLANT
SYR CONTROL 10ML LL (SYRINGE) ×2 IMPLANT
TAPE LABRALWHITE 1.5X36 (TAPE) ×6 IMPLANT
TOWEL OR 17X24 6PK STRL BLUE (TOWEL DISPOSABLE) ×4 IMPLANT
TUBE CONNECTING 12X1/4 (SUCTIONS) ×4 IMPLANT
WAND 90 DEG TURBOVAC W/CORD (SURGICAL WAND) ×2 IMPLANT
WATER STERILE IRR 500ML POUR (IV SOLUTION) ×2 IMPLANT
YANKAUER SUCT BULB TIP NO VENT (SUCTIONS) IMPLANT

## 2013-12-31 NOTE — Op Note (Signed)
Dictated#[id number]

## 2013-12-31 NOTE — Interval H&P Note (Signed)
History and Physical Interval Note:  12/31/2013 12:04 PM  Quintin AltoStephen C Marlowe  has presented today for surgery, with the diagnosis of right shoulder partial rotator cuff and labral tear  The various methods of treatment have been discussed with the patient and family. After consideration of risks, benefits and other options for treatment, the patient has consented to  Procedure(s): RIGHT SHOULDER ARTHROSCOPY WITH LABRAL REPAIR VS DEBRIDEMENT/STABLIZATION AND POSSIBLE ROTATOR CUFF REPAIR (Right) as a surgical intervention .  The patient's history has been reviewed, patient examined, no change in status, stable for surgery.  I have reviewed the patient's chart and labs.  Questions were answered to the patient's satisfaction.     Jacob Buchanan ANDREW

## 2013-12-31 NOTE — Transfer of Care (Signed)
Immediate Anesthesia Transfer of Care Note  Patient: Jacob Buchanan  Procedure(s) Performed: Procedure(s): RIGHT SHOULDER ARTHROSCOPY WITH LABRAL REPAIR  (Right)  Patient Location: PACU  Anesthesia Type:General  Level of Consciousness: awake, alert , oriented and patient cooperative  Airway & Oxygen Therapy: Patient Spontanous Breathing and Patient connected to nasal cannula oxygen  Post-op Assessment: Report given to PACU RN and Post -op Vital signs reviewed and stable  Post vital signs: Reviewed and stable  Complications: No apparent anesthesia complications

## 2013-12-31 NOTE — H&P (Signed)
Jacob Buchanan is an 43 y.o. male.   Chief Complaint: Right shoulder pain HPI: Patient presents with joint discomfort that had been persistent for several years now. Despite conservative treatments, his discomfort has not improved. Imaging was obtained. Other conservative and surgical treatments were discussed in detail. Patient wishes to proceed with surgery as consented. Denies SOB, CP, or calf pain. No Fever, chills, or nausea/ vomiting.   Past Medical History  Diagnosis Date  . Gilbert syndrome   . Hyperlipidemia   . GERD (gastroesophageal reflux disease)   . ED (erectile dysfunction)   . Partial tear of right rotator cuff   . Wears contact lenses   . Incomplete right bundle branch block     Past Surgical History  Procedure Laterality Date  . Finger surgery  1994    repair/ reconstruction right index crush injury  . Inguinal hernia repair Bilateral age 89  . Colonoscopy  2004 ; 2007; & 05-29-2012    last one with propofol and hemorroidectomy with sclerosing    Family History  Problem Relation Age of Onset  . Lung cancer Maternal Grandfather   . Diabetes Sister   . Mitral valve prolapse Father   . Ulcers Sister     peptic  . Emphysema Paternal Grandfather   . Lung cancer Paternal Grandfather   . Stroke Neg Hx   . Heart disease Neg Hx    Social History:  reports that he has never smoked. He has never used smokeless tobacco. He reports that he drinks about 7 ounces of alcohol per week. He reports that he does not use illicit drugs.  Allergies: No Known Allergies  Medications Prior to Admission  Medication Sig Dispense Refill  . loratadine (CLARITIN) 10 MG tablet Take 10 mg by mouth daily as needed for allergies.      Marland Kitchen. omeprazole (PRILOSEC) 40 MG capsule Take 1 capsule (40 mg total) by mouth every morning.  30 capsule  0    Results for orders placed during the hospital encounter of 12/31/13 (from the past 48 hour(s))  POCT HEMOGLOBIN-HEMACUE     Status: None   Collection Time    12/31/13 10:29 AM      Result Value Ref Range   Hemoglobin 13.1  13.0 - 17.0 g/dL   No results found.  Review of Systems  Constitutional: Negative.   HENT: Negative.   Eyes: Negative.   Respiratory: Negative.   Cardiovascular: Negative.   Gastrointestinal: Negative.   Genitourinary: Negative.   Musculoskeletal: Positive for joint pain (Right shoulder).  Skin: Negative.   Neurological: Negative.   Endo/Heme/Allergies: Negative.   Psychiatric/Behavioral: Negative.     Blood pressure 128/77, pulse 51, temperature 98 F (36.7 C), temperature source Oral, resp. rate 16, height 6\' 2"  (1.88 m), weight 76.885 kg (169 lb 8 oz), SpO2 100.00%. Physical Exam  Constitutional: He is oriented to person, place, and time. He appears well-developed.  HENT:  Head: Normocephalic.  Eyes: EOM are normal.  Neck: Normal range of motion.  Cardiovascular: Normal rate, normal heart sounds and intact distal pulses.   Respiratory: Effort normal.  GI: Soft.  Genitourinary:  Deferred  Musculoskeletal:  Right shoulder pain  Neurological: He is alert and oriented to person, place, and time.  Skin: Skin is warm and dry.  Psychiatric: His behavior is normal.     Assessment/Plan Right shoulder RCT and labral tear: Right shoulder scope today as consented D/c home Follow instructions  Jacob Buchanan L 12/31/2013, 11:38 AM

## 2013-12-31 NOTE — Discharge Instructions (Signed)
Regional Anesthesia Blocks ° °1. Numbness or the inability to move the "blocked" extremity may last from 3-48 hours after placement. The length of time depends on the medication injected and your individual response to the medication. If the numbness is not going away after 48 hours, call your surgeon. ° °2. The extremity that is blocked will need to be protected until the numbness is gone and the  Strength has returned. Because you cannot feel it, you will need to take extra care to avoid injury. Because it may be weak, you may have difficulty moving it or using it. You may not know what position it is in without looking at it while the block is in effect. ° °3. For blocks in the legs and feet, returning to weight bearing and walking needs to be done carefully. You will need to wait until the numbness is entirely gone and the strength has returned. You should be able to move your leg and foot normally before you try and bear weight or walk. You will need someone to be with you when you first try to ensure you do not fall and possibly risk injury. ° °4. Bruising and tenderness at the needle site are common side effects and will resolve in a few days. ° °5. Persistent numbness or new problems with movement should be communicated to the surgeon or the [facility] ([phone])/ [facility] ([phone]). ° ° ° °Post Anesthesia Home Care Instructions ° °Activity: °Get plenty of rest for the remainder of the day. A responsible adult should stay with you for 24 hours following the procedure.  °For the next 24 hours, DO NOT: °-Drive a car °-Operate machinery °-Drink alcoholic beverages °-Take any medication unless instructed by your physician °-Make any legal decisions or sign important papers. ° °Meals: °Start with liquid foods such as gelatin or soup. Progress to regular foods as tolerated. Avoid greasy, spicy, heavy foods. If nausea and/or vomiting occur, drink only clear liquids until the  nausea and/or vomiting subsides. Call your physician if vomiting continues. ° °Special Instructions/Symptoms: °Your throat may feel dry or sore from the anesthesia or the breathing tube placed in your throat during surgery. If this causes discomfort, gargle with warm salt water. The discomfort should disappear within 24 hours. ° °

## 2013-12-31 NOTE — Anesthesia Procedure Notes (Addendum)
Anesthesia Regional Block:  Interscalene brachial plexus block  Pre-Anesthetic Checklist: ,, timeout performed, Correct Patient, Correct Site, Correct Laterality, Correct Procedure, Correct Position, site marked, Risks and benefits discussed,  Surgical consent,  Pre-op evaluation,  At surgeon's request and post-op pain management  Laterality: Right and Upper  Prep: chloraprep       Needles:   Needle Type: Stimulator Needle - 80      Needle Gauge: 21 and 21 G    Additional Needles:  Procedures: ultrasound guided (picture in chart) and nerve stimulator Interscalene brachial plexus block  Nerve Stimulator or Paresthesia:  Response: Thumb, 0.6 mA,   Additional Responses:   Narrative:  Start time: 12/31/2013 11:00 AM End time: 12/31/2013 11:10 AM  Performed by: Personally  Anesthesiologist: denenny  Additional Notes:  No pain on injection. No increased resistance to injection.  Motor intact immediately after block. Loss of deltoid function at 20 minutes. Meaningful verbal contact maintained throughout block procedure.     Procedure Name: Intubation Date/Time: 12/31/2013 12:16 PM Performed by: Tyrone NineSAUVE, Branton Einstein F Pre-anesthesia Checklist: Patient identified, Timeout performed, Emergency Drugs available, Suction available and Patient being monitored Patient Re-evaluated:Patient Re-evaluated prior to inductionOxygen Delivery Method: Circle system utilized Preoxygenation: Pre-oxygenation with 100% oxygen Intubation Type: IV induction and Cricoid Pressure applied Ventilation: Mask ventilation without difficulty and Oral airway inserted - appropriate to patient size Laryngoscope Size: Hyacinth MeekerMiller and Mac Grade View: Grade III Tube type: Oral Tube size: 8.0 mm Number of attempts: 2 Airway Equipment and Method: Stylet Secured at: 22 cm Tube secured with: Tape Dental Injury: Teeth and Oropharynx as per pre-operative assessment  Comments: Laryngoscopy X 1 with Mac #3    Mac #4 able to  lift epiglottis better  VSS stable throughout- [employer] Reshunda Strider CRNA

## 2013-12-31 NOTE — Anesthesia Preprocedure Evaluation (Addendum)
Anesthesia Evaluation  Patient identified by MRN, date of birth, ID band Patient awake    Reviewed: Allergy & Precautions, H&P , NPO status , Patient's Chart, lab work & pertinent test results  Airway Mallampati: II  TM Distance: >3 FB Neck ROM: Full    Dental  (+) Teeth Intact, Dental Advisory Given,    Pulmonary neg pulmonary ROS,  breath sounds clear to auscultation  Pulmonary exam normal       Cardiovascular Exercise Tolerance: Good negative cardio ROS  + dysrhythmias Rhythm:Regular Rate:Normal     Neuro/Psych negative neurological ROS  negative psych ROS   GI/Hepatic Neg liver ROS, GERD-  Medicated,  Endo/Other  negative endocrine ROS  Renal/GU negative Renal ROS  negative genitourinary   Musculoskeletal negative musculoskeletal ROS (+)   Abdominal   Peds negative pediatric ROS (+)  Hematology negative hematology ROS (+)   Anesthesia Other Findings   Reproductive/Obstetrics negative OB ROS                           Anesthesia Physical Anesthesia Plan  ASA: II  Anesthesia Plan: General   Post-op Pain Management:    Induction: Intravenous  Airway Management Planned: Oral ETT  Additional Equipment:   Intra-op Plan:   Post-operative Plan: Extubation in OR  Informed Consent: I have reviewed the patients History and Physical, chart, labs and discussed the procedure including the risks, benefits and alternatives for the proposed anesthesia with the patient or authorized representative who has indicated his/her understanding and acceptance.   Dental advisory given  Plan Discussed with: CRNA  Anesthesia Plan Comments: (Discussed risks and benefits of interscalene block including failure, bleeding, infection, nerve damage, weakness. Questions answered. Patient consents to block.)        Anesthesia Quick Evaluation

## 2013-12-31 NOTE — Anesthesia Postprocedure Evaluation (Signed)
Anesthesia Post Note  Patient: Jacob Buchanan  Procedure(s) Performed: Procedure(s) (LRB): RIGHT SHOULDER ARTHROSCOPY WITH LABRAL REPAIR  (Right)  Anesthesia type: General  Patient location: PACU  Post pain: Pain level controlled  Post assessment: Post-op Vital signs reviewed  Last Vitals: BP 126/62  Pulse 66  Temp(Src) 36.7 C (Oral)  Resp 17  Ht 6\' 2"  (1.88 m)  Wt 169 lb 8 oz (76.885 kg)  BMI 21.75 kg/m2  SpO2 97%  Post vital signs: Reviewed  Level of consciousness: sedated  Complications: No apparent anesthesia complications

## 2014-01-01 ENCOUNTER — Encounter (HOSPITAL_BASED_OUTPATIENT_CLINIC_OR_DEPARTMENT_OTHER): Payer: Self-pay | Admitting: Specialist

## 2014-01-02 NOTE — Op Note (Signed)
NAME:  Jacob Buchanan, Jacob Buchanan NO.:  [id number]  MEDICAL RECORD NO.:  [id number]  LOCATION:                                 FACILITY:  PHYSICIAN:  Cynda Familia, M.D. DATE OF BIRTH:  DATE OF PROCEDURE:  12/31/2013 DATE OF DISCHARGE:                              OPERATIVE REPORT   PREOPERATIVE DIAGNOSIS:  Right shoulder recurrent traumatic anterior dislocation.  POSTOPERATIVE DIAGNOSES:  Right shoulder recurrent traumatic anterior dislocation plus Hill-Sachs lesion, anterior labral tear, capsular redundancy, capsular laxity.  PROCEDURE:  Right shoulder glenohumeral arthroscopy with capsulolabral reconstruction, stabilization labral repair.  SURGEON:  Cynda Familia, M.D.  ASSISTANT:  Wyatt Portela, PA-C.  ANESTHESIA:  Interscalene block, general.  ESTIMATED BLOOD LOSS:  Minimal.  DRAINS:  None.  COMPLICATIONS:  None.  DISPOSITION:  PACU, stable.  OPERATIVE DETAILS:  The patient was counseled in the holding area. Correct site was identified.  IV started, sedation given.  Interscalene block was administered per the anesthesiologist.  Taken to the OR, placed in supine position in general anesthesia, turned to a left lateral decubitus position, properly padded and bumped.  PAS stockings were applied for DVT prophylaxis.  Right upper extremity was then prepped with DuraPrep, draped in sterile fashion.  Exam had revealed stable posterior inferior with anterior laxity.  We utilized the overhead shoulder positioner at 30 degrees of abduction, 15 pounds longitudinal traction, and 5 pounds of lateral distraction.  Time-out again was done confirming the right side.  Posterior portal was created. An arthroscope was placed into the glenohumeral joint.  Diagnostic arthroscopy revealed intact articular cartilage other than the Hill- Sachs lesion.  Did not appear to be engaging.  The biceps labral anchor was intact.  Posterior inferior labrum was intact.   Anterior labrum was frayed and partially detached consistent with a Bankart lesion and there was marked capsular redundancy anteriorly.  The humeral insertion of the glenohumeral labrum was found to be intact.  The injury was placed at the capsulolabral area.  Had a markedly positive drive-through sign. Working portals were established, one over the rotator subscapularis, one to the rotator cuff interval.  The tissue was then prepared with a rasp by rasping the glenohumeral ligaments for bleeding response.  The spatula was utilized to detach the labrum for bony repair with anchors back to the glenoid.  This __________ the glenoid was then prepared with a burr down to bleeding bone for __________ side.  Beginning inferiorly and a nip and tuck fashion, the anterior and inferior glenohumeral ligament was grasped and a mattress suture of Arthrex labral tape, placed into a viable composite PushLock anchor, drilled and tacked into position.  Another mattress suture was then placed more superiorly, grabbed another area of the anterior IGH in the labrum and then one more anchor more superior to that, grabbing the last portion of the anterior IGH and the portion of the middle IGH ligament.  At this point in time, the humeral head was docked and __________ in the glenoid.  There was no instability noted.  It was nicely balanced and stable.  He also had a bumper effect anteriorly and the shoulder could not be subluxed and draw through sign was  eliminated.  __________ removed.  No abnormalities were noted in the subacromial region.  Again, the rotator cuff was intact arthroscopically.  Arthroscope was removed.  Taken out of traction.  The portals were closed with 4-0 nylon suture.  Another 10 mL of 0.25% Sensorcaine placed in skin edges.  Sterile dressing applied to the shoulder, turned supine, awakened and he was taken from the operating room to the PACU in stable condition.  He will be stabilized in  the PACU and discharged to home.  Preoperative antibiotics have been given.  To help with the patient's positioning, prepping and draping, technical and surgical assistance throughout the entire case, Mr. Wyatt Portela, PA-C's assistance was needed.          ______________________________ Cynda Familia, M.D.     RAC/MEDQ  D:  12/31/2013  T:  12/31/2013  Job:  [id number]

## 2014-09-01 ENCOUNTER — Other Ambulatory Visit: Payer: Self-pay | Admitting: Internal Medicine

## 2014-09-01 ENCOUNTER — Other Ambulatory Visit (INDEPENDENT_AMBULATORY_CARE_PROVIDER_SITE_OTHER): Payer: 59

## 2014-09-01 ENCOUNTER — Ambulatory Visit (INDEPENDENT_AMBULATORY_CARE_PROVIDER_SITE_OTHER): Payer: 59 | Admitting: Internal Medicine

## 2014-09-01 ENCOUNTER — Encounter: Payer: Self-pay | Admitting: Internal Medicine

## 2014-09-01 VITALS — BP 134/86 | HR 56 | Temp 98.0°F | Resp 14 | Ht 74.0 in | Wt 177.0 lb

## 2014-09-01 DIAGNOSIS — Z0000 Encounter for general adult medical examination without abnormal findings: Secondary | ICD-10-CM | POA: Diagnosis not present

## 2014-09-01 DIAGNOSIS — Z0189 Encounter for other specified special examinations: Secondary | ICD-10-CM

## 2014-09-01 LAB — BASIC METABOLIC PANEL
BUN: 18 mg/dL (ref 6–23)
CALCIUM: 9.6 mg/dL (ref 8.4–10.5)
CO2: 32 mEq/L (ref 19–32)
CREATININE: 1.23 mg/dL (ref 0.40–1.50)
Chloride: 102 mEq/L (ref 96–112)
GFR: 67.23 mL/min (ref >60.00)
Glucose, Bld: 87 mg/dL (ref 70–99)
Potassium: 4.1 mEq/L (ref 3.5–5.1)
Sodium: 138 mEq/L (ref 135–145)

## 2014-09-01 LAB — CBC WITH DIFFERENTIAL/PLATELET
BASOS ABS: 0 10*3/uL (ref 0.0–0.1)
Basophils Relative: 0.7 % (ref 0.0–3.0)
Eosinophils Absolute: 0.1 10*3/uL (ref 0.0–0.7)
Eosinophils Relative: 1.7 % (ref 0.0–5.0)
HEMATOCRIT: 43.6 % (ref 39.0–52.0)
Hemoglobin: 14.6 g/dL (ref 13.0–17.0)
LYMPHS ABS: 1.2 10*3/uL (ref 0.7–4.0)
Lymphocytes Relative: 21.1 % (ref 12.0–46.0)
MCHC: 33.4 g/dL (ref 30.0–36.0)
MCV: 85.9 fl (ref 78.0–100.0)
MONO ABS: 0.6 10*3/uL (ref 0.1–1.0)
Monocytes Relative: 11.3 % (ref 3.0–12.0)
NEUTROS ABS: 3.6 10*3/uL (ref 1.4–7.7)
Neutrophils Relative %: 65.2 % (ref 43.0–77.0)
PLATELETS: 216 10*3/uL (ref 150.0–400.0)
RBC: 5.08 Mil/uL (ref 4.22–5.81)
RDW: 14.1 % (ref 11.5–15.5)
WBC: 5.5 10*3/uL (ref 4.0–10.5)

## 2014-09-01 LAB — HEPATIC FUNCTION PANEL
ALT: 17 U/L (ref 0–53)
AST: 23 U/L (ref 0–37)
Albumin: 4.4 g/dL (ref 3.5–5.2)
Alkaline Phosphatase: 80 U/L (ref 39–117)
BILIRUBIN TOTAL: 0.8 mg/dL (ref 0.2–1.2)
Bilirubin, Direct: 0.1 mg/dL (ref 0.0–0.3)
Total Protein: 7.8 g/dL (ref 6.0–8.3)

## 2014-09-01 LAB — TSH: TSH: 0.77 u[IU]/mL (ref 0.35–4.50)

## 2014-09-01 NOTE — Progress Notes (Signed)
Subjective:    Patient ID: Jacob Buchanan, male    DOB: [birth year], 44 y.o.   MRN: [id number]  HPI He is here for a physical;acute issues denied.   He has been compliant with his PPI. He had endoscopy last year which revealed a stricture which was dilated.  He is on a heart healthy diet. He bikes twice a week & works out @ the gym 3 times a week without associated cardiopulmonary symptoms.  He's never smoked. He has 2 alcoholic beverages per day.  His sister has a history of asthma but this is totally quiescent. He uses no rescue agent.  His history of diabetes insipidus. There is no family history of diabetes mellitus, stroke, heart attack.  Review of Systems  Chest pain, palpitations, tachycardia, exertional dyspnea, paroxysmal nocturnal dyspnea, claudication or edema are absent. No unexplained weight loss, abdominal pain, significant dyspepsia, dysphagia, melena, rectal bleeding, or persistently small caliber stools. Dysuria, pyuria, hematuria, frequency, nocturia or polyuria are denied. Change in hair, skin, nails denied. No bowel changes of constipation or diarrhea. No intolerance to heat or cold.      Objective:   Physical Exam  Gen.: Adequately nourished in appearance. Alert, appropriate and cooperative throughout exam.  Appears younger than stated age  Head: Normocephalic without obvious abnormalities. Mustache and beard present; pattern alopecia  Eyes: No corneal or conjunctival inflammation noted. Pupils equal round reactive to light and accommodation. Extraocular motion intact.  Ears: External  ear exam reveals no significant lesions or deformities. Canals clear .TMs normal. Hearing is grossly normal bilaterally. Nose: External nasal exam reveals no deformity or inflammation. Nasal mucosa are pink and moist. No lesions or exudates noted.  Septum deviated to the right Mouth: Oral mucosa and oropharynx reveal no lesions or exudates. Teeth in good repair. Neck: No  deformities, masses, or tenderness noted. Range of motion slightly decreased. Thyroid normal. Lungs: Normal respiratory effort; chest expands symmetrically. Lungs are clear to auscultation without rales, wheezes, or increased work of breathing. Heart: Slow rate and regular rhythm. Normal S1 and S2. No gallop, click, or rub. No murmur. Abdomen: Bowel sounds normal; abdomen soft and nontender. No masses, organomegaly or hernias noted. Genitalia: Genitalia normal except for left varices. Prostate is normal without enlargement, asymmetry, nodularity, or induration. External hemorrhoidal tags                              Musculoskeletal/extremities: No deformity or scoliosis noted of  the thoracic or lumbar spine.  No clubbing, cyanosis, edema, or significant extremity  deformity noted.  Range of motion normal . Tone & strength normal. Hand joints normal.  Fingernail  health good. Crepitus of knees , left greater than right Able to lie down & sit up w/o help.  Negative SLR bilaterally Vascular: Carotid, radial artery, dorsalis pedis and  posterior tibial pulses are full and equal. No bruits present. Neurologic: Alert and oriented x3. Deep tendon reflexes symmetrical and normal.  Gait normal      Skin: Intact without suspicious lesions or rashes. Lymph: No cervical, axillary, or inguinal lymphadenopathy present. Psych: Mood and affect are normal. Normally interactive  Assessment & Plan:  #1 comprehensive physical exam; no acute findings  Plan: see Orders  & Recommendations

## 2014-09-01 NOTE — Patient Instructions (Addendum)
  Your next office appointment will be determined based upon review of your pending labs  and  xrays  Those written interpretation of the lab results and instructions will be transmitted to you by My Chart   Critical results will be called.   Followup as needed for any active or acute issue. Please report any significant change in your symptoms.  Reflux of gastric acid may be asymptomatic as this may occur mainly during sleep.The triggers for reflux  include stress; the "aspirin family" ; alcohol; peppermint; and caffeine (coffee, tea, cola, and chocolate). The aspirin family would include aspirin and the nonsteroidal agents such as ibuprofen &  Naproxen. Tylenol would not cause reflux. If having symptoms ; food & drink should be avoided for @ least 2 hours before going to bed.  

## 2014-09-01 NOTE — Progress Notes (Signed)
Pre visit review using our clinic review tool, if applicable. No additional management support is needed unless otherwise documented below in the visit note. 

## 2014-09-03 LAB — NMR LIPOPROFILE WITH LIPIDS
Cholesterol, Total: 221 mg/dL — ABNORMAL HIGH (ref 100–199)
HDL Particle Number: 33.9 umol/L (ref >=30.5)
HDL SIZE: 8.7 nm — AB (ref >=9.2)
HDL-C: 57 mg/dL (ref >39)
LARGE VLDL-P: 1.7 nmol/L (ref <=2.7)
LDL CALC: 146 mg/dL — AB (ref 0–99)
LDL Particle Number: 1755 nmol/L — ABNORMAL HIGH (ref <1000)
LDL SIZE: 20.6 nm (ref >=20.8)
LP-IR Score: 47 — ABNORMAL HIGH (ref <=45)
Large HDL-P: 3.8 umol/L — ABNORMAL LOW (ref >=4.8)
SMALL LDL PARTICLE NUMBER: 693 nmol/L — AB (ref <=527)
Triglycerides: 91 mg/dL (ref 0–149)
VLDL SIZE: 38.1 nm (ref <=46.6)

## 2014-09-30 ENCOUNTER — Telehealth: Payer: Self-pay | Admitting: Gastroenterology

## 2014-09-30 DIAGNOSIS — K222 Esophageal obstruction: Secondary | ICD-10-CM

## 2014-09-30 MED ORDER — OMEPRAZOLE 40 MG PO CPDR
40.0000 mg | DELAYED_RELEASE_CAPSULE | ORAL | Status: DC
Start: 2014-09-30 — End: 2014-10-20

## 2014-09-30 NOTE — Telephone Encounter (Signed)
Informed patient that I sent one refill until appt scheduled in August. Pt verbalized understanding.

## 2014-10-20 ENCOUNTER — Encounter: Payer: Self-pay | Admitting: Gastroenterology

## 2014-10-20 ENCOUNTER — Ambulatory Visit (INDEPENDENT_AMBULATORY_CARE_PROVIDER_SITE_OTHER): Payer: 59 | Admitting: Gastroenterology

## 2014-10-20 VITALS — BP 122/68 | HR 72 | Ht 74.0 in | Wt 182.3 lb

## 2014-10-20 DIAGNOSIS — K219 Gastro-esophageal reflux disease without esophagitis: Secondary | ICD-10-CM

## 2014-10-20 DIAGNOSIS — R6889 Other general symptoms and signs: Secondary | ICD-10-CM

## 2014-10-20 DIAGNOSIS — R198 Other specified symptoms and signs involving the digestive system and abdomen: Secondary | ICD-10-CM | POA: Diagnosis not present

## 2014-10-20 DIAGNOSIS — K222 Esophageal obstruction: Secondary | ICD-10-CM

## 2014-10-20 DIAGNOSIS — R0989 Other specified symptoms and signs involving the circulatory and respiratory systems: Secondary | ICD-10-CM

## 2014-10-20 MED ORDER — OMEPRAZOLE 40 MG PO CPDR
40.0000 mg | DELAYED_RELEASE_CAPSULE | Freq: Two times a day (BID) | ORAL | Status: DC
Start: 2014-10-20 — End: 2014-10-20

## 2014-10-20 MED ORDER — OMEPRAZOLE 40 MG PO CPDR: 40.0000 mg | DELAYED_RELEASE_CAPSULE | Freq: Two times a day (BID) | ORAL | Status: DC

## 2014-10-20 NOTE — Progress Notes (Signed)
    History of Present Illness: This is a 44 year old male with GERD. EGD in 06/2013 showed EGJ stricture which was dilated. He has been maintained on omeprazole 40 mg daily since his endoscopy. He relates no change in his chronic problems with throat clearing. The symptoms are very bothersome in the morning and occasionally bothersome during the day. He no longer has dysphagia.  Current Medications, Allergies, Past Medical History, Past Surgical History, Family History and Social History were reviewed in [employer] record.  Physical Exam: General: Well developed , well nourished, no acute distress Head: Normocephalic and atraumatic Eyes:  sclerae anicteric, EOMI Ears: Normal auditory acuity Mouth: No deformity or lesions Lungs: Clear throughout to auscultation Heart: Regular rate and rhythm; no murmurs, rubs or bruits Abdomen: Soft, non tender and non distended. No masses, hepatosplenomegaly or hernias noted. Normal Bowel sounds Musculoskeletal: Symmetrical with no gross deformities  Pulses:  Normal pulses noted Extremities: No clubbing, cyanosis, edema or deformities noted Neurological: Alert oriented x 4, grossly nonfocal Psychological:  Alert and cooperative. Normal mood and affect  Assessment and Recommendations:  1. Chronic throat clearing. Possible GERD with LPR although other etiologies will need to be evaluated if he does not respond to higher intensity reflux therapy. Change omeprazole to 40 mg twice daily before breakfast and dinner. Intensify all antireflux measures. If symptoms not under very good control within 2-3 months will likely need ENT and allergy referral.  2. GE junction stricture. No return of dysphasia since dilation in April 2015.  3. CRC screening, average risk. Colonoscopy at age 44.

## 2014-10-20 NOTE — Patient Instructions (Signed)
We have sent the following medications to your pharmacy for you to pick up at your convenience:omeprazole 40 mg to take one tablet by mouth twice daily.   Call back in 2-3 months with an update of your symptoms.   Thank you for choosing me and [facility].  Venita Lick. Pleas Koch., MD., Clementeen Graham

## 2014-11-22 ENCOUNTER — Other Ambulatory Visit: Payer: Self-pay | Admitting: Gastroenterology

## 2015-01-13 ENCOUNTER — Ambulatory Visit (INDEPENDENT_AMBULATORY_CARE_PROVIDER_SITE_OTHER): Payer: 59 | Admitting: Family

## 2015-01-13 ENCOUNTER — Encounter: Payer: Self-pay | Admitting: Family

## 2015-01-13 VITALS — BP 132/72 | HR 64 | Temp 98.4°F | Resp 18 | Ht 74.0 in | Wt 181.0 lb

## 2015-01-13 DIAGNOSIS — R519 Headache, unspecified: Secondary | ICD-10-CM | POA: Insufficient documentation

## 2015-01-13 DIAGNOSIS — R51 Headache: Secondary | ICD-10-CM

## 2015-01-13 NOTE — Assessment & Plan Note (Signed)
Headache of undetermined cause. Neurological and ENT exams are benign. Controlled with ibuprofen currently. Recommend conservative treatment with OTC medications as needed. Continue antihistamines and Netti pot to rule out sinus origin. Follow up if symptoms worsen or fail to improve for potential imaging and lab work.

## 2015-01-13 NOTE — Progress Notes (Signed)
Pre visit review using our clinic review tool, if applicable. No additional management support is needed unless otherwise documented below in the visit note. 

## 2015-01-13 NOTE — Patient Instructions (Signed)
Thank you for choosing [facility].  Summary/Instructions:  Continue over the counter medication as needed for symptom relief.   If your symptoms worsen or fail to improve, please contact our office for further instruction, or in case of emergency go directly to the emergency room at the closest medical facility.   .General Headache Without Cause A headache is pain or discomfort felt around the head or neck area. The specific cause of a headache may not be found. There are many causes and types of headaches. A few common ones are:  Tension headaches.  Migraine headaches.  Cluster headaches.  Chronic daily headaches. HOME CARE INSTRUCTIONS  Watch your condition for any changes. Take these steps to help with your condition: Managing Pain  Take over-the-counter and prescription medicines only as told by your health care provider.  Lie down in a dark, quiet room when you have a headache.  If directed, apply ice to the head and neck area:  Put ice in a plastic bag.  Place a towel between your skin and the bag.  Leave the ice on for 20 minutes, 2-3 times per day.  Use a heating pad or hot shower to apply heat to the head and neck area as told by your health care provider.  Keep lights dim if bright lights bother you or make your headaches worse. Eating and Drinking  Eat meals on a regular schedule.  Limit alcohol use.  Decrease the amount of caffeine you drink, or stop drinking caffeine. General Instructions  Keep all follow-up visits as told by your health care provider. This is important.  Keep a headache journal to help find out what may trigger your headaches. For example, write down:  What you eat and drink.  How much sleep you get.  Any change to your diet or medicines.  Try massage or other relaxation techniques.  Limit stress.  Sit up straight, and do not tense your muscles.  Do not use tobacco products, including cigarettes, chewing tobacco, or  e-cigarettes. If you need help quitting, ask your health care provider.  Exercise regularly as told by your health care provider.  Sleep on a regular schedule. Get 7-9 hours of sleep, or the amount recommended by your health care provider. SEEK MEDICAL CARE IF:   Your symptoms are not helped by medicine.  You have a headache that is different from the usual headache.  You have nausea or you vomit.  You have a fever. SEEK IMMEDIATE MEDICAL CARE IF:   Your headache becomes severe.  You have repeated vomiting.  You have a stiff neck.  You have a loss of vision.  You have problems with speech.  You have pain in the eye or ear.  You have muscular weakness or loss of muscle control.  You lose your balance or have trouble walking.  You feel faint or pass out.  You have confusion.   This information is not intended to replace advice given to you by your health care provider. Make sure you discuss any questions you have with your health care provider.   Document Released: 02/21/2005 Document Revised: 11/12/2014 Document Reviewed: 06/16/2014 Elsevier Interactive Patient Education [employer]2016 [employer]vier Inc.

## 2015-01-13 NOTE — Progress Notes (Signed)
   Subjective:    Patient ID: Jacob Buchanan, male    DOB: Dec 04, 1970, 44 y.o.   MRN: [id number]  Chief Complaint  Patient presents with  . Headache    has had a headache since friday that he describes as going in and out but never has went away, never gets headaches    HPI:  Jacob Buchanan is a 44 y.o. male who  has a past medical history of Sullivan LoneGilbert syndrome; Hyperlipidemia; GERD (gastroesophageal reflux disease); ED (erectile dysfunction); Partial tear of right rotator cuff; Incomplete right bundle branch block; and Esophageal stricture. and presents today for an acute office visit.   This is a new problem. Associated symptom of headache that has been going on for about 4 days. Started in the back of his head and progressed forward up to the top part of his head. Notes the pain sharply when he coughs. Described as pressure and similar to a sinus headache. Modifying factors include Advil when he went to the dentist and does not have a headache currently. Not currently experiencing a headache.   No Known Allergies   Current Outpatient Prescriptions on File Prior to Visit  Medication Sig Dispense Refill  . omeprazole (PRILOSEC) 40 MG capsule Take 1 capsule (40 mg total) by mouth 2 (two) times daily before a meal. 60 capsule 11   No current facility-administered medications on file prior to visit.    Review of Systems  Eyes:       Negative for changes in vision.  Musculoskeletal: Positive for neck stiffness. Negative for neck pain.  Neurological: Positive for headaches. Negative for weakness and numbness.      Objective:    BP 132/72 mmHg  Pulse 64  Temp(Src) 98.4 F (36.9 C) (Oral)  Resp 18  Ht 6\' 2"  (1.88 m)  Wt 181 lb (82.101 kg)  BMI 23.23 kg/m2  SpO2 98% Nursing note and vital signs reviewed.  Physical Exam  Constitutional: He is oriented to person, place, and time. He appears well-developed and well-nourished. No distress.  HENT:  Right Ear: Hearing,  tympanic membrane, external ear and ear canal normal.  Left Ear: Hearing, tympanic membrane, external ear and ear canal normal.  Nose: Nose normal.  Mouth/Throat: Uvula is midline, oropharynx is clear and moist and mucous membranes are normal.  Eyes: Conjunctivae and EOM are normal. Pupils are equal, round, and reactive to light.  Cardiovascular: Normal rate, regular rhythm, normal heart sounds and intact distal pulses.   Pulmonary/Chest: Effort normal and breath sounds normal.  Neurological: He is alert and oriented to person, place, and time. No cranial nerve deficit. Coordination normal.  Skin: Skin is warm and dry.  Psychiatric: He has a normal mood and affect. His behavior is normal. Judgment and thought content normal.       Assessment & Plan:   Problem List Items Addressed This Visit      Other   Generalized headache - Primary    Headache of undetermined cause. Neurological and ENT exams are benign. Controlled with ibuprofen currently. Recommend conservative treatment with OTC medications as needed. Continue antihistamines and Netti pot to rule out sinus origin. Follow up if symptoms worsen or fail to improve for potential imaging and lab work.

## 2015-05-05 ENCOUNTER — Encounter: Payer: Self-pay | Admitting: Family

## 2015-05-05 ENCOUNTER — Ambulatory Visit (INDEPENDENT_AMBULATORY_CARE_PROVIDER_SITE_OTHER): Payer: BLUE CROSS/BLUE SHIELD | Admitting: Family

## 2015-05-05 VITALS — BP 130/72 | HR 72 | Temp 97.8°F | Resp 16 | Ht 74.0 in | Wt 188.1 lb

## 2015-05-05 DIAGNOSIS — Z0000 Encounter for general adult medical examination without abnormal findings: Secondary | ICD-10-CM | POA: Diagnosis not present

## 2015-05-05 NOTE — Assessment & Plan Note (Signed)
1) Anticipatory Guidance: Discussed importance of wearing a seatbelt while driving and not texting while driving; changing batteries in smoke detector at least once annually; wearing suntan lotion when outside; eating a balanced and moderate diet; getting physical activity at least 30 minutes per day.  2) Immunizations / Screenings / Labs:  Declines flu shot. All other immunizations are up to date per recommendations. All screenings are up to date per recommendations. Obtain CBC, CMET, Lipid profile and TSH.   Overall well exam with risk factors for cardiovascular disease including hyperlipidemia. He is of good weight and exercises regularly. Continue current healthy lifestyle behaviors and choices. Follow up prevention exam in 1 year and follow up office visit for chronic conditions pending blood work.

## 2015-05-05 NOTE — Progress Notes (Signed)
Pre visit review using our clinic review tool, if applicable. No additional management support is needed unless otherwise documented below in the visit note. 

## 2015-05-05 NOTE — Patient Instructions (Signed)
Thank you for choosing  HealthCare.  Summary/Instructions:  Please stop by the lab on the basement level of the building for your blood work. Your results will be released to MyChart (or called to you) after review, usually within 72 hours after test completion. If any changes need to be made, you will be notified at that same time.  Health Maintenance, Male A healthy lifestyle and preventative care can promote health and wellness.  Maintain regular health, dental, and eye exams.  Eat a healthy diet. Foods like vegetables, fruits, whole grains, low-fat dairy products, and lean protein foods contain the nutrients you need and are low in calories. Decrease your intake of foods high in solid fats, added sugars, and salt. Get information about a proper diet from your health care provider, if necessary.  Regular physical exercise is one of the most important things you can do for your health. Most adults should get at least 150 minutes of moderate-intensity exercise (any activity that increases your heart rate and causes you to sweat) each week. In addition, most adults need muscle-strengthening exercises on 2 or more days a week.   Maintain a healthy weight. The body mass index (BMI) is a screening tool to identify possible weight problems. It provides an estimate of body fat based on height and weight. Your health care provider can find your BMI and can help you achieve or maintain a healthy weight. For males 20 years and older:  A BMI below 18.5 is considered underweight.  A BMI of 18.5 to 24.9 is normal.  A BMI of 25 to 29.9 is considered overweight.  A BMI of 30 and above is considered obese.  Maintain normal blood lipids and cholesterol by exercising and minimizing your intake of saturated fat. Eat a balanced diet with plenty of fruits and vegetables. Blood tests for lipids and cholesterol should begin at age 20 and be repeated every 5 years. If your lipid or cholesterol levels are  high, you are over age 50, or you are at high risk for heart disease, you may need your cholesterol levels checked more frequently.Ongoing high lipid and cholesterol levels should be treated with medicines if diet and exercise are not working.  If you smoke, find out from your health care provider how to quit. If you do not use tobacco, do not start.  Lung cancer screening is recommended for adults aged 55-80 years who are at high risk for developing lung cancer because of a history of smoking. A yearly low-dose CT scan of the lungs is recommended for people who have at least a 30-pack-year history of smoking and are current smokers or have quit within the past 15 years. A pack year of smoking is smoking an average of 1 pack of cigarettes a day for 1 year (for example, a 30-pack-year history of smoking could mean smoking 1 pack a day for 30 years or 2 packs a day for 15 years). Yearly screening should continue until the smoker has stopped smoking for at least 15 years. Yearly screening should be stopped for people who develop a health problem that would prevent them from having lung cancer treatment.  If you choose to drink alcohol, do not have more than 2 drinks per day. One drink is considered to be 12 oz (360 mL) of beer, 5 oz (150 mL) of wine, or 1.5 oz (45 mL) of liquor.  Avoid the use of street drugs. Do not share needles with anyone. Ask for help if you need   support or instructions about stopping the use of drugs.  High blood pressure causes heart disease and increases the risk of stroke. High blood pressure is more likely to develop in:  People who have blood pressure in the end of the normal range (100-139/85-89 mm Hg).  People who are overweight or obese.  People who are African American.  If you are 18-39 years of age, have your blood pressure checked every 3-5 years. If you are 40 years of age or older, have your blood pressure checked every year. You should have your blood pressure  measured twice--once when you are at a hospital or clinic, and once when you are not at a hospital or clinic. Record the average of the two measurements. To check your blood pressure when you are not at a hospital or clinic, you can use:  An automated blood pressure machine at a pharmacy.  A home blood pressure monitor.  If you are 45-79 years old, ask your health care provider if you should take aspirin to prevent heart disease.  Diabetes screening involves taking a blood sample to check your fasting blood sugar level. This should be done once every 3 years after age 45 if you are at a normal weight and without risk factors for diabetes. Testing should be considered at a younger age or be carried out more frequently if you are overweight and have at least 1 risk factor for diabetes.  Colorectal cancer can be detected and often prevented. Most routine colorectal cancer screening begins at the age of 50 and continues through age 75. However, your health care provider may recommend screening at an earlier age if you have risk factors for colon cancer. On a yearly basis, your health care provider may provide home test kits to check for hidden blood in the stool. A small camera at the end of a tube may be used to directly examine the colon (sigmoidoscopy or colonoscopy) to detect the earliest forms of colorectal cancer. Talk to your health care provider about this at age 50 when routine screening begins. A direct exam of the colon should be repeated every 5-10 years through age 75, unless early forms of precancerous polyps or small growths are found.  People who are at an increased risk for hepatitis B should be screened for this virus. You are considered at high risk for hepatitis B if:  You were born in a country where hepatitis B occurs often. Talk with your health care provider about which countries are considered high risk.  Your parents were born in a high-risk country and you have not received a  shot to protect against hepatitis B (hepatitis B vaccine).  You have HIV or AIDS.  You use needles to inject street drugs.  You live with, or have sex with, someone who has hepatitis B.  You are a man who has sex with other men (MSM).  You get hemodialysis treatment.  You take certain medicines for conditions like cancer, organ transplantation, and autoimmune conditions.  Hepatitis C blood testing is recommended for all people born from 1945 through 1965 and any individual with known risk factors for hepatitis C.  Healthy men should no longer receive prostate-specific antigen (PSA) blood tests as part of routine cancer screening. Talk to your health care provider about prostate cancer screening.  Testicular cancer screening is not recommended for adolescents or adult males who have no symptoms. Screening includes self-exam, a health care provider exam, and other screening tests. Consult with your   health care provider about any symptoms you have or any concerns you have about testicular cancer.  Practice safe sex. Use condoms and avoid high-risk sexual practices to reduce the spread of sexually transmitted infections (STIs).  You should be screened for STIs, including gonorrhea and chlamydia if:  You are sexually active and are younger than 24 years.  You are older than 24 years, and your health care provider tells you that you are at risk for this type of infection.  Your sexual activity has changed since you were last screened, and you are at an increased risk for chlamydia or gonorrhea. Ask your health care provider if you are at risk.  If you are at risk of being infected with HIV, it is recommended that you take a prescription medicine daily to prevent HIV infection. This is called pre-exposure prophylaxis (PrEP). You are considered at risk if:  You are a man who has sex with other men (MSM).  You are a heterosexual man who is sexually active with multiple partners.  You take  drugs by injection.  You are sexually active with a partner who has HIV.  Talk with your health care provider about whether you are at high risk of being infected with HIV. If you choose to begin PrEP, you should first be tested for HIV. You should then be tested every 3 months for as long as you are taking PrEP.  Use sunscreen. Apply sunscreen liberally and repeatedly throughout the day. You should seek shade when your shadow is shorter than you. Protect yourself by wearing long sleeves, pants, a wide-brimmed hat, and sunglasses year round whenever you are outdoors.  Tell your health care provider of new moles or changes in moles, especially if there is a change in shape or color. Also, tell your health care provider if a mole is larger than the size of a pencil eraser.  A one-time screening for abdominal aortic aneurysm (AAA) and surgical repair of large AAAs by ultrasound is recommended for men aged 65-75 years who are current or former smokers.  Stay current with your vaccines (immunizations).   This information is not intended to replace advice given to you by your health care provider. Make sure you discuss any questions you have with your health care provider.   Document Released: 08/20/2007 Document Revised: 03/14/2014 Document Reviewed: 07/19/2010 Elsevier Interactive Patient Education 2016 [employer] Inc[employer]  

## 2015-05-05 NOTE — Progress Notes (Signed)
Subjective:    Patient ID: Jacob Buchanan, male    DOB: [birth year], 45 y.o.   MRN: [id number]  Chief Complaint  Patient presents with  . CPE    Not fasting    HPI:  ALSON Buchanan is a 45 y.o. male who presents today for an annual wellness visit.   1) Health Maintenance -   Diet - 4 small meals daily with meats, fruits, vegtables. Reports caffeine approximatley 2 per day  Exercise - Runs, gym, mountain bike 5 times per week.  2) Preventative Exams / Immunizations:  Dental --Up to date  Vision -- Up to date   Health Maintenance  Topic Date Due  . INFLUENZA VACCINE  06/05/2015 (Originally 10/06/2014)  . TETANUS/TDAP  09/13/2017  . HIV Screening  Completed    Immunization History  Administered Date(s) Administered  . Td 09/14/2007    No Known Allergies   Outpatient Prescriptions Prior to Visit  Medication Sig Dispense Refill  . omeprazole (PRILOSEC) 40 MG capsule Take 1 capsule (40 mg total) by mouth 2 (two) times daily before a meal. 60 capsule 11   No facility-administered medications prior to visit.     Past Medical History  Diagnosis Date  . Gilbert syndrome   . Hyperlipidemia   . GERD (gastroesophageal reflux disease)   . ED (erectile dysfunction)   . Partial tear of right rotator cuff   . Incomplete right bundle branch block   . Esophageal stricture      Past Surgical History  Procedure Laterality Date  . Finger surgery  1994    repair/ reconstruction right index crush injury  . Inguinal hernia repair Bilateral age 69  . Colonoscopy  2004 ; 2007; & 05-29-2012    last one with propofol and hemorroidectomy with sclerosing  . Shoulder arthroscopy with labral repair Right 12/31/2013    Procedure: RIGHT SHOULDER ARTHROSCOPY WITH LABRAL REPAIR ;  Surgeon: Eugenia Mcalpine, MD;  Location: [facility];  Service: Orthopedics;  Laterality: Right;  . Upper gi endoscopy  2015    esophageal dilation     Family History  Problem  Relation Age of Onset  . Lung cancer Maternal Grandfather   . Other Sister     Diabetes Insipidus  . Mitral valve prolapse Father   . Ulcers Sister     peptic  . Emphysema Paternal Grandfather   . Lung cancer Paternal Grandfather   . Stroke Neg Hx   . Heart disease Neg Hx      Social History   Social History  . Marital Status: Divorced    Spouse Name: N/A  . Number of Children: 0  . Years of Education: N/A   Occupational History  . landscaper-self  employed    Social History Main Topics  . Smoking status: Never Smoker   . Smokeless tobacco: Never Used  . Alcohol Use: 7.0 oz/week    14 drink(s) per week     Comment: 2 drinks per day  . Drug Use: No  . Sexual Activity: Not on file   Other Topics Concern  . Not on file   Social History Narrative      Review of Systems  Constitutional: Denies fever, chills, fatigue, or significant weight gain/loss. HENT: Head: Denies headache or neck pain Ears: Denies changes in hearing, ringing in ears, earache, drainage Nose: Denies discharge, stuffiness, itching, nosebleed, sinus pain Throat: Denies sore throat, hoarseness, dry mouth, sores, thrush Eyes: Denies loss/changes in vision, pain, redness,  blurry/double vision, flashing lights Cardiovascular: Denies chest pain/discomfort, tightness, palpitations, shortness of breath with activity, difficulty lying down, swelling, sudden awakening with shortness of breath Respiratory: Denies shortness of breath, cough, sputum production, wheezing Gastrointestinal: Denies dysphasia, heartburn, change in appetite, nausea, change in bowel habits, rectal bleeding, constipation, diarrhea, yellow skin or eyes Genitourinary: Denies frequency, urgency, burning/pain, blood in urine, incontinence, change in urinary strength. Musculoskeletal: Denies muscle/joint pain, stiffness, back pain, redness or swelling of joints, trauma Skin: Denies rashes, lumps, itching, dryness, color changes, or  hair/nail changes Neurological: Denies dizziness, fainting, seizures, weakness, numbness, tingling, tremor Psychiatric - Denies nervousness, stress, depression or memory loss Endocrine: Denies heat or cold intolerance, sweating, frequent urination, excessive thirst, changes in appetite Hematologic: Denies ease of bruising or bleeding     Objective:     BP 130/72 mmHg  Pulse 72  Temp(Src) 97.8 F (36.6 C) (Oral)  Resp 16  Ht  (1.88 m)  Wt 188 lb 1.9 oz (85.331 kg)  BMI 24.14 kg/m2  SpO2 96% Nursing note and vital signs reviewed.  Physical Exam  Constitutional: He is oriented to person, place, and time. He appears well-developed and well-nourished.  HENT:  Head: Normocephalic.  Right Ear: Hearing, tympanic membrane, external ear and ear canal normal.  Left Ear: Hearing, tympanic membrane, external ear and ear canal normal.  Nose: Nose normal.  Mouth/Throat: Uvula is midline, oropharynx is clear and moist and mucous membranes are normal.  Eyes: Conjunctivae and EOM are normal. Pupils are equal, round, and reactive to light.  Neck: Neck supple. No JVD present. No tracheal deviation present. No thyromegaly present.  Cardiovascular: Normal rate, regular rhythm, normal heart sounds and intact distal pulses.   Pulmonary/Chest: Effort normal and breath sounds normal.  Abdominal: Soft. Bowel sounds are normal. He exhibits no distension and no mass. There is no tenderness. There is no rebound and no guarding.  Musculoskeletal: Normal range of motion. He exhibits no edema or tenderness.  Lymphadenopathy:    He has no cervical adenopathy.  Neurological: He is alert and oriented to person, place, and time. He has normal reflexes. No cranial nerve deficit. He exhibits normal muscle tone. Coordination normal.  Skin: Skin is warm and dry.  Psychiatric: He has a normal mood and affect. His behavior is normal. Judgment and thought content normal.       Assessment & Plan:   Problem List  Items Addressed This Visit      Other   Routine general medical examination at a health care facility - Primary    1) Anticipatory Guidance: Discussed importance of wearing a seatbelt while driving and not texting while driving; changing batteries in smoke detector at least once annually; wearing suntan lotion when outside; eating a balanced and moderate diet; getting physical activity at least 30 minutes per day.  2) Immunizations / Screenings / Labs:  Declines flu shot. All other immunizations are up to date per recommendations. All screenings are up to date per recommendations. Obtain CBC, CMET, Lipid profile and TSH.   Overall well exam with risk factors for cardiovascular disease including hyperlipidemia. He is of good weight and exercises regularly. Continue current healthy lifestyle behaviors and choices. Follow up prevention exam in 1 year and follow up office visit for chronic conditions pending blood work.       Relevant Orders   CBC   Comprehensive metabolic panel   Lipid panel   TSH

## 2015-07-21 DIAGNOSIS — H5213 Myopia, bilateral: Secondary | ICD-10-CM | POA: Diagnosis not present

## 2015-11-10 ENCOUNTER — Other Ambulatory Visit: Payer: Self-pay | Admitting: Gastroenterology

## 2015-12-16 ENCOUNTER — Telehealth: Payer: Self-pay | Admitting: Gastroenterology

## 2015-12-16 MED ORDER — OMEPRAZOLE 40 MG PO CPDR: DELAYED_RELEASE_CAPSULE | ORAL | 0 refills | Status: DC

## 2015-12-16 NOTE — Telephone Encounter (Signed)
Omeprazole sent to patient's pharmacy until scheduled appt.

## 2016-01-20 ENCOUNTER — Telehealth: Payer: Self-pay | Admitting: Gastroenterology

## 2016-01-21 MED ORDER — OMEPRAZOLE 40 MG PO CPDR: DELAYED_RELEASE_CAPSULE | ORAL | 0 refills | Status: DC

## 2016-01-21 NOTE — Telephone Encounter (Signed)
Prescription sent to patient's pharmacy and patient notified to keep appt for any further refills. 

## 2016-01-27 DIAGNOSIS — H5213 Myopia, bilateral: Secondary | ICD-10-CM | POA: Diagnosis not present

## 2016-02-03 ENCOUNTER — Encounter: Payer: Self-pay | Admitting: Gastroenterology

## 2016-02-03 ENCOUNTER — Ambulatory Visit (INDEPENDENT_AMBULATORY_CARE_PROVIDER_SITE_OTHER): Payer: BLUE CROSS/BLUE SHIELD | Admitting: Gastroenterology

## 2016-02-03 VITALS — BP 126/68 | HR 64 | Ht 72.44 in | Wt 190.0 lb

## 2016-02-03 DIAGNOSIS — K219 Gastro-esophageal reflux disease without esophagitis: Secondary | ICD-10-CM | POA: Diagnosis not present

## 2016-02-03 MED ORDER — OMEPRAZOLE 40 MG PO CPDR: DELAYED_RELEASE_CAPSULE | ORAL | 11 refills | Status: DC

## 2016-02-03 NOTE — Patient Instructions (Addendum)
We have sent the following medications to your pharmacy for you to pick up at your convenience: omeprazole.  You will be due for a recall colonoscopy in 04/2018. We will send you a reminder in the mail when it gets closer to that time.   Thank you for choosing me and [facility].  Venita LickMalcolm T. Pleas KochStark, Jr., MD., Clementeen GrahamFACG

## 2016-02-03 NOTE — Progress Notes (Signed)
       History of Present Illness: This is a 45 year old male with GERD. Has occasional difficulty swallowing pills. No difficulties with solid foods or liquids. Reflux symptoms are generally very well controlled however he has had couple episodes of heartburn and regurgitation.  Current Medications, Allergies, Past Medical History, Past Surgical History, Family History and Social History were reviewed in [facility] electronic medical record.  Physical Exam: General: Well developed, well nourished, no acute distress Head: Normocephalic and atraumatic Eyes:  sclerae anicteric, EOMI Ears: Normal auditory acuity Mouth: No deformity or lesions Lungs: Clear throughout to auscultation Heart: Regular rate and rhythm; no murmurs, rubs or bruits Abdomen: Soft, non tender and non distended. No masses, hepatosplenomegaly or hernias noted. Normal Bowel sounds Musculoskeletal: Symmetrical with no gross deformities  Pulses:  Normal pulses noted Extremities: No clubbing, cyanosis, edema or deformities noted Neurological: Alert oriented x 4, grossly nonfocal Psychological:  Alert and cooperative. Normal mood and affect  Assessment and Recommendations:  1. GERD. Prior esophageal stricture. Difficulty swallowing large pills is likely oropharyngeal and not related to a stricture however if this problem worsens or if solid food dysphagia develops we will plan to repeat his EGD. Continue omeprazole 40 mg twice daily and standard antireflux measures.

## 2016-02-08 DIAGNOSIS — Z3009 Encounter for other general counseling and advice on contraception: Secondary | ICD-10-CM | POA: Diagnosis not present

## 2016-02-08 DIAGNOSIS — N5201 Erectile dysfunction due to arterial insufficiency: Secondary | ICD-10-CM | POA: Diagnosis not present

## 2016-02-08 DIAGNOSIS — R300 Dysuria: Secondary | ICD-10-CM | POA: Diagnosis not present

## 2016-02-08 DIAGNOSIS — R6882 Decreased libido: Secondary | ICD-10-CM | POA: Diagnosis not present

## 2016-03-29 DIAGNOSIS — Z302 Encounter for sterilization: Secondary | ICD-10-CM | POA: Diagnosis not present

## 2016-09-14 DIAGNOSIS — Z1283 Encounter for screening for malignant neoplasm of skin: Secondary | ICD-10-CM | POA: Diagnosis not present

## 2016-09-14 DIAGNOSIS — L821 Other seborrheic keratosis: Secondary | ICD-10-CM | POA: Diagnosis not present

## 2016-11-14 DIAGNOSIS — H5213 Myopia, bilateral: Secondary | ICD-10-CM | POA: Diagnosis not present

## 2017-03-24 ENCOUNTER — Ambulatory Visit (HOSPITAL_COMMUNITY)
Admission: EM | Admit: 2017-03-24 | Discharge: 2017-03-24 | Disposition: A | Discharge status: Home / Self Care | Payer: BLUE CROSS/BLUE SHIELD | Attending: Emergency Medicine | Admitting: Emergency Medicine

## 2017-03-24 ENCOUNTER — Encounter (HOSPITAL_COMMUNITY): Payer: Self-pay | Admitting: Family Medicine

## 2017-03-24 DIAGNOSIS — J0100 Acute maxillary sinusitis, unspecified: Secondary | ICD-10-CM | POA: Diagnosis not present

## 2017-03-24 MED ORDER — HYDROCOD POLST-CPM POLST ER 10-8 MG/5ML PO SUER: 5.0000 mL | Freq: Two times a day (BID) | ORAL | 0 refills | Status: DC | PRN

## 2017-03-24 MED ORDER — ALBUTEROL SULFATE HFA 108 (90 BASE) MCG/ACT IN AERS: 1.0000 | INHALATION_SPRAY | Freq: Four times a day (QID) | RESPIRATORY_TRACT | 0 refills | Status: DC | PRN

## 2017-03-24 MED ORDER — AMOXICILLIN-POT CLAVULANATE 875-125 MG PO TABS: 1.0000 | ORAL_TABLET | Freq: Two times a day (BID) | ORAL | 0 refills | Status: DC

## 2017-03-24 MED ORDER — BENZONATATE 200 MG PO CAPS: 200.0000 mg | ORAL_CAPSULE | Freq: Three times a day (TID) | ORAL | 0 refills | Status: DC | PRN

## 2017-03-24 MED ORDER — AEROCHAMBER PLUS MISC: 2 refills | Status: DC

## 2017-03-24 NOTE — ED Provider Notes (Signed)
HPI  SUBJECTIVE:  Jacob Buchanan is a 47 y.o. male who presents with URI symptoms for 17 days.  He states that the nasal congestion has turned thick, yellow, green.  He reports rhinorrhea, postnasal drip, sinus pain and pressure and a cough productive of the same material as his nasal congestion.  Denies upper dental pain.  He does report some wheezing.  No ear pain, chest pain, shortness of breath.  No fevers.  He has been taking NyQuil, Mucinex, Flonase and doing saline nasal irrigation.  Saline nasal irrigation seems to be helping the most.  Symptoms are worse with lying down.  No antibiotics in the past month.  No antipyretics in the past 6-8 hours.  He has a past medical history of seasonal allergies.  No history of asthma, eczema, COPD, smoking, diabetes, hypertension.  ZOX:WRUE   Past Medical History:  Diagnosis Date  . ED (erectile dysfunction)   . Esophageal stricture   . GERD (gastroesophageal reflux disease)   . Gilbert syndrome   . Hyperlipidemia   . Incomplete right bundle branch block   . Partial tear of right rotator cuff     Past Surgical History:  Procedure Laterality Date  . COLONOSCOPY  2004 ; 2007; & 05-29-2012   last one with propofol and hemorroidectomy with sclerosing  . FINGER SURGERY  1994   repair/ reconstruction right index crush injury  . INGUINAL HERNIA REPAIR Bilateral age 6  . SHOULDER ARTHROSCOPY WITH LABRAL REPAIR Right 12/31/2013   Procedure: RIGHT SHOULDER ARTHROSCOPY WITH LABRAL REPAIR ;  Surgeon: Eugenia Mcalpine, MD;  Location: [facility];  Service: Orthopedics;  Laterality: Right;  . UPPER GI ENDOSCOPY  2015   esophageal dilation    Family History  Problem Relation Age of Onset  . Lung cancer Maternal Grandfather   . Other Sister        Diabetes Insipidus  . Mitral valve prolapse Father   . Ulcers Sister        peptic  . Emphysema Paternal Grandfather   . Lung cancer Paternal Grandfather   . Stroke Neg Hx   . Heart  disease Neg Hx     Social History   Tobacco Use  . Smoking status: Never Smoker  . Smokeless tobacco: Never Used  Substance Use Topics  . Alcohol use: Yes    Alcohol/week: 7.0 oz    Types: 14 drink(s) per week    Comment: 2 drinks per day  . Drug use: No    No current facility-administered medications for this encounter.   Current Outpatient Medications:  .  albuterol (PROVENTIL HFA;VENTOLIN HFA) 108 (90 Base) MCG/ACT inhaler, Inhale 1-2 puffs into the lungs every 6 (six) hours as needed for wheezing or shortness of breath., Disp: 1 Inhaler, Rfl: 0 .  amoxicillin-clavulanate (AUGMENTIN) 875-125 MG tablet, Take 1 tablet by mouth 2 (two) times daily. X 7 days, Disp: 14 tablet, Rfl: 0 .  benzonatate (TESSALON) 200 MG capsule, Take 1 capsule (200 mg total) by mouth 3 (three) times daily as needed for cough., Disp: 30 capsule, Rfl: 0 .  chlorpheniramine-HYDROcodone (TUSSIONEX PENNKINETIC ER) 10-8 MG/5ML SUER, Take 5 mLs by mouth every 12 (twelve) hours as needed for cough., Disp: 120 mL, Rfl: 0 .  omeprazole (PRILOSEC) 40 MG capsule, TAKE 1 CAPSULE (40 MG TOTAL) BY MOUTH 2 (TWO) TIMES DAILY., Disp: 60 capsule, Rfl: 11 .  Spacer/Aero-Holding Chambers (AEROCHAMBER PLUS) inhaler, Use as instructed, Disp: 1 each, Rfl: 2  No Known Allergies  ROS  As noted in HPI.   Physical Exam  BP 134/72   Pulse 64   Temp 98.4 F (36.9 C)   Resp 18   SpO2 100%   Constitutional: Well developed, well nourished, no acute distress Eyes:  EOMI, conjunctiva normal bilaterally HENT: Normocephalic, atraumatic,mucus membranes moist.  Purulent nasal congestion.  Erythematous, swollen turbinates.  No frontal sinus tenderness.  Mild maxillary sinus tenderness.  Oropharynx normal.  Positive postnasal drip. Respiratory: Normal inspiratory effort, good air movement, lungs clear bilaterally.  Positive dry cough Cardiovascular: Normal rate GI: nondistended skin: No rash, skin intact Musculoskeletal: no  deformities Neurologic: Alert & oriented x 3, no focal neuro deficits Psychiatric: Speech and behavior appropriate   ED Course   Medications - No data to display  No orders of the defined types were placed in this encounter.   No results found for this or any previous visit (from the past 24 hour(s)). No results found.  ED Clinical Impression  Acute non-recurrent maxillary sinusitis   ED Assessment/Plan  Patient with a sinusitis.  Given duration of patient's symptoms, feel that antibiotics are indicated.  Will send home with Augmentin for sinus infection.  Also, continue saline nasal irrigation, Neti pot.  Tessalon for the cough during the day, Tussionex for the cough at night, albuterol inhaler with a spacer as needed for wheezing.  Although suspect this is probably mucus plugging.  Providing patient primary care referral list and will order primary care referral as he has no PMD for routine care. [place] Narcotic database reviewed for this patient, and feel that the risk/benefit ratio today is favorable for proceeding with a prescription for controlled substance.  Discussed  MDM, plan and followup with patient.  patient agrees with plan.   Meds ordered this encounter  Medications  . amoxicillin-clavulanate (AUGMENTIN) 875-125 MG tablet    Sig: Take 1 tablet by mouth 2 (two) times daily. X 7 days    Dispense:  14 tablet    Refill:  0  . Spacer/Aero-Holding Chambers (AEROCHAMBER PLUS) inhaler    Sig: Use as instructed    Dispense:  1 each    Refill:  2  . benzonatate (TESSALON) 200 MG capsule    Sig: Take 1 capsule (200 mg total) by mouth 3 (three) times daily as needed for cough.    Dispense:  30 capsule    Refill:  0  . chlorpheniramine-HYDROcodone (TUSSIONEX PENNKINETIC ER) 10-8 MG/5ML SUER    Sig: Take 5 mLs by mouth every 12 (twelve) hours as needed for cough.    Dispense:  120 mL    Refill:  0  . albuterol (PROVENTIL HFA;VENTOLIN HFA) 108 (90 Base) MCG/ACT inhaler     Sig: Inhale 1-2 puffs into the lungs every 6 (six) hours as needed for wheezing or shortness of breath.    Dispense:  1 Inhaler    Refill:  0    *This clinic note was created using Scientist, clinical (histocompatibility and immunogenetics)Dragon dictation software. Therefore, there may be occasional mistakes despite careful proofreading.   ?   Domenick GongMortenson, Chessica Audia, MD 03/25/17 [id number]

## 2017-03-24 NOTE — ED Triage Notes (Signed)
Pt here for URI symptoms.  

## 2017-03-24 NOTE — Discharge Instructions (Signed)
Below is a list of primary care practices who are taking new patients for you to follow-up with. °[facility] °[address] °[place], [place] [address] °[phone] ° °[facility] °[phone] °[address] °[place] [address] ° °[facility]: [address] °[place] [address]  °[phone] ° °[facility]: [address] °[place] [address]   °[phone] ° °[facility]: [address] °[place] [address]  °[phone] ° °[facility] : [address] [place] [address] °[phone] ° °[facility]: [address] °[place] [address] °[phone] ° °[facility]: [address] °[place] [address] °[phone] ° °Dr. Mahima Pandey [address] [facility] [place] [address]  °[phone] ° °Dr. George Osei-Bonsu, [facility]. [address] [place], [place] [address]  °[phone] ° °Go to www.goodrx.com to look up your medications. This will give you a list of where you can find your prescriptions at the most affordable prices. Or ask the pharmacist what the cash price is, or if they have any other discount programs available to help make your medication more affordable. This can be less expensive than what you would pay with insurance.   °

## 2017-04-10 ENCOUNTER — Other Ambulatory Visit: Payer: Self-pay | Admitting: Gastroenterology

## 2017-04-13 ENCOUNTER — Telehealth: Payer: Self-pay | Admitting: Gastroenterology

## 2017-04-13 MED ORDER — OMEPRAZOLE 40 MG PO CPDR: DELAYED_RELEASE_CAPSULE | ORAL | 1 refills | Status: DC

## 2017-04-13 NOTE — Telephone Encounter (Signed)
Prescription sent to patient's pharmacy and patient notified to keep appt for further refills. 

## 2017-04-19 ENCOUNTER — Ambulatory Visit (INDEPENDENT_AMBULATORY_CARE_PROVIDER_SITE_OTHER): Payer: BLUE CROSS/BLUE SHIELD | Admitting: Family Medicine

## 2017-04-19 ENCOUNTER — Encounter: Payer: Self-pay | Admitting: Family Medicine

## 2017-04-19 VITALS — BP 128/72 | HR 61 | Temp 98.4°F | Ht 72.0 in | Wt 190.8 lb

## 2017-04-19 DIAGNOSIS — J342 Deviated nasal septum: Secondary | ICD-10-CM

## 2017-04-19 DIAGNOSIS — Z125 Encounter for screening for malignant neoplasm of prostate: Secondary | ICD-10-CM

## 2017-04-19 DIAGNOSIS — Z0001 Encounter for general adult medical examination with abnormal findings: Secondary | ICD-10-CM | POA: Diagnosis not present

## 2017-04-19 DIAGNOSIS — E785 Hyperlipidemia, unspecified: Secondary | ICD-10-CM

## 2017-04-19 DIAGNOSIS — K219 Gastro-esophageal reflux disease without esophagitis: Secondary | ICD-10-CM | POA: Diagnosis not present

## 2017-04-19 LAB — COMPREHENSIVE METABOLIC PANEL
ALBUMIN: 4.1 g/dL (ref 3.5–5.2)
ALK PHOS: 70 U/L (ref 39–117)
ALT: 22 U/L (ref 0–53)
AST: 21 U/L (ref 0–37)
BUN: 19 mg/dL (ref 6–23)
CALCIUM: 8.9 mg/dL (ref 8.4–10.5)
CHLORIDE: 105 meq/L (ref 96–112)
CO2: 30 mEq/L (ref 19–32)
Creatinine, Ser: 1.09 mg/dL (ref 0.40–1.50)
GFR: 76.42 mL/min (ref >60.00)
Glucose, Bld: 96 mg/dL (ref 70–99)
POTASSIUM: 4.6 meq/L (ref 3.5–5.1)
Sodium: 140 mEq/L (ref 135–145)
TOTAL PROTEIN: 6.9 g/dL (ref 6.0–8.3)
Total Bilirubin: 0.6 mg/dL (ref 0.2–1.2)

## 2017-04-19 LAB — PSA: PSA: 2.15 ng/mL (ref 0.10–4.00)

## 2017-04-19 LAB — LIPID PANEL
CHOLESTEROL: 178 mg/dL (ref 0–200)
HDL: 38.4 mg/dL — AB (ref >39.00)
LDL Cholesterol: 116 mg/dL — ABNORMAL HIGH (ref 0–99)
NONHDL: 139.81
Total CHOL/HDL Ratio: 5
Triglycerides: 121 mg/dL (ref 0.0–149.0)
VLDL: 24.2 mg/dL (ref 0.0–40.0)

## 2017-04-19 LAB — CBC
HEMATOCRIT: 31.8 % — AB (ref 39.0–52.0)
HEMOGLOBIN: 9.9 g/dL — AB (ref 13.0–17.0)
MCHC: 31.2 g/dL (ref 30.0–36.0)
MCV: 72.1 fl — AB (ref 78.0–100.0)
PLATELETS: 252 10*3/uL (ref 150.0–400.0)
RBC: 4.41 Mil/uL (ref 4.22–5.81)
RDW: 17.9 % — ABNORMAL HIGH (ref 11.5–15.5)
WBC: 4.5 10*3/uL (ref 4.0–10.5)

## 2017-04-19 NOTE — Progress Notes (Signed)
Subjective:  Jacob Buchanan is a 47 y.o. male who presents today for his annual comprehensive physical exam.    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  Works in office most of the day.  Office work is not particularly stressful.  He has no acute complaints today.   Lifestyle Diet: No specific diet.  Exercise: Rides bikes. Goes to gym a few times per week.   Depression screen [facility] 2/9 04/19/2017  Decreased Interest 0  Down, Depressed, Hopeless 0  PHQ - 2 Score 0   ROS: Per HPI, otherwise a 10 point review of systems was performed and was negative  PMH:  The following were reviewed and entered/updated in epic: Past Medical History:  Diagnosis Date  . ED (erectile dysfunction)   . Esophageal stricture   . GERD (gastroesophageal reflux disease)   . Gilbert syndrome   . Hyperlipidemia   . Incomplete right bundle branch block   . Partial tear of right rotator cuff    Patient Active Problem List   Diagnosis Date Noted  . GERD (gastroesophageal reflux disease) 04/19/2017  . Generalized headache 01/13/2015  . Dyslipidemia 09/14/2007  . RHINOCONJUNCTIVITIS, ALLERGIC 09/14/2007  . ASTHMA 09/14/2007   Past Surgical History:  Procedure Laterality Date  . COLONOSCOPY  2004 ; 2007; & 05-29-2012   last one with propofol and hemorroidectomy with sclerosing  . FINGER SURGERY  1994   repair/ reconstruction right index crush injury  . INGUINAL HERNIA REPAIR Bilateral age 58  . SHOULDER ARTHROSCOPY WITH LABRAL REPAIR Right 12/31/2013   Procedure: RIGHT SHOULDER ARTHROSCOPY WITH LABRAL REPAIR ;  Surgeon: Eugenia Mcalpine, MD;  Location: [facility];  Service: Orthopedics;  Laterality: Right;  . UPPER GI ENDOSCOPY  2015   esophageal dilation   Family History  Problem Relation Age of Onset  . Lung cancer Maternal Grandfather   . Other Sister        Diabetes Insipidus  . Mitral valve prolapse Father   . Ulcers Sister        peptic  . Emphysema  Paternal Grandfather   . Lung cancer Paternal Grandfather   . Stroke Neg Hx   . Heart disease Neg Hx    Medications- reviewed and updated Current Outpatient Medications  Medication Sig Dispense Refill  . omeprazole (PRILOSEC) 40 MG capsule TAKE 1 CAPSULE (40 MG TOTAL) BY MOUTH 2 (TWO) TIMES DAILY. 60 capsule 1   No current facility-administered medications for this visit.    Allergies-reviewed and updated No Known Allergies  Social History   Socioeconomic History  . Marital status: Divorced    Spouse name: None  . Number of children: 0  . Years of education: None  . Highest education level: None  Social Needs  . Financial resource strain: None  . Food insecurity - worry: None  . Food insecurity - inability: None  . Transportation needs - medical: None  . Transportation needs - non-medical: None  Occupational History  . Occupation: landscaper-self  employed  Tobacco Use  . Smoking status: Never Smoker  . Smokeless tobacco: Never Used  Substance and Sexual Activity  . Alcohol use: Yes    Alcohol/week: 7.0 oz    Types: 14 drink(s) per week    Comment: 2 drinks per day  . Drug use: No  . Sexual activity: None  Other Topics Concern  . None  Social History Narrative  . None    Objective:  Physical Exam: BP 128/72 (BP  Location: Left Arm, Patient Position: Sitting, Cuff Size: Normal)   Pulse 61   Temp 98.4 F (36.9 C) (Oral)   Ht 6' (1.829 m)   Wt 190 lb 12.8 oz (86.5 kg)   SpO2 98%   BMI 25.88 kg/m   Body mass index is 25.88 kg/m. Wt Readings from Last 3 Encounters:  04/19/17 190 lb 12.8 oz (86.5 kg)  02/03/16 190 lb (86.2 kg)  05/05/15 188 lb 1.9 oz (85.3 kg)   Gen: NAD, resting comfortably HEENT: TMs normal bilaterally. OP clear. No thyromegaly noted. Rightward deviated septum noted.  CV: RRR with no murmurs appreciated Pulm: NWOB, CTAB with no crackles, wheezes, or rhonchi GI: Normal bowel sounds present. Soft, Nontender, Nondistended. MSK: no edema,  cyanosis, or clubbing noted Skin: warm, dry Neuro: CN2-12 grossly intact. Strength 5/5 in upper and lower extremities. Reflexes symmetric and intact bilaterally.  Psych: Normal affect and thought content  Assessment/Plan:  Deviated Septum Refer to ENT.  GERD Continue omeprazole. Check CBC, CMET.   Preventative Healthcare: Check lipid panel and PSA.   Patient Counseling:  -Nutrition: Stressed importance of moderation in sodium/caffeine intake, saturated fat and cholesterol, caloric balance, sufficient intake of fresh fruits, vegetables, and fiber.  -Stressed the importance of regular exercise.   -Substance Abuse: Discussed cessation/primary prevention of tobacco, alcohol, or other drug use; driving or other dangerous activities under the influence; availability of treatment for abuse.   -Injury prevention: Discussed safety belts, safety helmets, smoke detector, smoking near bedding or upholstery.   -Sexuality: Discussed sexually transmitted diseases, partner selection, use of condoms, avoidance of unintended pregnancy and contraceptive alternatives.   -Dental health: Discussed importance of regular tooth brushing, flossing, and dental visits.  -Health maintenance and immunizations reviewed. Please refer to Health maintenance section.  Return to care in 1 year for next preventative visit.   Katina Degreealeb M. Jimmey RalphParker, MD 04/19/2017 10:35 AM

## 2017-04-19 NOTE — Patient Instructions (Signed)

## 2017-05-11 ENCOUNTER — Ambulatory Visit (INDEPENDENT_AMBULATORY_CARE_PROVIDER_SITE_OTHER): Payer: BLUE CROSS/BLUE SHIELD | Admitting: Gastroenterology

## 2017-05-11 ENCOUNTER — Encounter: Payer: Self-pay | Admitting: Gastroenterology

## 2017-05-11 VITALS — BP 128/74 | HR 69 | Ht 72.0 in | Wt 193.0 lb

## 2017-05-11 DIAGNOSIS — Z1211 Encounter for screening for malignant neoplasm of colon: Secondary | ICD-10-CM | POA: Diagnosis not present

## 2017-05-11 DIAGNOSIS — K219 Gastro-esophageal reflux disease without esophagitis: Secondary | ICD-10-CM

## 2017-05-11 DIAGNOSIS — Z1212 Encounter for screening for malignant neoplasm of rectum: Secondary | ICD-10-CM | POA: Diagnosis not present

## 2017-05-11 MED ORDER — OMEPRAZOLE 40 MG PO CPDR: DELAYED_RELEASE_CAPSULE | ORAL | 11 refills | Status: DC

## 2017-05-11 NOTE — Progress Notes (Signed)
    History of Present Illness: This is a 47 year old male with a history of GERD and esophageal stricture.  He states his reflux symptoms are under excellent control.  He denies dysphasia.  He often misses his evening dose of omeprazole and notes no difference in symptoms although he has not tried once daily for prolonged period of time.  Last EGD performed in April 2015 with dilation of an esophageal stricture.  Current Medications, Allergies, Past Medical History, Past Surgical History, Family History and Social History were reviewed in [facility] electronic medical record.  Physical Exam: General: Well developed, well nourished, no acute distress Head: Normocephalic and atraumatic Eyes:  sclerae anicteric, EOMI Ears: Normal auditory acuity Mouth: No deformity or lesions Lungs: Clear throughout to auscultation Heart: Regular rate and rhythm; no murmurs, rubs or bruits Abdomen: Soft, non tender and non distended. No masses, hepatosplenomegaly or hernias noted. Normal Bowel sounds Musculoskeletal: Symmetrical with no gross deformities  Pulses:  Normal pulses noted Extremities: No clubbing, cyanosis, edema or deformities noted Neurological: Alert oriented x 4, grossly nonfocal Psychological:  Alert and cooperative. Normal mood and affect  Assessment and Recommendations:  1. GERD with a history of an esophageal stricture.  Omeprazole 40 mg daily and standard antireflux measures.  If symptoms are not well controlled on once daily omeprazole he is advised to call the office and we will change his prescription back to twice daily.  2.  CRC screening average risk.  A 10-year interval screening colonoscopy is due in 05/2022.

## 2017-05-11 NOTE — Patient Instructions (Signed)
We have sent the following medications to your pharmacy for you to pick up at your convenience: omeprazole 40 mg daily.   Please call our office if your symptoms are not under control with once daily omeprazole.   Thank you for choosing me and [facility].  Venita LickMalcolm T. Pleas KochStark, Jr., MD., Clementeen GrahamFACG

## 2017-05-23 DIAGNOSIS — J342 Deviated nasal septum: Secondary | ICD-10-CM | POA: Diagnosis not present

## 2017-05-23 DIAGNOSIS — J310 Chronic rhinitis: Secondary | ICD-10-CM | POA: Diagnosis not present

## 2017-05-23 DIAGNOSIS — J343 Hypertrophy of nasal turbinates: Secondary | ICD-10-CM | POA: Diagnosis not present

## 2017-09-05 ENCOUNTER — Telehealth: Payer: Self-pay | Admitting: Gastroenterology

## 2017-09-05 MED ORDER — OMEPRAZOLE 40 MG PO CPDR: DELAYED_RELEASE_CAPSULE | ORAL | 8 refills | Status: DC

## 2017-09-05 NOTE — Telephone Encounter (Signed)
Patient states he was told to call back by Dr. Russella DarStark if he needs a new prescription for omeprazole 40 mg twice daily as he was previously taking. Patient states he has been taking it twice daily and is about to run out of his current prescription. Informed patient I will send in omeprazole 40 mg twice daily to [facility]. Patient verbalized understanding.

## 2017-09-27 ENCOUNTER — Other Ambulatory Visit: Payer: Self-pay

## 2017-09-27 ENCOUNTER — Telehealth: Payer: Self-pay

## 2017-09-27 DIAGNOSIS — R0683 Snoring: Secondary | ICD-10-CM

## 2017-09-27 DIAGNOSIS — G4730 Sleep apnea, unspecified: Secondary | ICD-10-CM

## 2017-09-27 NOTE — Telephone Encounter (Signed)
Ok with me. Please place any necessary orders. 

## 2017-09-27 NOTE — Telephone Encounter (Signed)
Copied from CRM [phone]#[id number]. Topic: Referral - Request >> Sep 26, 2017 12:24 PM Jacob Buchanan, Jacob Buchanan wrote: Pt has been seen by ENT and has been diagnosed with Sleep apnea and is going to need to get a referral for a sleep study  From his PCP it  needs to be for [facility]   Best number [phone]

## 2017-09-27 NOTE — Telephone Encounter (Signed)
Referral has been placed. 

## 2017-09-27 NOTE — Telephone Encounter (Signed)
Please advise 

## 2017-10-23 ENCOUNTER — Ambulatory Visit (INDEPENDENT_AMBULATORY_CARE_PROVIDER_SITE_OTHER): Payer: BLUE CROSS/BLUE SHIELD | Admitting: Family Medicine

## 2017-10-23 ENCOUNTER — Encounter: Payer: Self-pay | Admitting: Family Medicine

## 2017-10-23 VITALS — BP 130/72 | HR 74 | Temp 98.7°F | Ht 72.0 in | Wt 184.0 lb

## 2017-10-23 DIAGNOSIS — M791 Myalgia, unspecified site: Secondary | ICD-10-CM

## 2017-10-23 DIAGNOSIS — J3489 Other specified disorders of nose and nasal sinuses: Secondary | ICD-10-CM

## 2017-10-23 MED ORDER — IPRATROPIUM BROMIDE 0.06 % NA SOLN: 2.0000 | Freq: Four times a day (QID) | NASAL | 0 refills | Status: DC

## 2017-10-23 MED ORDER — DOXYCYCLINE HYCLATE 100 MG PO TABS: 100.0000 mg | ORAL_TABLET | Freq: Two times a day (BID) | ORAL | 0 refills | Status: DC

## 2017-10-23 NOTE — Patient Instructions (Signed)
Start the atrovent.  Start the doxycycline if your symptoms worsen or do not improve in a few days.  Please stay well hydrated.  You can take tylenol and/or motrin as needed for low grade fever and pain.  Please let me know if your symptoms worsen or fail to improve.  Take care, Dr Parker  

## 2017-10-23 NOTE — Progress Notes (Signed)
   Subjective:  Jacob Buchanan is a 47 y.o. male who presents today for same-day appointment with a chief complaint of myalgias.   HPI:  Myalgia, Acute problem Started a few days ago. Associated sore throat, fatigue, rhinorrhea, swollen glands, and sneezing. No fevers. Tried theraflu which has helped some. No known sick contacts. Has a few red spots on his abdomen that he thinks are insect bites. No other obvious alleviating or aggravating factors.   ROS: Per HPI  PMH: He reports that he has never smoked. He has never used smokeless tobacco. He reports that he drinks about 14.0 standard drinks of alcohol per week. He reports that he does not use drugs.  Objective:  Physical Exam: BP 130/72 (BP Location: Left Arm, Patient Position: Sitting, Cuff Size: Normal)   Pulse 74   Temp 98.7 F (37.1 C) (Oral)   Ht 6' (1.829 m)   Wt 184 lb (83.5 kg)   SpO2 97%   BMI 24.95 kg/m   Gen: NAD, resting comfortably HEENT: OP erythematous without exudate.  CV: RRR with no murmurs appreciated Pulm: NWOB, CTAB with no crackles, wheezes, or rhonchi Skin: Raised, erythematous, excoriated lesions scattered across trunk.   Assessment/Plan:  Myalgia/Rhinorrhea Likely secondary to viral URI. No signs of bacterial infection. Start atrovent for rhinorrhea/sinus congestion.  Sent in a "pocket prescription" for doxyxycline with strict instruction to not start unless symptoms worsen or fail to improve within the next several days. Recommended tylenol and/or motrin as needed for low grade fever and pain. Encouraged good oral hydration. Return precautions reviewed. Follow up as needed.   Katina Degreealeb M. Jimmey RalphParker, MD 10/23/2017 11:09 AM

## 2017-10-31 ENCOUNTER — Other Ambulatory Visit (HOSPITAL_COMMUNITY)
Admission: RE | Admit: 2017-10-31 | Discharge: 2017-10-31 | Disposition: A | Discharge status: Home / Self Care | Payer: BLUE CROSS/BLUE SHIELD | Source: Ambulatory Visit | Attending: Family Medicine | Admitting: Family Medicine

## 2017-10-31 ENCOUNTER — Encounter: Payer: Self-pay | Admitting: Family Medicine

## 2017-10-31 ENCOUNTER — Ambulatory Visit (INDEPENDENT_AMBULATORY_CARE_PROVIDER_SITE_OTHER): Payer: BLUE CROSS/BLUE SHIELD | Admitting: Family Medicine

## 2017-10-31 VITALS — BP 134/78 | HR 79 | Temp 98.6°F | Ht 72.0 in | Wt 184.0 lb

## 2017-10-31 DIAGNOSIS — R21 Rash and other nonspecific skin eruption: Secondary | ICD-10-CM | POA: Diagnosis not present

## 2017-10-31 DIAGNOSIS — Z113 Encounter for screening for infections with a predominantly sexual mode of transmission: Secondary | ICD-10-CM | POA: Insufficient documentation

## 2017-10-31 MED ORDER — VALACYCLOVIR HCL 1 G PO TABS: 1000.0000 mg | ORAL_TABLET | Freq: Two times a day (BID) | ORAL | 0 refills | Status: DC

## 2017-10-31 NOTE — Patient Instructions (Signed)
It was very nice to see you today!  Please start the valtrex. Take 1 pill twice daily for 10 days.  We will check blood work today.  Take care, Dr Jimmey RalphParker

## 2017-10-31 NOTE — Progress Notes (Signed)
   Subjective:  Jacob Buchanan is a 47 y.o. male who presents today with a chief complaint of rash.   HPI:  Rash, established problem Patient seen ago for URI symptoms and rash in his abdominal area.  He was diagnosed with viral URI and insect bites.  He was started on Atrovent.  His URI symptoms have resolved however his rash has worsened.  Now predominantly involves his genital and suprapubic area.  Rash is very painful.  He has tried cortisone cream which has not helped.  He is also tried Neosporin which has not helped either.  Other obvious alleviating or aggravating factors.  ROS: Per HPI  PMH: He reports that he has never smoked. He has never used smokeless tobacco. He reports that he drinks about 14.0 standard drinks of alcohol per week. He reports that he does not use drugs.  Objective:  Physical Exam: BP 134/78 (BP Location: Left Arm, Patient Position: Sitting, Cuff Size: Normal)   Pulse 79   Temp 98.6 F (37 C) (Oral)   Ht 6' (1.829 m)   Wt 184 lb (83.5 kg)   SpO2 96%   BMI 24.95 kg/m   Gen: NAD, resting comfortably CV: RRR with no murmurs appreciated Pulm: NWOB, CTAB with no crackles, wheezes, or rhonchi GU: Vesicular rash involving penis and suprapubic area  Assessment/Plan:  Rash Lesions are characteristic of HSV infection.  Patient declines any past history of genital herpes.  We will start a course of Valtrex 1 g twice daily for the next 10 days.  We will also check HSV titers.  Discussed with patient that this likely represents HSV infection which is a sexually transmitted infection.  Discussed reasons return to care.  Screening for STD As noted above, patient's rash likely represents HSV.  Will check HSV titers, HIV antibody, RPR, and urine gonorrhea/chlamydia.  Katina Degreealeb M. Jimmey RalphParker, MD 10/31/2017 10:13 AM

## 2017-11-01 LAB — URINE CYTOLOGY ANCILLARY ONLY
CHLAMYDIA, DNA PROBE: NEGATIVE
NEISSERIA GONORRHEA: NEGATIVE
Trichomonas: NEGATIVE

## 2017-11-01 LAB — HIV ANTIBODY (ROUTINE TESTING W REFLEX): HIV 1&2 Ab, 4th Generation: NONREACTIVE

## 2017-11-01 LAB — HSV(HERPES SIMPLEX VRS) I + II AB-IGG: HAV 1 IGG,TYPE SPECIFIC AB: <0.9 index

## 2017-11-01 LAB — RPR: RPR Ser Ql: NONREACTIVE

## 2017-11-02 NOTE — Progress Notes (Signed)
Dr Lavone NeriParker's interpretation of your lab work:  All of your STD tests were negative. You do no have HIV, gonorrhea, chlamydia, trichomonas, or syphilis.  Your herpes test was negative, however it can sometimes take several weeks for this to show up in the blood tests after your first outbreak. Please continue the valtrex as we discussed and let me know if your symptoms are not improving over the next week.   If you have any additional questions, please give us a call or send us a message through [facility].  Take care, Dr Jimmey RalphParker

## 2017-11-09 ENCOUNTER — Encounter: Payer: Self-pay | Admitting: Family Medicine

## 2017-11-09 ENCOUNTER — Ambulatory Visit (INDEPENDENT_AMBULATORY_CARE_PROVIDER_SITE_OTHER): Payer: BLUE CROSS/BLUE SHIELD | Admitting: Family Medicine

## 2017-11-09 VITALS — BP 130/82 | HR 71 | Temp 98.7°F | Ht 72.0 in | Wt 186.0 lb

## 2017-11-09 DIAGNOSIS — R21 Rash and other nonspecific skin eruption: Secondary | ICD-10-CM

## 2017-11-09 NOTE — Patient Instructions (Addendum)
It was very nice to see you today!  STOP the valtrex.  Use benadryl for your itching.  Use cortisone cream as needed.  Let me know if your symptoms do not improve over the next few days.   Take care, Dr [name]

## 2017-11-09 NOTE — Progress Notes (Signed)
   Subjective:  Jacob Buchanan is a 47 y.o. male who presents today for same-day appointment with a chief complaint of rash.   HPI:  Rash, Acute problem Patient seen about a week ago and was diagnosed with genital herpes.  He was started on Valtrex and his rash is improved.  The last few days, he has had worsening rash distributed throughout the rest of his body.  Now has lesions on his lower extremities and back.  The lesions are very painful and pruritic.  Seem to be worsening.  No treatments tried.  No other obvious alleviating or aggravating factors.  ROS: Per HPI  PMH: He reports that he has never smoked. He has never used smokeless tobacco. He reports that he drinks about 14.0 standard drinks of alcohol per week. He reports that he does not use drugs.  Objective:  Physical Exam: BP 130/82 (BP Location: Left Arm, Patient Position: Sitting, Cuff Size: Normal)   Pulse 71   Temp 98.7 F (37.1 C) (Oral)   Ht 6' (1.829 m)   Wt 186 lb (84.4 kg)   SpO2 97%   BMI 25.23 kg/m   Gen: NAD, resting comfortably Skin: Scattered erythematous papules along lower extremities and lower back with several excoriations noted.  No surrounding erythema.  No drainage.  Assessment/Plan:  Rash Most consistent with allergic reaction to Valtrex.  Would not expect disseminated HSV given that he has been treated for Valtrex with a week.  Similarly, would not expect shingles or zoster outbreak given that he has been treated with Valtrex.  Advised patient to stop his Valtrex.  Recommended use of Benadryl as needed for his itching.  Also recommended topical cortisone for areas that are severely pruritic.  Discussed reasons to return to care.  Consider biopsy if symptoms worsen or do not improve.  [name]. [name], MD 11/09/2017 3:11 PM

## 2017-11-20 ENCOUNTER — Telehealth: Payer: Self-pay

## 2017-11-20 ENCOUNTER — Institutional Professional Consult (permissible substitution): Payer: Self-pay | Admitting: Neurology

## 2017-11-20 NOTE — Telephone Encounter (Signed)
Pt did not show for their appt with Dr. Athar today.  

## 2017-11-21 ENCOUNTER — Encounter: Payer: Self-pay | Admitting: Neurology

## 2017-11-21 DIAGNOSIS — Z0289 Encounter for other administrative examinations: Secondary | ICD-10-CM

## 2017-12-11 ENCOUNTER — Ambulatory Visit (INDEPENDENT_AMBULATORY_CARE_PROVIDER_SITE_OTHER): Payer: BLUE CROSS/BLUE SHIELD | Admitting: Family Medicine

## 2017-12-11 ENCOUNTER — Encounter: Payer: Self-pay | Admitting: Family Medicine

## 2017-12-11 VITALS — BP 128/74 | HR 65 | Temp 98.7°F | Ht 72.0 in | Wt 186.0 lb

## 2017-12-11 DIAGNOSIS — R21 Rash and other nonspecific skin eruption: Secondary | ICD-10-CM | POA: Diagnosis not present

## 2017-12-11 MED ORDER — ACYCLOVIR 400 MG PO TABS: 400.0000 mg | ORAL_TABLET | Freq: Three times a day (TID) | ORAL | 0 refills | Status: AC

## 2017-12-11 NOTE — Progress Notes (Signed)
   Subjective:  Jacob Buchanan is a 47 y.o. male who presents today with a chief complaint of rash.   HPI:  Rash, chronic problem, worsening Patient was seen about a month ago for this.  At that time, he had what we thought was an allergic reaction to valacyclovir that he was taking for presumed genital herpes outbreak.  He had a diffuse erythematous rash.  The rash has since resolved after he stopped taking his Valtrex, however over the last week or 2 he has had recurrent rash in his genital area.  It is very pruritic.  Feels similar to his last outbreak of genital herpes.  ROS: Per HPI  PMH: He reports that he has never smoked. He has never used smokeless tobacco. He reports that he drinks about 14.0 standard drinks of alcohol per week. He reports that he does not use drugs.  Objective:  Physical Exam: BP 128/74 (BP Location: Left Arm, Patient Position: Sitting, Cuff Size: Normal)   Pulse 65   Temp 98.7 F (37.1 C) (Oral)   Ht 6' (1.829 m)   Wt 186 lb (84.4 kg)   SpO2 95%   BMI 25.23 kg/m   Gen: NAD, resting comfortably Skin: Erythematous patches on left and right base of penis.  Assessment/Plan:  Rash Possibly consistent with early genital herpes outbreak.  He had a adverse reaction to Valtrex in the past.  We will start a course of acyclovir.  Discussed with patient that he should let us know if he had any return of systemic or diffuse rash.  Patient voiced understanding.  Given the location of his rash and its atypical appearance, we will also place referral to dermatology for further evaluation.  May ultimately need biopsy.  Discussed reasons return to care.  Follow-up as needed.  [name]. [name], MD 12/11/2017 4:27 PM

## 2017-12-11 NOTE — Patient Instructions (Addendum)
It was very nice to see you today!  Please start the acyclovir if your symptoms do not improve.  I will also place a referral to dermatology.   Take care, Dr [name]   Genital Herpes Genital herpes is a common sexually transmitted infection (STI) that is caused by a virus. The virus spreads from person to person through sexual contact. Infection can cause itching, blisters, and sores around the genitals or rectum. Symptoms may last several days and then go away This is called an outbreak. However, the virus remains in your body, so you may have more outbreaks in the future. The time between outbreaks varies and can be months or years. Genital herpes affects men and women. It is particularly concerning for pregnant women because the virus can be passed to the baby during delivery and can cause serious problems. Genital herpes is also a concern for people who have a weak disease-fighting (immune) system. What are the causes? This condition is caused by the herpes simplex virus (HSV) type 1 or type 2. The virus may spread through:  Sexual contact with an infected person, including vaginal, anal, and oral sex.  Contact with fluid from a herpes sore.  The skin. This means that you can get herpes from an infected partner even if he or she does not have a visible sore or does not know that he or she is infected.  What increases the risk? You are more likely to develop this condition if:  You have sex with many partners.  You do not use latex condoms during sex.  What are the signs or symptoms? Most people do not have symptoms (asymptomatic) or have mild symptoms that may be mistaken for other skin problems. Symptoms may include:  Small red bumps near the genitals, rectum, or mouth. These bumps turn into blisters and then turn into sores.  Flu-like symptoms, including: ? Fever. ? Body aches. ? Swollen lymph nodes. ? Headache.  Painful urination.  Pain and itching in the genital area  or rectal area.  Vaginal discharge.  Tingling or shooting pain in the legs and buttocks.  Generally, symptoms are more severe and last longer during the first (primary) outbreak. Flu-like symptoms are also more common during the primary outbreak. How is this diagnosed? Genital herpes may be diagnosed based on:  A physical exam.  Your medical history.  Blood tests.  A test of a fluid sample (culture) from an open sore.  How is this treated? There is no cure for this condition, but treatment with antiviral medicines that are taken by mouth (orally) can do the following:  Speed up healing and relieve symptoms.  Help to reduce the spread of the virus to sexual partners.  Limit the chance of future outbreaks, or make future outbreaks shorter.  Lessen symptoms of future outbreaks.  Your health care provider may also recommend pain relief medicines, such as aspirin or ibuprofen. Follow these instructions at home: Sexual activity  Do not have sexual contact during active outbreaks.  Practice safe sex. Latex condoms and male condoms may help prevent the spread of the herpes virus. General instructions  Keep the affected areas dry and clean.  Take over-the-counter and prescription medicines only as told by your health care provider.  Avoid rubbing or touching blisters and sores. If you do touch blisters or sores: ? Wash your hands thoroughly with soap and water. ? Do not touch your eyes afterward.  To help relieve pain or itching, you may take the  following actions as directed by your health care provider: ? Apply a cold, wet cloth (cold compress) to affected areas 4-6 times a day. ? Apply a substance that protects your skin and reduces bleeding (astringent). ? Apply a gel that helps relieve pain around sores (lidocaine gel). ? Take a warm, shallow bath that cleans the genital area (sitz bath).  Keep all follow-up visits as told by your health care provider. This is  important. How is this prevented?  Use condoms. Although anyone can get genital herpes during sexual contact, even with the use of a condom, a condom can provide some protection.  Avoid having multiple sexual partners.  Talk with your sexual partner about any symptoms either of you may have. Also, talk with your partner about any history of STIs.  Get tested for STIs before you have sex. Ask your partner to do the same.  Do not have sexual contact if you have symptoms of genital herpes. Contact a health care provider if:  Your symptoms are not improving with medicine.  Your symptoms return.  You have new symptoms.  You have a fever.  You have abdominal pain.  You have redness, swelling, or pain in your eye.  You notice new sores on other parts of your body.  You are a woman and experience bleeding between menstrual periods.  You have had herpes and you become pregnant or plan to become pregnant. Summary  Genital herpes is a common sexually transmitted infection (STI) that is caused by the herpes simplex virus (HSV) type 1 or type 2.  These viruses are most often spread through sexual contact with an infected person.  You are more likely to develop this condition if you have sex with many partners or you have unprotected sex.  Most people do not have symptoms (asymptomatic) or have mild symptoms that may be mistaken for other skin problems. Symptoms occur as outbreaks that may happen months or years apart.  There is no cure for this condition, but treatment with oral antiviral medicines can reduce symptoms, reduce the chance of spreading the virus to a partner, prevent future outbreaks, or shorten future outbreaks. This information is not intended to replace advice given to you by your health care provider. Make sure you discuss any questions you have with your health care provider. Document Released: 02/19/2000 Document Revised: 01/22/2016 Document Reviewed:  01/22/2016 Elsevier Interactive Patient Education  [employer].

## 2018-01-16 ENCOUNTER — Ambulatory Visit (INDEPENDENT_AMBULATORY_CARE_PROVIDER_SITE_OTHER): Payer: BLUE CROSS/BLUE SHIELD | Admitting: Neurology

## 2018-01-16 ENCOUNTER — Encounter: Payer: Self-pay | Admitting: Neurology

## 2018-01-16 VITALS — BP 152/95 | HR 58 | Ht 74.0 in | Wt 191.0 lb

## 2018-01-16 DIAGNOSIS — R351 Nocturia: Secondary | ICD-10-CM | POA: Diagnosis not present

## 2018-01-16 DIAGNOSIS — R0683 Snoring: Secondary | ICD-10-CM

## 2018-01-16 DIAGNOSIS — G4719 Other hypersomnia: Secondary | ICD-10-CM

## 2018-01-16 DIAGNOSIS — G2581 Restless legs syndrome: Secondary | ICD-10-CM

## 2018-01-16 DIAGNOSIS — R0681 Apnea, not elsewhere classified: Secondary | ICD-10-CM

## 2018-01-16 NOTE — Progress Notes (Signed)
Subjective:    Patient ID: Jacob Buchanan is a 47 y.o. male.  HPI     Huston Foley, MD, PhD [facility] [address] [address] [place], [place] [address]  Dear Dr. [name],   I saw your patient, Jacob Buchanan, upon your kind request in my neurologic clinic today for initial consultation of his sleep disorder, in particular, concern for underlying obstructive sleep apnea. The patient is unaccompanied today. Of note, he no showed for an appointment on 11/20/2017. As you know, Jacob Buchanan is a 47 year old right-handed gentleman with an underlying medical history of reflux disease, history of esophageal stricture with status post esophageal dilatation about 2 years ago, hyperlipidemia, incomplete right bundle branch block and right rotator cuff tear on the right, who reports snoring and excessive daytime somnolence, as well as witnessed apneas. He has woken himself up with a sense of gasping for air. He has been to ENT recently and was told he had a septal deviation but not enough to proceed with surgery. He was encouraged to seek sleep consultation. His Epworth sleepiness score is 11 out of 24, fatigue score is 26 out of 63. He has not been fully rested for the past year or year and a half. He tries to achieve about 7-8 hours of sleep on an average night, bedtime is between 10 and 11 and rise time between 7 and 8. 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 He is divorced, no children, lives alone. He is a nonsmoker, consumes alcohol daily, 2 servings on average, smokes marijuana, once a week or so, caffeine mostly in the form of coffee, 2 servings per day on average. He is not aware of any family history of OSA, father does snore. He has nocturia about once per average night, denies morning headaches, has occasional restless leg symptoms, maybe once a month or so. Not sure if he moves his legs and twitches his feet while asleep. He is a restless sleeper.   His  Past Medical History Is Significant For: Past Medical History:  Diagnosis Date  . ED (erectile dysfunction)   . Esophageal stricture   . GERD (gastroesophageal reflux disease)   . Gilbert syndrome   . Hyperlipidemia   . Incomplete right bundle branch block   . Partial tear of right rotator cuff     His Past Surgical History Is Significant For: Past Surgical History:  Procedure Laterality Date  . COLONOSCOPY  2004 ; 2007; & 05-29-2012   last one with propofol and hemorroidectomy with sclerosing  . FINGER SURGERY  1994   repair/ reconstruction right index crush injury  . INGUINAL HERNIA REPAIR Bilateral age 58  . SHOULDER ARTHROSCOPY WITH LABRAL REPAIR Right 12/31/2013   Procedure: RIGHT SHOULDER ARTHROSCOPY WITH LABRAL REPAIR ;  Surgeon: Eugenia Mcalpine, MD;  Location: [facility];  Service: Orthopedics;  Laterality: Right;  . UPPER GI ENDOSCOPY  2015   esophageal dilation    His Family History Is Significant For: Family History  Problem Relation Age of Onset  . Lung cancer Maternal Grandfather   . Other Sister        Diabetes Insipidus  . Mitral valve prolapse Father   . Ulcers Sister        peptic  . Emphysema Paternal Grandfather   . Lung cancer Paternal Grandfather   . Stroke Neg Hx   . Heart disease Neg Hx     His Social History Is Significant For: Social History  Socioeconomic History  . Marital status: Divorced    Spouse name: Not on file  . Number of children: 0  . Years of education: Not on file  . Highest education level: Not on file  Occupational History  . Occupation: landscaper-self  employed  Social Needs  . Financial resource strain: Not on file  . Food insecurity:    Worry: Not on file    Inability: Not on file  . Transportation needs:    Medical: Not on file    Non-medical: Not on file  Tobacco Use  . Smoking status: Never Smoker  . Smokeless tobacco: Never Used  Substance and Sexual Activity  . Alcohol use: Yes     Alcohol/week: 14.0 standard drinks    Types: 14 Standard drinks or equivalent per week    Comment: 2 drinks per day  . Drug use: No  . Sexual activity: Not on file  Lifestyle  . Physical activity:    Days per week: Not on file    Minutes per session: Not on file  . Stress: Not on file  Relationships  . Social connections:    Talks on phone: Not on file    Gets together: Not on file    Attends religious service: Not on file    Active member of club or organization: Not on file    Attends meetings of clubs or organizations: Not on file    Relationship status: Not on file  Other Topics Concern  . Not on file  Social History Narrative  . Not on file    His Allergies Are:  No Known Allergies:   His Current Medications Are:  Outpatient Encounter Medications as of 01/16/2018  Medication Sig  . omeprazole (PRILOSEC) 40 MG capsule TAKE 1 CAPSULE (40 MG TOTAL) BY MOUTH TWICE DAILY   No facility-administered encounter medications on file as of 01/16/2018.   :  Review of Systems:  Out of a complete 14 point review of systems, all are reviewed and negative with the exception of these symptoms as listed below:  Review of Systems  Neurological:       Pt presents today to discuss his sleep. Pt has never had a sleep study but does endorse snoring.  Epworth Sleepiness Scale 0= would never doze 1= slight chance of dozing 2= moderate chance of dozing 3= high chance of dozing  Sitting and reading: 1 Watching TV: 1 Sitting inactive in a public place (ex. Theater or meeting): 2 As a passenger in a car for an hour without a break: 2 Lying down to rest in the afternoon: 2 Sitting and talking to someone: 0 Sitting quietly after lunch (no alcohol): 2 In a car, while stopped in traffic: 1 Total: 11     Objective:  Neurological Exam  Physical Exam Physical Examination:   Vitals:   01/16/18 1328  BP: (!) 152/95  Pulse: (!) 58    General Examination: The patient is a very  pleasant 47 y.o. male in no acute distress. He appears well-developed and well-nourished and well groomed.   HEENT: Normocephalic, atraumatic, pupils are equal, round and reactive to light and accommodation. Extraocular tracking is good without limitation to gaze excursion or nystagmus noted. Normal smooth pursuit is noted. Hearing is grossly intact. Face is symmetric with normal facial animation and normal facial sensation. Speech is clear with no dysarthria noted. There is no hypophonia. There is no lip, neck/head, jaw or voice tremor. Neck is supple with full range of passive  and active motion. There are no carotid bruits on auscultation. Oropharynx exam reveals: mild mouth dryness, adequate dental hygiene and moderate airway crowding, due to smaller airway entry, larger uvula, wider tongue and thicker tongue. Mallampati is class II. Neck circumference is 16-1/4 inches. He has a minimal overbite. Tongue protrudes centrally and palate elevates symmetrically. Nasal inspection reveals septal deviation to the right.  Chest: Clear to auscultation without wheezing, rhonchi or crackles noted.  Heart: S1+S2+0, regular and normal without murmurs, rubs or gallops noted.   Abdomen: Soft, non-tender and non-distended with normal bowel sounds appreciated on auscultation.  Extremities: There is no edema in the distal lower extremities bilaterally. Skin: Warm and dry without trophic changes noted.  Musculoskeletal: exam reveals no obvious joint deformities, tenderness or joint swelling or erythema.   Neurologically:  Mental status: The patient is awake, alert and oriented in all 4 spheres. His immediate and remote memory, attention, language skills and fund of knowledge are appropriate. There is no evidence of aphasia, agnosia, apraxia or anomia. Speech is clear with normal prosody and enunciation. Thought process is linear. Mood is normal and affect is normal.  Cranial nerves II - XII are as described above  under HEENT exam. In addition: shoulder shrug is normal with equal shoulder height noted. Motor exam: Normal bulk, strength and tone is noted. There is no tremor. Romberg is negative. Fine motor skills and coordination: intact with normal finger taps, normal hand movements, normal rapid alternating patting, normal foot taps and normal foot agility.  Cerebellar testing: No dysmetria or intention tremor. There is no truncal or gait ataxia.  Sensory exam: intact to light touch.  Gait, station and balance: He stands easily. No veering to one side is noted. No leaning to one side is noted. Posture is age-appropriate and stance is narrow based. Gait shows normal stride length and normal pace. No problems turning are noted. Tandem walk is unremarkable.   Assessment and Plan:   In summary, Jacob Buchanan is a very pleasant 47 y.o.-year old male with an underlying medical history of reflux disease, history of esophageal stricture with status post esophageal dilatation about 2 years ago, hyperlipidemia, incomplete right bundle branch block and right rotator cuff tear on the right, whose history and physical exam are concerning for obstructive sleep apnea (OSA). I had a long chat with the patient about my findings and the diagnosis of OSA, its prognosis and treatment options. We talked about medical treatments, surgical interventions and non-pharmacological approaches. I explained in particular the risks and ramifications of untreated moderate to severe OSA, especially with respect to developing cardiovascular disease down the Road, including congestive heart failure, difficult to treat hypertension, cardiac arrhythmias, or stroke. Even type 2 diabetes has, in part, been linked to untreated OSA. Symptoms of untreated OSA include daytime sleepiness, memory problems, mood irritability and mood disorder such as depression and anxiety, lack of energy, as well as recurrent headaches, especially morning headaches. We  talked about trying to maintain a healthy lifestyle in general, as well as the importance of weight control. I encouraged the patient to eat healthy, exercise daily and keep well hydrated, to keep a scheduled bedtime and wake time routine, to not skip any meals and eat healthy snacks in between meals. I advised the patient not to drive when feeling sleepy. I recommended the following at this time: sleep study with potential positive airway pressure titration. (We will score hypopneas at 3%).   I explained the sleep test procedure to the  patient and also outlined possible surgical and non-surgical treatment options of OSA, including the use of a custom-made dental device (which would require a referral to a specialist dentist or oral surgeon), upper airway surgical options, such as pillar implants, radiofrequency surgery, tongue base surgery, and UPPP (which would involve a referral to an ENT surgeon). Rarely, jaw surgery such as mandibular advancement may be considered.  I also explained the CPAP treatment option to the patient, who indicated that he would be willing to try CPAP if the need arises. I explained the importance of being compliant with PAP treatment, not only for insurance purposes but primarily to improve His symptoms, and for the patient's long term health benefit, including to reduce His cardiovascular risks. I answered all his questions today and the patient was in agreement. I would like to see him back after the sleep study is completed and encouraged him to call with any interim questions, concerns, problems or updates.   Thank you very much for allowing me to participate in the care of this nice patient. If I can be of any further assistance to you please do not hesitate to call me at [phone].  Sincerely,   Huston FoleySaima Sharry Beining, MD, PhD

## 2018-01-16 NOTE — Patient Instructions (Signed)

## 2018-02-07 ENCOUNTER — Ambulatory Visit (INDEPENDENT_AMBULATORY_CARE_PROVIDER_SITE_OTHER): Payer: BLUE CROSS/BLUE SHIELD | Admitting: Neurology

## 2018-02-07 DIAGNOSIS — R0683 Snoring: Secondary | ICD-10-CM

## 2018-02-07 DIAGNOSIS — R0681 Apnea, not elsewhere classified: Secondary | ICD-10-CM

## 2018-02-07 DIAGNOSIS — G4719 Other hypersomnia: Secondary | ICD-10-CM

## 2018-02-07 DIAGNOSIS — G2581 Restless legs syndrome: Secondary | ICD-10-CM

## 2018-02-07 DIAGNOSIS — G4733 Obstructive sleep apnea (adult) (pediatric): Secondary | ICD-10-CM

## 2018-02-07 DIAGNOSIS — R351 Nocturia: Secondary | ICD-10-CM

## 2018-02-09 NOTE — Procedures (Signed)
[facility] [address] [place], [place] [id number] NAME:  Miquel DunnStephen Stayer, MD                               DOB: 1971/02/24 MEDICAL RECORD no:   [id number]                      DOS:  02/07/2018 REFERRING PHYSICIAN: Jacquiline Doealeb Parker, MD STUDY PERFORMED: Home Sleep Test on [facility] HISTORY: 47 year old man with a history of reflux disease, esophageal stricture with s/p esophageal dilatation, hyperlipidemia, incomplete right bundle branch block and right rotator cuff tear, who reports snoring and excessive daytime somnolence, as well as witnessed apneas. His Epworth sleepiness score is 11 out of 24, BMI of 24.6.  STUDY RESULTS:  Total Recording Time: 7 hrs, 44 mins, Total Sleep Time: 6 hrs, 48 mins Total Apnea/Hypopnea Index (AHI): 75.9/h, RDI: 76.1/h Average Oxygen Saturation: 94%, Lowest Oxygen Desaturation:  75%  Total Time Oxygen Saturation Below or at 88 %:  18.4 minutes  Average Heart Rate:   65 bpm  IMPRESSION: Severe OSA RECOMMENDATION: This home sleep test demonstrates severe obstructive sleep apnea with a total AHI of 75.9/hour and O2 nadir of 75%. Given the patient's medical history and sleep related complaints, treatment with positive airway pressure (in the form of CPAP) is recommended. This will require a full night CPAP titration study for proper treatment settings, O2 monitoring and mask fitting. Based on the severity of the sleep disordered breathing an attended titration study is indicated. However, patient's insurance has denied an attended sleep study; therefore, the patient will be advised to proceed with an autoPAP titration/trial at home for now. Please note that untreated obstructive sleep apnea may carry additional perioperative morbidity. Patients with significant obstructive sleep apnea should receive perioperative PAP therapy and the surgeons and particularly the anesthesiologist should be informed of the diagnosis and the severity of the  sleep disordered breathing. The patient should be cautioned not to drive, work at heights, or operate dangerous or heavy equipment when tired or sleepy. Review and reiteration of good sleep hygiene measures should be pursued with any patient. Other causes of the patient's symptoms, including circadian rhythm disturbances, an underlying mood disorder, medication effect and/or an underlying medical problem cannot be ruled out based on this test. Clinical correlation is recommended. The patient and his referring provider will be notified of the test results. [facility]. I certify that I have reviewed the raw data recording prior to the issuance of this report in accordance with the standards of the American Academy of Sleep Medicine (AASM).  Huston FoleySaima Amiah Frohlich, MD, PhD [facility] ([facility]), Diplomat, ABPN (Neurology and Sleep)

## 2018-02-09 NOTE — Addendum Note (Signed)
Addended by: Huston FoleyATHAR, Jaley Yan on: 02/09/2018 10:41 AM   Modules accepted: Orders

## 2018-02-09 NOTE — Progress Notes (Signed)
Patient referred by Dr. Jimmey RalphParker, seen by me on 01/16/18, HST on 02/07/18.    Please call and notify the patient that the recent home sleep test showed obstructive sleep apnea in the severe range. While I recommend treatment for this in the form CPAP, his insurance will not approve a sleep study for this. They will likely only approve a trial of autoPAP, which means, that we don't have to bring him in for a sleep study with CPAP, but will let him try an autoPAP machine at home, through a DME company (of his choice, or as per insurance requirement). The DME representative will educate him on how to use the machine, how to put the mask on, etc. I have placed an order in the chart. Please send referral, talk to patient, send report to referring MD. We will need a FU in sleep clinic for 10 weeks post-PAP set up, please arrange that with me or one of our NPs. Thanks,   Huston FoleySaima Deola Rewis, MD, PhD [facility])

## 2018-02-12 ENCOUNTER — Telehealth: Payer: Self-pay

## 2018-02-12 NOTE — Telephone Encounter (Signed)
I called pt to discuss his sleep study. No answer, left a message asking him to call me back.

## 2018-02-12 NOTE — Telephone Encounter (Signed)
Pt returned my call and I discussed his sleep study results with him. Pt would rather try an oral appliance first and is wondering if Dr. Frances FurbishAthar is agreeable to that. Pt verbalized understanding of results.

## 2018-02-12 NOTE — Telephone Encounter (Signed)
-----   Message from Huston FoleySaima Athar, MD sent at 02/09/2018 10:41 AM EST ----- Patient referred by Dr. Jimmey RalphParker, seen by me on 01/16/18, HST on 02/07/18.    Please call and notify the patient that the recent home sleep test showed obstructive sleep apnea in the severe range. While I recommend treatment for this in the form CPAP, his insurance will not approve a sleep study for this. They will likely only approve a trial of autoPAP, which means, that we don't have to bring him in for a sleep study with CPAP, but will let him try an autoPAP machine at home, through a DME company (of his choice, or as per insurance requirement). The DME representative will educate him on how to use the machine, how to put the mask on, etc. I have placed an order in the chart. Please send referral, talk to patient, send report to referring MD. We will need a FU in sleep clinic for 10 weeks post-PAP set up, please arrange that with me or one of our NPs. Thanks,   Huston FoleySaima Athar, MD, PhD [facility])

## 2018-02-12 NOTE — Telephone Encounter (Signed)
Severe OSA is best treated with an autoPAP or CPAP. If he wishes, he can talk to his dentist about an oral appliance for treatment of OSA. If needed, we can make a referral. Please call pt back.

## 2018-02-12 NOTE — Telephone Encounter (Signed)
I called pt. I reiterated that severe osa is best treated with PAP therapy. Pt still would like to discuss an oral appliance with his dentist and will let us know if he needs a referral to another dentist that is able to make an oral appliance. Pt verbalized understanding of results. Pt had no questions at this time but was encouraged to call back if questions arise.

## 2018-02-13 ENCOUNTER — Telehealth: Payer: Self-pay | Admitting: Neurology

## 2018-02-13 NOTE — Telephone Encounter (Signed)
Pt left a message stating he was ready to get set up on pap therapy.

## 2018-02-14 NOTE — Telephone Encounter (Signed)
I called pt. He reports that he would prefer to start an auto pap rather than an oral appliance right now for his osa. I reviewed PAP compliance expectations with the pt. Pt is agreeable to starting an auto-PAP. I advised pt that an order will be sent to a DME, [facility], and [facility] will call the pt within about one week after they file with the pt's insurance. [facility] will show the pt how to use the machine, fit for masks, and troubleshoot the auto-PAP if needed. A follow up appt was made for insurance purposes with Eber Jonesarolyn, NP on 05/08/18 at 11:15am. Pt verbalized understanding to arrive 15 minutes early and bring their auto-PAP. A letter with all of this information in it will be mailed to the pt as a reminder. I verified with the pt that the address we have on file is correct. Pt verbalized understanding of results. Pt had no questions at this time but was encouraged to call back if questions arise. I have sent the order to [facility] and have received confirmation that they have received the order.

## 2018-02-20 DIAGNOSIS — G4733 Obstructive sleep apnea (adult) (pediatric): Secondary | ICD-10-CM | POA: Diagnosis not present

## 2018-03-13 ENCOUNTER — Other Ambulatory Visit: Payer: Self-pay

## 2018-03-13 ENCOUNTER — Telehealth: Payer: Self-pay | Admitting: Family Medicine

## 2018-03-13 MED ORDER — ACYCLOVIR 400 MG PO TABS: 400.0000 mg | ORAL_TABLET | Freq: Three times a day (TID) | ORAL | 0 refills | Status: AC

## 2018-03-13 NOTE — Telephone Encounter (Signed)
See note

## 2018-03-13 NOTE — Telephone Encounter (Signed)
Pt requesting refill of Acyclovir 40 mg tablet. Medication not on current med list. Last prescribed 12/11/17 #21

## 2018-03-13 NOTE — Telephone Encounter (Signed)
Rx sent to pharmacy   

## 2018-03-13 NOTE — Telephone Encounter (Signed)
Copied from CRM [id number]. Topic: Quick Communication - Rx Refill/Question >> Mar 13, 2018  9:03 AM Wyonia Hough E wrote: Medication: acyclovir (ZOVIRAX) 400 MG tablet   Has the patient contacted their pharmacy? No  Preferred Pharmacy (with phone number or street name): [facility] [address] [place] - [address] [place] [place] [phone] (Phone) [phone] (Fax)    Agent: Please be advised that RX refills may take up to 3 business days. We ask that you follow-up with your pharmacy.

## 2018-03-21 ENCOUNTER — Telehealth: Payer: Self-pay | Admitting: Neurology

## 2018-03-21 NOTE — Telephone Encounter (Signed)
Pt left me a message stating he has turned his cpap machine back in to [facility]. He stated he has tried using it and has done multiple mask fittings but he still could not tolerate the machine. He would like to talk about the possibility of getting set up for a dental device. Pt had a hst on 02/07/18

## 2018-03-21 NOTE — Telephone Encounter (Signed)
If his dentist does not provide treatment for sleep apnea with a dental device, we can certainly help with a referral to [facility], Dr. Toni Arthurs or Dr. Myrtis Ser, as per pt preference. Please call pt back.

## 2018-03-21 NOTE — Telephone Encounter (Signed)
I have already discussed with the pt that severe osa is best treated with a cpap.  Per Dr. Teofilo Pod note from 02/12/18: "Severe OSA is best treated with an autoPAP or CPAP. If he wishes, he can talk to his dentist about an oral appliance for treatment of OSA. If needed, we can make a referral. Please call pt back."

## 2018-03-22 NOTE — Telephone Encounter (Signed)
I called pt to discuss. No answer, left a message asking him to call me back. 

## 2018-03-22 NOTE — Telephone Encounter (Signed)
Pt returned my call. I advised him of Dr. Teofilo PodAthar's recommendations. He will discuss an oral appliance with his dentist and call us back if he needs a referral to a local dentist that makes oral appliances. Pt verbalized understanding of recommendations.

## 2018-03-26 ENCOUNTER — Other Ambulatory Visit: Payer: Self-pay

## 2018-03-26 ENCOUNTER — Other Ambulatory Visit: Payer: Self-pay | Admitting: *Deleted

## 2018-03-26 ENCOUNTER — Other Ambulatory Visit: Payer: Self-pay | Admitting: Family Medicine

## 2018-03-26 MED ORDER — ACYCLOVIR 400 MG PO TABS: 400.0000 mg | ORAL_TABLET | Freq: Three times a day (TID) | ORAL | 0 refills | Status: AC

## 2018-03-26 NOTE — Telephone Encounter (Signed)
Copied from CRM [id number]. Topic: Quick Communication - Rx Refill/Question >> Mar 26, 2018 10:34 AM Gean Birchwood R wrote: Medication:  acyclovir (ZOVIRAX) 400 MG tablet   Has the patient contacted their pharmacy? Yes  Preferred Pharmacy (with phone number or street name): [facility] [address], [place] - [address] [place] [place] [phone] (Phone) [phone] (Fax)   Agent: Please be advised that RX refills may take up to 3 business days. We ask that you follow-up with your pharmacy.

## 2018-03-26 NOTE — Telephone Encounter (Signed)
Pt. Reports he wants to keep Zovirax on hand to have as needed. Medication is not on medication list, so triage could not refill it.

## 2018-03-26 NOTE — Telephone Encounter (Signed)
Rx sent to pharmacy   

## 2018-03-26 NOTE — Telephone Encounter (Signed)
Office refilled medication 03/13/18.

## 2018-03-26 NOTE — Telephone Encounter (Signed)
See note

## 2018-05-08 ENCOUNTER — Ambulatory Visit: Payer: Self-pay | Admitting: Nurse Practitioner

## 2018-05-09 ENCOUNTER — Other Ambulatory Visit: Payer: Self-pay

## 2018-05-09 MED ORDER — OMEPRAZOLE 40 MG PO CPDR: DELAYED_RELEASE_CAPSULE | ORAL | 8 refills | Status: DC

## 2018-05-09 MED ORDER — ACYCLOVIR 400 MG PO TABS: 400.0000 mg | ORAL_TABLET | Freq: Three times a day (TID) | ORAL | 0 refills | Status: DC

## 2018-05-09 NOTE — Telephone Encounter (Signed)
Can you check with patient? The documentation is for acyclovir but the pended med is omeprazole.  Ok with refilling either one, just want to make sure we have the right medication.  [name]. [name], MD 05/09/2018 7:53 AM

## 2018-06-18 ENCOUNTER — Other Ambulatory Visit: Payer: Self-pay | Admitting: Family Medicine

## 2018-06-18 NOTE — Telephone Encounter (Signed)
[facility] called and spoke to [facility] about the refill request, she says he has plenty of refills. Request denied.

## 2018-06-20 DIAGNOSIS — G4733 Obstructive sleep apnea (adult) (pediatric): Secondary | ICD-10-CM | POA: Diagnosis not present

## 2018-06-29 ENCOUNTER — Other Ambulatory Visit: Payer: Self-pay | Admitting: Family Medicine

## 2018-06-29 MED ORDER — ACYCLOVIR 400 MG PO TABS: 400.0000 mg | ORAL_TABLET | Freq: Three times a day (TID) | ORAL | 0 refills | Status: AC

## 2018-06-29 NOTE — Telephone Encounter (Signed)
See note

## 2018-06-29 NOTE — Telephone Encounter (Signed)
Acyclovir 400 mg not on med list.  Last refilled on 05/09/18 Dr. [name]

## 2018-07-16 ENCOUNTER — Telehealth: Payer: Self-pay | Admitting: Family Medicine

## 2018-07-16 ENCOUNTER — Other Ambulatory Visit: Payer: Self-pay

## 2018-07-16 MED ORDER — ACYCLOVIR 400 MG PO TABS: 400.0000 mg | ORAL_TABLET | Freq: Three times a day (TID) | ORAL | 0 refills | Status: AC

## 2018-07-16 NOTE — Telephone Encounter (Signed)
Rx sent to pharmacy   

## 2018-07-16 NOTE — Telephone Encounter (Signed)
See request °

## 2018-07-16 NOTE — Telephone Encounter (Signed)
Copied from CRM [id number]. Topic: Quick Communication - Rx Refill/Question >> Jul 16, 2018  2:32 PM Jaquita Rector A wrote: Medication: acyclovir (ZOVIRAX) 400 MG tablet   Has the patient contacted their pharmacy? Yes.   (Agent: If no, request that the patient contact the pharmacy for the refill.) (Agent: If yes, when and what did the pharmacy advise?)  Preferred Pharmacy (with phone number or street name): [facility] [address], [place] - [address] [place] [place] [phone] (Phone) [phone] (Fax)    Agent: Please be advised that RX refills may take up to 3 business days. We ask that you follow-up with your pharmacy.

## 2018-08-25 ENCOUNTER — Other Ambulatory Visit: Payer: Self-pay | Admitting: Family Medicine

## 2018-10-24 ENCOUNTER — Other Ambulatory Visit: Payer: Self-pay | Admitting: Family Medicine

## 2018-12-19 ENCOUNTER — Other Ambulatory Visit: Payer: Self-pay | Admitting: Family Medicine

## 2019-01-23 ENCOUNTER — Other Ambulatory Visit: Payer: Self-pay

## 2019-01-23 DIAGNOSIS — Z20822 Contact with and (suspected) exposure to covid-19: Secondary | ICD-10-CM

## 2019-01-25 LAB — NOVEL CORONAVIRUS, NAA: SARS-CoV-2, NAA: NOT DETECTED

## 2019-04-26 ENCOUNTER — Ambulatory Visit (INDEPENDENT_AMBULATORY_CARE_PROVIDER_SITE_OTHER): Payer: BC Managed Care – PPO | Admitting: Family Medicine

## 2019-04-26 ENCOUNTER — Other Ambulatory Visit: Payer: Self-pay

## 2019-04-26 DIAGNOSIS — K219 Gastro-esophageal reflux disease without esophagitis: Secondary | ICD-10-CM

## 2019-04-26 DIAGNOSIS — B009 Herpesviral infection, unspecified: Secondary | ICD-10-CM | POA: Insufficient documentation

## 2019-04-26 MED ORDER — VALACYCLOVIR HCL 500 MG PO TABS: 500.0000 mg | ORAL_TABLET | Freq: Two times a day (BID) | ORAL | 3 refills | Status: AC

## 2019-04-26 MED ORDER — OMEPRAZOLE 40 MG PO CPDR: DELAYED_RELEASE_CAPSULE | ORAL | 11 refills | Status: DC

## 2019-04-26 NOTE — Progress Notes (Signed)
   Jacob Buchanan is a 49 y.o. male who presents today for a virtual office visit.  Assessment/Plan:  Chronic Problems Addressed Today: Recurrent herpes simplex Stable.  No current outbreak.  Will send in prescription for Valtrex 50 mg twice daily x3 days to use for outbreaks.  GERD (gastroesophageal reflux disease) Stable.  Will refill omeprazole today;.     Subjective:  HPI:  See A/P.         Objective/Observations  Physical Exam: Gen: NAD, resting comfortably Pulm: Normal work of breathing Neuro: Grossly normal, moves all extremities Psych: Normal affect and thought content  Virtual Visit via Video   I connected with Jacob Buchanan on 04/26/19 at 11:40 AM EST by a video enabled telemedicine application and verified that I am speaking with the correct person using two identifiers. The limitations of evaluation and management by telemedicine and the availability of in person appointments were discussed. The patient expressed understanding and agreed to proceed.   Patient location: Home Provider location: [place] [employer] Persons participating in the virtual visit: Myself and Patient     [name]. [name], MD 04/26/2019 12:15 PM

## 2019-04-26 NOTE — Assessment & Plan Note (Signed)
Stable.  Will refill omeprazole today;.

## 2019-04-26 NOTE — Assessment & Plan Note (Signed)
Stable.  No current outbreak.  Will send in prescription for Valtrex 50 mg twice daily x3 days to use for outbreaks.

## 2019-05-21 DIAGNOSIS — D3131 Benign neoplasm of right choroid: Secondary | ICD-10-CM | POA: Diagnosis not present

## 2019-05-28 ENCOUNTER — Encounter: Payer: Self-pay | Admitting: Family Medicine

## 2019-05-28 ENCOUNTER — Other Ambulatory Visit: Payer: Self-pay

## 2019-05-28 ENCOUNTER — Ambulatory Visit (INDEPENDENT_AMBULATORY_CARE_PROVIDER_SITE_OTHER): Payer: BC Managed Care – PPO | Admitting: Family Medicine

## 2019-05-28 ENCOUNTER — Other Ambulatory Visit (HOSPITAL_COMMUNITY)
Admission: RE | Admit: 2019-05-28 | Discharge: 2019-05-28 | Disposition: A | Discharge status: Home / Self Care | Payer: BC Managed Care – PPO | Source: Ambulatory Visit | Attending: Family Medicine | Admitting: Family Medicine

## 2019-05-28 VITALS — BP 132/80 | HR 73 | Temp 98.1°F | Ht 74.0 in | Wt 197.2 lb

## 2019-05-28 DIAGNOSIS — K219 Gastro-esophageal reflux disease without esophagitis: Secondary | ICD-10-CM | POA: Diagnosis not present

## 2019-05-28 DIAGNOSIS — Z125 Encounter for screening for malignant neoplasm of prostate: Secondary | ICD-10-CM

## 2019-05-28 DIAGNOSIS — Z0001 Encounter for general adult medical examination with abnormal findings: Secondary | ICD-10-CM | POA: Diagnosis not present

## 2019-05-28 DIAGNOSIS — E663 Overweight: Secondary | ICD-10-CM

## 2019-05-28 DIAGNOSIS — B009 Herpesviral infection, unspecified: Secondary | ICD-10-CM

## 2019-05-28 DIAGNOSIS — Z113 Encounter for screening for infections with a predominantly sexual mode of transmission: Secondary | ICD-10-CM | POA: Insufficient documentation

## 2019-05-28 DIAGNOSIS — Z6825 Body mass index (BMI) 25.0-25.9, adult: Secondary | ICD-10-CM

## 2019-05-28 DIAGNOSIS — E785 Hyperlipidemia, unspecified: Secondary | ICD-10-CM | POA: Diagnosis not present

## 2019-05-28 MED ORDER — VALACYCLOVIR HCL 1 G PO TABS: 1000.0000 mg | ORAL_TABLET | Freq: Every day | ORAL | 3 refills | Status: DC

## 2019-05-28 NOTE — Progress Notes (Signed)
Chief Complaint:  Jacob Buchanan is a 49 y.o. male who presents today for his annual comprehensive physical exam.    Assessment/Plan:  Chronic Problems Addressed Today: Dyslipidemia Check lipid panel, CBC, C met, TSH.  Recurrent herpes simplex Start suppressive therapy with 1000 mg Valtrex daily.  GERD (gastroesophageal reflux disease) Continue Prilosec 40 mg daily.  Body mass index is 25.32 kg/m. / Overweight BMI Metric Follow Up - 05/28/19 1512      BMI Metric Follow Up-Please document annually   BMI Metric Follow Up  Education provided       Preventative Healthcare: Check CBC, C met, TSH, lipid panel, HIV, RPR.  Check urine gonorrhea chlamydia.  We will screen for STDs today per patient request.  Patient Counseling(The following topics were reviewed and/or handout was given):  -Nutrition: Stressed importance of moderation in sodium/caffeine intake, saturated fat and cholesterol, caloric balance, sufficient intake of fresh fruits, vegetables, and fiber.  -Stressed the importance of regular exercise.   -Substance Abuse: Discussed cessation/primary prevention of tobacco, alcohol, or other drug use; driving or other dangerous activities under the influence; availability of treatment for abuse.   -Injury prevention: Discussed safety belts, safety helmets, smoke detector, smoking near bedding or upholstery.   -Sexuality: Discussed sexually transmitted diseases, partner selection, use of condoms, avoidance of unintended pregnancy and contraceptive alternatives.   -Dental health: Discussed importance of regular tooth brushing, flossing, and dental visits.  -Health maintenance and immunizations reviewed. Please refer to Health maintenance section.  Return to care in 1 year for next preventative visit.     Subjective:  HPI:  He has no acute complaints today.   He has had a few herpes outbreak since our last visit has had to use Valtrex which is cleared up  symptoms.  Lifestyle Diet: None specific.  Exercise: Limited.   Depression screen [facility] 2/9 05/28/2019  Decreased Interest 0  Down, Depressed, Hopeless 0  PHQ - 2 Score 0   ROS: A complete review of systems was negative.   PMH:  The following were reviewed and entered/updated in epic: Past Medical History:  Diagnosis Date  . ED (erectile dysfunction)   . Esophageal stricture   . GERD (gastroesophageal reflux disease)   . Gilbert syndrome   . Hyperlipidemia   . Incomplete right bundle branch block   . Partial tear of right rotator cuff    Patient Active Problem List   Diagnosis Date Noted  . Recurrent herpes simplex 04/26/2019  . GERD (gastroesophageal reflux disease) 04/19/2017  . Generalized headache 01/13/2015  . Dyslipidemia 09/14/2007  . RHINOCONJUNCTIVITIS, ALLERGIC 09/14/2007  . ASTHMA 09/14/2007   Past Surgical History:  Procedure Laterality Date  . COLONOSCOPY  2004 ; 2007; & 05-29-2012   last one with propofol and hemorroidectomy with sclerosing  . FINGER SURGERY  1994   repair/ reconstruction right index crush injury  . INGUINAL HERNIA REPAIR Bilateral age 1  . SHOULDER ARTHROSCOPY WITH LABRAL REPAIR Right 12/31/2013   Procedure: RIGHT SHOULDER ARTHROSCOPY WITH LABRAL REPAIR ;  Surgeon: Sydnee Cabal, MD;  Location: [facility];  Service: Orthopedics;  Laterality: Right;  . UPPER GI ENDOSCOPY  2015   esophageal dilation   Family History  Problem Relation Age of Onset  . Lung cancer Maternal Grandfather   . Other Sister        Diabetes Insipidus  . Mitral valve prolapse Father   . Ulcers Sister        peptic  . Emphysema Paternal  Grandfather   . Lung cancer Paternal Grandfather   . Stroke Neg Hx   . Heart disease Neg Hx     Medications- reviewed and updated Current Outpatient Medications  Medication Sig Dispense Refill  . omeprazole (PRILOSEC) 40 MG capsule TAKE 1 CAPSULE (40 MG TOTAL) BY MOUTH TWICE DAILY 60 capsule 11  .  valACYclovir (VALTREX) 1000 MG tablet Take 1 tablet (1,000 mg total) by mouth daily. 90 tablet 3   No current facility-administered medications for this visit.    Allergies-reviewed and updated No Known Allergies  Social History   Socioeconomic History  . Marital status: Divorced    Spouse name: Not on file  . Number of children: 0  . Years of education: Not on file  . Highest education level: Not on file  Occupational History  . Occupation: landscaper-self  employed  Tobacco Use  . Smoking status: Never Smoker  . Smokeless tobacco: Never Used  Substance and Sexual Activity  . Alcohol use: Yes    Alcohol/week: 14.0 standard drinks    Types: 14 Standard drinks or equivalent per week    Comment: 2 drinks per day  . Drug use: No  . Sexual activity: Not on file  Other Topics Concern  . Not on file  Social History Narrative  . Not on file   Social Determinants of Health   Financial Resource Strain:   . Difficulty of Paying Living Expenses:   Food Insecurity:   . Worried About Charity fundraiser in the Last Year:   . Arboriculturist in the Last Year:   Transportation Needs:   . Film/video editor (Medical):   Marland Kitchen Lack of Transportation (Non-Medical):   Physical Activity:   . Days of Exercise per Week:   . Minutes of Exercise per Session:   Stress:   . Feeling of Stress :   Social Connections:   . Frequency of Communication with Friends and Family:   . Frequency of Social Gatherings with Friends and Family:   . Attends Religious Services:   . Active Member of Clubs or Organizations:   . Attends Archivist Meetings:   Marland Kitchen Marital Status:         Objective:  Physical Exam: BP 132/80   Pulse 73   Temp 98.1 F (36.7 C)   Ht 6' 2"  (1.88 m)   Wt 197 lb 3.2 oz (89.4 kg)   SpO2 97%   BMI 25.32 kg/m   Body mass index is 25.32 kg/m. Wt Readings from Last 3 Encounters:  05/28/19 197 lb 3.2 oz (89.4 kg)  01/16/18 191 lb (86.6 kg)  12/11/17 186 lb  (84.4 kg)   Gen: NAD, resting comfortably HEENT: TMs normal bilaterally. OP clear. No thyromegaly noted.  CV: RRR with no murmurs appreciated Pulm: NWOB, CTAB with no crackles, wheezes, or rhonchi GI: Normal bowel sounds present. Soft, Nontender, Nondistended. MSK: no edema, cyanosis, or clubbing noted Skin: warm, dry Neuro: CN2-12 grossly intact. Strength 5/5 in upper and lower extremities. Reflexes symmetric and intact bilaterally.  Psych: Normal affect and thought content     Vernel Donlan M. Jerline Pain, MD 05/28/2019 3:12 PM

## 2019-05-28 NOTE — Patient Instructions (Signed)
It was very nice to see you today!  Please take the Valtrex daily.  Let me know if you have outbreaks after this.  We will check blood work and urine sample today.  Come back in 1 year for your next physical with blood work, or sooner if needed.  Take care, Dr Jerline Pain  Please try these tips to maintain a healthy lifestyle:   Eat at least 3 REAL meals and 1-2 snacks per day.  Aim for no more than 5 hours between eating.  If you eat breakfast, please do so within one hour of getting up.    Each meal should contain half fruits/vegetables, one quarter protein, and one quarter carbs (no bigger than a computer mouse)   Cut down on sweet beverages. This includes juice, soda, and sweet tea.     Drink at least 1 glass of water with each meal and aim for at least 8 glasses per day   Exercise at least 150 minutes every week.    Preventive Care 51-60 Years Old, Male Preventive care refers to lifestyle choices and visits with your health care provider that can promote health and wellness. This includes:  A yearly physical exam. This is also called an annual well check.  Regular dental and eye exams.  Immunizations.  Screening for certain conditions.  Healthy lifestyle choices, such as eating a healthy diet, getting regular exercise, not using drugs or products that contain nicotine and tobacco, and limiting alcohol use. What can I expect for my preventive care visit? Physical exam Your health care provider will check:  Height and weight. These may be used to calculate body mass index (BMI), which is a measurement that tells if you are at a healthy weight.  Heart rate and blood pressure.  Your skin for abnormal spots. Counseling Your health care provider may ask you questions about:  Alcohol, tobacco, and drug use.  Emotional well-being.  Home and relationship well-being.  Sexual activity.  Eating habits.  Work and work Statistician. What immunizations do I  need?  Influenza (flu) vaccine  This is recommended every year. Tetanus, diphtheria, and pertussis (Tdap) vaccine  You may need a Td booster every 10 years. Varicella (chickenpox) vaccine  You may need this vaccine if you have not already been vaccinated. Zoster (shingles) vaccine  You may need this after age 8. Measles, mumps, and rubella (MMR) vaccine  You may need at least one dose of MMR if you were born in 1957 or later. You may also need a second dose. Pneumococcal conjugate (PCV13) vaccine  You may need this if you have certain conditions and were not previously vaccinated. Pneumococcal polysaccharide (PPSV23) vaccine  You may need one or two doses if you smoke cigarettes or if you have certain conditions. Meningococcal conjugate (MenACWY) vaccine  You may need this if you have certain conditions. Hepatitis A vaccine  You may need this if you have certain conditions or if you travel or work in places where you may be exposed to hepatitis A. Hepatitis B vaccine  You may need this if you have certain conditions or if you travel or work in places where you may be exposed to hepatitis B. Haemophilus influenzae type b (Hib) vaccine  You may need this if you have certain risk factors. Human papillomavirus (HPV) vaccine  If recommended by your health care provider, you may need three doses over 6 months. You may receive vaccines as individual doses or as more than one vaccine together in  one shot (combination vaccines). Talk with your health care provider about the risks and benefits of combination vaccines. What tests do I need? Blood tests  Lipid and cholesterol levels. These may be checked every 5 years, or more frequently if you are over 51 years old.  Hepatitis C test.  Hepatitis B test. Screening  Lung cancer screening. You may have this screening every year starting at age 51 if you have a 30-pack-year history of smoking and currently smoke or have quit within  the past 15 years.  Prostate cancer screening. Recommendations will vary depending on your family history and other risks.  Colorectal cancer screening. All adults should have this screening starting at age 51 and continuing until age 51. Your health care provider may recommend screening at age 51 if you are at increased risk. You will have tests every 1-10 years, depending on your results and the type of screening test.  Diabetes screening. This is done by checking your blood sugar (glucose) after you have not eaten for a while (fasting). You may have this done every 1-3 years.  Sexually transmitted disease (STD) testing. Follow these instructions at home: Eating and drinking  Eat a diet that includes fresh fruits and vegetables, whole grains, lean protein, and low-fat dairy products.  Take vitamin and mineral supplements as recommended by your health care provider.  Do not drink alcohol if your health care provider tells you not to drink.  If you drink alcohol: ? Limit how much you have to 0-2 drinks a day. ? Be aware of how much alcohol is in your drink. In the [place] one drink equals one 12 oz bottle of beer (355 mL), one 5 oz glass of wine (148 mL), or one 1 oz glass of hard liquor (44 mL). Lifestyle  Take daily care of your teeth and gums.  Stay active. Exercise for at least 30 minutes on 5 or more days each week.  Do not use any products that contain nicotine or tobacco, such as cigarettes, e-cigarettes, and chewing tobacco. If you need help quitting, ask your health care provider.  If you are sexually active, practice safe sex. Use a condom or other form of protection to prevent STIs (sexually transmitted infections).  Talk with your health care provider about taking a low-dose aspirin every day starting at age 51. What's next?  Go to your health care provider once a year for a well check visit.  Ask your health care provider how often you should have your eyes and teeth  checked.  Stay up to date on all vaccines. This information is not intended to replace advice given to you by your health care provider. Make sure you discuss any questions you have with your health care provider. Document Revised: 02/15/2018 Document Reviewed: 02/15/2018 Elsevier Patient Education  2020 [employer].

## 2019-05-28 NOTE — Assessment & Plan Note (Signed)
Check lipid panel, CBC, C met, TSH.

## 2019-05-28 NOTE — Assessment & Plan Note (Signed)
Start suppressive therapy with 1000 mg Valtrex daily.

## 2019-05-28 NOTE — Assessment & Plan Note (Signed)
Continue Prilosec 40 mg daily.

## 2019-05-29 LAB — PSA: PSA: 1.38 ng/mL (ref 0.10–4.00)

## 2019-05-29 LAB — HIV ANTIBODY (ROUTINE TESTING W REFLEX): HIV 1&2 Ab, 4th Generation: NONREACTIVE

## 2019-05-29 LAB — CBC
HCT: 32.2 % — ABNORMAL LOW (ref 39.0–52.0)
Hemoglobin: 10 g/dL — ABNORMAL LOW (ref 13.0–17.0)
MCHC: 31.1 g/dL (ref 30.0–36.0)
MCV: 68.9 fl — ABNORMAL LOW (ref 78.0–100.0)
Platelets: 282 10*3/uL (ref 150.0–400.0)
RBC: 4.67 Mil/uL (ref 4.22–5.81)
RDW: 17.9 % — ABNORMAL HIGH (ref 11.5–15.5)
WBC: 6.1 10*3/uL (ref 4.0–10.5)

## 2019-05-29 LAB — LIPID PANEL
Cholesterol: 179 mg/dL (ref 0–200)
HDL: 35.9 mg/dL — ABNORMAL LOW (ref >39.00)
NonHDL: 143.01
Total CHOL/HDL Ratio: 5
Triglycerides: 252 mg/dL — ABNORMAL HIGH (ref 0.0–149.0)
VLDL: 50.4 mg/dL — ABNORMAL HIGH (ref 0.0–40.0)

## 2019-05-29 LAB — COMPREHENSIVE METABOLIC PANEL
ALT: 27 U/L (ref 0–53)
AST: 24 U/L (ref 0–37)
Albumin: 4.1 g/dL (ref 3.5–5.2)
Alkaline Phosphatase: 93 U/L (ref 39–117)
BUN: 16 mg/dL (ref 6–23)
CO2: 27 mEq/L (ref 19–32)
Calcium: 9 mg/dL (ref 8.4–10.5)
Chloride: 102 mEq/L (ref 96–112)
Creatinine, Ser: 0.95 mg/dL (ref 0.40–1.50)
GFR: 83.55 mL/min (ref >60.00)
Glucose, Bld: 123 mg/dL — ABNORMAL HIGH (ref 70–99)
Potassium: 4 mEq/L (ref 3.5–5.1)
Sodium: 137 mEq/L (ref 135–145)
Total Bilirubin: 0.4 mg/dL (ref 0.2–1.2)
Total Protein: 7 g/dL (ref 6.0–8.3)

## 2019-05-29 LAB — LDL CHOLESTEROL, DIRECT: Direct LDL: 94 mg/dL

## 2019-05-29 LAB — RPR: RPR Ser Ql: NONREACTIVE

## 2019-05-30 ENCOUNTER — Encounter: Payer: Self-pay | Admitting: Family Medicine

## 2019-05-30 DIAGNOSIS — R739 Hyperglycemia, unspecified: Secondary | ICD-10-CM | POA: Insufficient documentation

## 2019-05-30 LAB — URINE CYTOLOGY ANCILLARY ONLY
Chlamydia: NEGATIVE
Comment: NEGATIVE
Comment: NORMAL
Neisseria Gonorrhea: NEGATIVE

## 2019-05-30 NOTE — Progress Notes (Signed)
Please inform patient of the following:  Blood counts are low but stable. Recommend he follow up with GI ASAP.  Blood sugar is high but the rest of his labs are all stable.  Would like for him to continue working on diet and exercise and we can recheck in a year or so.  [name]. [name], MD 05/30/2019 1:18 PM

## 2019-07-03 ENCOUNTER — Encounter: Payer: Self-pay | Admitting: Gastroenterology

## 2019-07-03 ENCOUNTER — Other Ambulatory Visit (INDEPENDENT_AMBULATORY_CARE_PROVIDER_SITE_OTHER): Payer: BC Managed Care – PPO

## 2019-07-03 ENCOUNTER — Ambulatory Visit (INDEPENDENT_AMBULATORY_CARE_PROVIDER_SITE_OTHER): Payer: BC Managed Care – PPO | Admitting: Gastroenterology

## 2019-07-03 VITALS — BP 132/80 | HR 76 | Temp 97.8°F | Ht 74.0 in | Wt 195.0 lb

## 2019-07-03 DIAGNOSIS — K219 Gastro-esophageal reflux disease without esophagitis: Secondary | ICD-10-CM

## 2019-07-03 DIAGNOSIS — D509 Iron deficiency anemia, unspecified: Secondary | ICD-10-CM

## 2019-07-03 LAB — IBC + FERRITIN
Ferritin: 6 ng/mL — ABNORMAL LOW (ref 22.0–322.0)
Iron: 22 ug/dL — ABNORMAL LOW (ref 42–165)
Saturation Ratios: 3.7 % — ABNORMAL LOW (ref 20.0–50.0)
Transferrin: 422 mg/dL — ABNORMAL HIGH (ref 212.0–360.0)

## 2019-07-03 LAB — VITAMIN B12: Vitamin B-12: 399 pg/mL (ref 211–911)

## 2019-07-03 LAB — FOLATE: Folate: >23.7 ng/mL (ref >5.9)

## 2019-07-03 LAB — IGA: IgA: 298 mg/dL (ref 68–378)

## 2019-07-03 MED ORDER — NA SULFATE-K SULFATE-MG SULF 17.5-3.13-1.6 GM/177ML PO SOLN: 1.0000 | Freq: Once | ORAL | 0 refills | Status: AC

## 2019-07-03 NOTE — Patient Instructions (Signed)
Your provider has requested that you go to the basement level for lab work before leaving today. Press "B" on the elevator. The lab is located at the first door on the left as you exit the elevator.  You have been scheduled for an endoscopy and colonoscopy. Please follow the written instructions given to you at your visit today. Please pick up your prep supplies at the pharmacy within the next 1-3 days. If you use inhalers (even only as needed), please bring them with you on the day of your procedure.  Due to recent changes in healthcare laws, you may see the results of your imaging and laboratory studies on MyChart before your provider has had a chance to review them.  We understand that in some cases there may be results that are confusing or concerning to you. Not all laboratory results come back in the same time frame and the provider may be waiting for multiple results in order to interpret others.  Please give [place] 48 hours in order for your provider to thoroughly review all the results before contacting the office for clarification of your results.   Normal BMI (Body Mass Index- based on height and weight) is between 19 and 25. Your BMI today is Body mass index is 25.04 kg/m. Marland Kitchen Please consider follow up  regarding your BMI with your Primary Care Provider.  Thank you for choosing me and [facility].  Venita Lick. Pleas Koch., MD., Clementeen Graham

## 2019-07-03 NOTE — Progress Notes (Signed)
History of Present Illness: This is a 49 year old male with a new microcytic anemia. Hgb=10.0, MCV=68.9.  He is asymptomatic.  No gastrointestinal complaints except for occasional reflux.  He relates no prior history of anemia.  He has donated blood on a couple of occasions in the remote past.  Colonoscopy in 2014 showed internal and external hemorrhoids. EGD in 2015 showed an esophageal stricture.  He is maintained on omeprazole 40 mg twice daily however he states he generally takes it once daily unless his symptoms are active and then he resumes twice daily. Denies weight loss, abdominal pain, constipation, diarrhea, change in stool caliber, melena, hematochezia, nausea, vomiting, dysphagia, chest pain.   No Known Allergies Outpatient Medications Prior to Visit  Medication Sig Dispense Refill  . omeprazole (PRILOSEC) 40 MG capsule TAKE 1 CAPSULE (40 MG TOTAL) BY MOUTH TWICE DAILY 60 capsule 11  . valACYclovir (VALTREX) 1000 MG tablet Take 1 tablet (1,000 mg total) by mouth daily. 90 tablet 3   No facility-administered medications prior to visit.   Past Medical History:  Diagnosis Date  . Anemia   . ED (erectile dysfunction)   . Esophageal stricture   . GERD (gastroesophageal reflux disease)   . Gilbert syndrome   . Hyperlipidemia   . Incomplete right bundle branch block   . Partial tear of right rotator cuff    Past Surgical History:  Procedure Laterality Date  . COLONOSCOPY  2004 ; 2007; & 05-29-2012   last one with propofol and hemorroidectomy with sclerosing  . FINGER SURGERY  1994   repair/ reconstruction right index crush injury  . INGUINAL HERNIA REPAIR Bilateral age 81  . SHOULDER ARTHROSCOPY WITH LABRAL REPAIR Right 12/31/2013   Procedure: RIGHT SHOULDER ARTHROSCOPY WITH LABRAL REPAIR ;  Surgeon: Sydnee Cabal, MD;  Location: [facility];  Service: Orthopedics;  Laterality: Right;  . UPPER GI ENDOSCOPY  2015   esophageal dilation   Social History    Socioeconomic History  . Marital status: Divorced    Spouse name: Not on file  . Number of children: 0  . Years of education: Not on file  . Highest education level: Not on file  Occupational History  . Occupation: landscaper-self  employed  Tobacco Use  . Smoking status: Never Smoker  . Smokeless tobacco: Never Used  Substance and Sexual Activity  . Alcohol use: Yes    Alcohol/week: 14.0 standard drinks    Types: 14 Standard drinks or equivalent per week    Comment: 2 drinks per day  . Drug use: No  . Sexual activity: Not on file  Other Topics Concern  . Not on file  Social History Narrative  . Not on file   Social Determinants of Health   Financial Resource Strain:   . Difficulty of Paying Living Expenses:   Food Insecurity:   . Worried About Charity fundraiser in the Last Year:   . Arboriculturist in the Last Year:   Transportation Needs:   . Film/video editor (Medical):   Marland Kitchen Lack of Transportation (Non-Medical):   Physical Activity:   . Days of Exercise per Week:   . Minutes of Exercise per Session:   Stress:   . Feeling of Stress :   Social Connections:   . Frequency of Communication with Friends and Family:   . Frequency of Social Gatherings with Friends and Family:   . Attends Religious Services:   . Active Member of Clubs  or Organizations:   . Attends Banker Meetings:   Marland Kitchen Marital Status:    Family History  Problem Relation Age of Onset  . Lung cancer Maternal Grandfather   . Other Sister        Diabetes Insipidus  . Mitral valve prolapse Father   . Ulcers Sister        peptic  . Emphysema Paternal Grandfather   . Lung cancer Paternal Grandfather   . Stroke Neg Hx   . Heart disease Neg Hx       Physical Exam: General: Well developed, well nourished, no acute distress Head: Normocephalic and atraumatic Eyes:  sclerae anicteric, EOMI Ears: Normal auditory acuity Mouth: Not examined, mask on during Covid-19 pandemic Lungs:  Clear throughout to auscultation Heart: Regular rate and rhythm; no murmurs, rubs or bruits Abdomen: Soft, non tender and non distended. No masses, hepatosplenomegaly or hernias noted. Normal Bowel sounds Rectal: Deferred to colonoscopy  Musculoskeletal: Symmetrical with no gross deformities  Pulses:  Normal pulses noted Extremities: No clubbing, cyanosis, edema or deformities noted Neurological: Alert oriented x 4, grossly nonfocal Psychological:  Alert and cooperative. Normal mood and affect   Assessment and Recommendations:  1.  New microcytic anemia.  Suspected iron deficiency.  Rule out celiac disease and occult gastrointestinal blood loss.  Iron, TIBC, ferritin, B12, folate, tTG, IgA today.  Schedule colonoscopy and EGD. The risks (including bleeding, perforation, infection, missed lesions, medication reactions and possible hospitalization or surgery if complications occur), benefits, and alternatives to colonoscopy with possible biopsy and possible polypectomy were discussed with the patient and they consent to proceed. The risks (including bleeding, perforation, infection, missed lesions, medication reactions and possible hospitalization or surgery if complications occur), benefits, and alternatives to endoscopy with possible biopsy and possible dilation were discussed with the patient and they consent to proceed.   2.  GERD with a history of an esophageal stricture.  Continue omeprazole 40 mg p.o. twice daily.  Continue to reduce to once daily if symptoms are adequately controlled.  Follow standard antireflux measures.

## 2019-07-04 ENCOUNTER — Other Ambulatory Visit: Payer: Self-pay

## 2019-07-04 DIAGNOSIS — D509 Iron deficiency anemia, unspecified: Secondary | ICD-10-CM

## 2019-07-04 LAB — TISSUE TRANSGLUTAMINASE, IGA: (tTG) Ab, IgA: 1 U/mL

## 2019-08-23 ENCOUNTER — Encounter: Payer: Self-pay | Admitting: Gastroenterology

## 2019-09-05 ENCOUNTER — Encounter: Payer: Self-pay | Admitting: Gastroenterology

## 2019-09-05 ENCOUNTER — Other Ambulatory Visit: Payer: Self-pay

## 2019-09-05 ENCOUNTER — Ambulatory Visit (AMBULATORY_SURGERY_CENTER): Payer: BC Managed Care – PPO | Admitting: Gastroenterology

## 2019-09-05 VITALS — BP 125/88 | HR 79 | Temp 97.5°F | Resp 24 | Ht 74.0 in | Wt 195.0 lb

## 2019-09-05 DIAGNOSIS — Z1211 Encounter for screening for malignant neoplasm of colon: Secondary | ICD-10-CM | POA: Diagnosis not present

## 2019-09-05 DIAGNOSIS — K3189 Other diseases of stomach and duodenum: Secondary | ICD-10-CM | POA: Diagnosis not present

## 2019-09-05 DIAGNOSIS — D509 Iron deficiency anemia, unspecified: Secondary | ICD-10-CM

## 2019-09-05 DIAGNOSIS — K222 Esophageal obstruction: Secondary | ICD-10-CM

## 2019-09-05 DIAGNOSIS — K640 First degree hemorrhoids: Secondary | ICD-10-CM | POA: Diagnosis not present

## 2019-09-05 DIAGNOSIS — K219 Gastro-esophageal reflux disease without esophagitis: Secondary | ICD-10-CM

## 2019-09-05 DIAGNOSIS — K2970 Gastritis, unspecified, without bleeding: Secondary | ICD-10-CM | POA: Diagnosis not present

## 2019-09-05 HISTORY — PX: COLONOSCOPY: SHX174

## 2019-09-05 MED ORDER — SODIUM CHLORIDE 0.9 % IV SOLN: 500.0000 mL | Freq: Once | INTRAVENOUS | Status: DC

## 2019-09-05 NOTE — Op Note (Signed)
[facility] Patient Name: Jacob Buchanan Procedure Date: 09/05/2019 10:37 AM MRN: [id number] Endoscopist: Meryl Dare , MD Age: 49 Referring MD:  Date of Birth: 07/06/1970 Gender: Male Account #: [id number] Procedure:                Colonoscopy Indications:              Unexplained iron deficiency anemia Medicines:                Monitored Anesthesia Care Procedure:                Pre-Anesthesia Assessment:                           - Prior to the procedure, a History and Physical                            was performed, and patient medications and                            allergies were reviewed. The patient's tolerance of                            previous anesthesia was also reviewed. The risks                            and benefits of the procedure and the sedation                            options and risks were discussed with the patient.                            All questions were answered, and informed consent                            was obtained. Prior Anticoagulants: The patient has                            taken no previous anticoagulant or antiplatelet                            agents. ASA Grade Assessment: II - A patient with                            mild systemic disease. After reviewing the risks                            and benefits, the patient was deemed in                            satisfactory condition to undergo the procedure.                           After obtaining informed consent, the colonoscope  was passed under direct vision. Throughout the                            procedure, the patient's blood pressure, pulse, and                            oxygen saturations were monitored continuously. The                            Colonoscope was introduced through the anus and                            advanced to the the cecum, identified by                            appendiceal orifice and ileocecal  valve. The                            ileocecal valve, appendiceal orifice, and rectum                            were photographed. The quality of the bowel                            preparation was excellent. The colonoscopy was                            performed without difficulty. The patient tolerated                            the procedure well. Scope In: 10:45:51 AM Scope Out: 10:54:48 AM Scope Withdrawal Time: 0 hours 8 minutes 37 seconds  Total Procedure Duration: 0 hours 8 minutes 57 seconds  Findings:                 The perianal and digital rectal examinations were                            normal.                           Internal hemorrhoids were found during                            retroflexion. The hemorrhoids were small and Grade                            I (internal hemorrhoids that do not prolapse).                           The exam was otherwise without abnormality on                            direct and retroflexion views. Complications:            No immediate complications. Estimated blood loss:  None. Estimated Blood Loss:     Estimated blood loss: none. Impression:               - Internal hemorrhoids.                           - The examination was otherwise normal on direct                            and retroflexion views.                           - No specimens collected. Recommendation:           - Repeat colonoscopy in 10 years for screening                            purposes.                           - Patient has a contact number available for                            emergencies. The signs and symptoms of potential                            delayed complications were discussed with the                            patient. Return to normal activities tomorrow.                            Written discharge instructions were provided to the                            patient.                           - Resume  previous diet.                           - Continue present medications. Meryl Dare, MD 09/05/2019 10:57:02 AM This report has been signed electronically.

## 2019-09-05 NOTE — Op Note (Signed)
[facility] Patient Name: Jacob Buchanan Procedure Date: 09/05/2019 10:37 AM MRN: [id number] Endoscopist: Meryl Dare , MD Age: 49 Referring MD:  Date of Birth: [birth year] Gender: Male Account #: [id number] Procedure:                Upper GI endoscopy Indications:              Unexplained iron deficiency anemia Medicines:                Monitored Anesthesia Care Procedure:                Pre-Anesthesia Assessment:                           - Prior to the procedure, a History and Physical                            was performed, and patient medications and                            allergies were reviewed. The patient's tolerance of                            previous anesthesia was also reviewed. The risks                            and benefits of the procedure and the sedation                            options and risks were discussed with the patient.                            All questions were answered, and informed consent                            was obtained. Prior Anticoagulants: The patient has                            taken no previous anticoagulant or antiplatelet                            agents. ASA Grade Assessment: II - A patient with                            mild systemic disease. After reviewing the risks                            and benefits, the patient was deemed in                            satisfactory condition to undergo the procedure.                           After obtaining informed consent, the endoscope was  passed under direct vision. Throughout the                            procedure, the patient's blood pressure, pulse, and                            oxygen saturations were monitored continuously. The                            Endoscope was introduced through the mouth, and                            advanced to the second part of duodenum. The upper                            GI endoscopy was  accomplished without difficulty.                            The patient tolerated the procedure well. Scope In: Scope Out: Findings:                 One benign-appearing, intrinsic moderate stenosis                            was found at the gastroesophageal junction. This                            stenosis measured 1.4 cm (inner diameter) x less                            than one cm (in length). The stenosis was traversed.                           The exam of the esophagus was otherwise normal.                           Patchy moderate inflammation characterized by                            erythema and granularity was found in the gastric                            fundus and in the gastric body. Biopsies were taken                            with a cold forceps for histology.                           The exam of the stomach was otherwise normal.                           The duodenal bulb and second portion of the  duodenum were normal. Biopsies for histology were                            taken with a cold forceps for evaluation of celiac                            disease. Complications:            No immediate complications. Estimated Blood Loss:     Estimated blood loss was minimal. Impression:               - Benign-appearing esophageal stenosis.                           - Gastritis. Biopsied.                           - Normal duodenal bulb and second portion of the                            duodenum. Biopsied. Recommendation:           - Patient has a contact number available for                            emergencies. The signs and symptoms of potential                            delayed complications were discussed with the                            patient. Return to normal activities tomorrow.                            Written discharge instructions were provided to the                            patient.                           -  Resume previous diet.                           - Continue present medications.                           - Await pathology results. Meryl Dare, MD 09/05/2019 11:15:19 AM This report has been signed electronically.

## 2019-09-05 NOTE — Progress Notes (Signed)
To PACU, VSS. Report to Rn. Physician aware of additional meds. tb 

## 2019-09-05 NOTE — Patient Instructions (Signed)
Handouts on gastritis and hemorrhoids given to you today    YOU HAD AN ENDOSCOPIC PROCEDURE TODAY AT THE [facility]:   Refer to the procedure report that was given to you for any specific questions about what was found during the examination.  If the procedure report does not answer your questions, please call your gastroenterologist to clarify.  If you requested that your care partner not be given the details of your procedure findings, then the procedure report has been included in a sealed envelope for you to review at your convenience later.  YOU SHOULD EXPECT: Some feelings of bloating in the abdomen. Passage of more gas than usual.  Walking can help get rid of the air that was put into your GI tract during the procedure and reduce the bloating. If you had a lower endoscopy (such as a colonoscopy or flexible sigmoidoscopy) you may notice spotting of blood in your stool or on the toilet paper. If you underwent a bowel prep for your procedure, you may not have a normal bowel movement for a few days.  Please Note:  You might notice some irritation and congestion in your nose or some drainage.  This is from the oxygen used during your procedure.  There is no need for concern and it should clear up in a day or so.  SYMPTOMS TO REPORT IMMEDIATELY:   Following lower endoscopy (colonoscopy or flexible sigmoidoscopy):  Excessive amounts of blood in the stool  Significant tenderness or worsening of abdominal pains  Swelling of the abdomen that is new, acute  Fever of 100F or higher   Following upper endoscopy (EGD)  Vomiting of blood or coffee ground material  New chest pain or pain under the shoulder blades  Painful or persistently difficult swallowing  New shortness of breath  Fever of 100F or higher  Black, tarry-looking stools  For urgent or emergent issues, a gastroenterologist can be reached at any hour by calling [phone]. Do not use MyChart messaging for urgent  concerns.    DIET:  We do recommend a small meal at first, but then you may proceed to your regular diet.  Drink plenty of fluids but you should avoid alcoholic beverages for 24 hours.  ACTIVITY:  You should plan to take it easy for the rest of today and you should NOT DRIVE or use heavy machinery until tomorrow (because of the sedation medicines used during the test).    FOLLOW UP: Our staff will call the number listed on your records 48-72 hours following your procedure to check on you and address any questions or concerns that you may have regarding the information given to you following your procedure. If we do not reach you, we will leave a message.  We will attempt to reach you two times.  During this call, we will ask if you have developed any symptoms of COVID 19. If you develop any symptoms (ie: fever, flu-like symptoms, shortness of breath, cough etc.) before then, please call [phone].  If you test positive for Covid 19 in the 2 weeks post procedure, please call and report this information to [place].    If any biopsies were taken you will be contacted by phone or by letter within the next 1-3 weeks.  Please call us at [phone] if you have not heard about the biopsies in 3 weeks.    SIGNATURES/CONFIDENTIALITY: You and/or your care partner have signed paperwork which will be entered into your electronic medical record.  These signatures attest to the fact that that the information above on your After Visit Summary has been reviewed and is understood.  Full responsibility of the confidentiality of this discharge information lies with you and/or your care-partner.

## 2019-09-05 NOTE — Progress Notes (Signed)
Called to room to assist during endoscopic procedure.  Patient ID and intended procedure confirmed with present staff. Received instructions for my participation in the procedure from the performing physician.  

## 2019-09-10 ENCOUNTER — Telehealth: Payer: Self-pay

## 2019-09-10 NOTE — Telephone Encounter (Signed)
°  Follow up Call-  Call back number 09/05/2019  Post procedure Call Back phone  # [phone]  Permission to leave phone message Yes  Some recent data might be hidden     Patient questions:  Do you have a fever, pain , or abdominal swelling? No. Pain Score  0 *  Have you tolerated food without any problems? Yes.    Have you been able to return to your normal activities? Yes.    Do you have any questions about your discharge instructions: Diet   No. Medications  No. Follow up visit  No.  Do you have questions or concerns about your Care? No.  Actions: * If pain score is 4 or above: No action needed, pain <4.  Have you developed a fever since your procedure? No 2.   Have you had an respiratory symptoms (SOB or cough) since your procedure? No  3.   Have you tested positive for COVID 19 since your procedure No  4.   Have you had any family members/close contacts diagnosed with the COVID 19 since your procedure?  No   If yes to any of these questions please route to Laverna Peace, RN and Charlett Lango, RN

## 2019-09-16 ENCOUNTER — Encounter: Payer: Self-pay | Admitting: Gastroenterology

## 2019-10-21 ENCOUNTER — Ambulatory Visit (INDEPENDENT_AMBULATORY_CARE_PROVIDER_SITE_OTHER): Payer: BC Managed Care – PPO | Admitting: Family Medicine

## 2019-10-21 ENCOUNTER — Other Ambulatory Visit: Payer: Self-pay

## 2019-10-21 VITALS — BP 133/84 | HR 70 | Temp 98.0°F | Ht 74.0 in | Wt 194.0 lb

## 2019-10-21 DIAGNOSIS — M79671 Pain in right foot: Secondary | ICD-10-CM

## 2019-10-21 NOTE — Progress Notes (Signed)
   Jacob Buchanan is a 49 y.o. male who presents today for an office visit.  Assessment/Plan:  New/Acute Problems: Morton's neuroma No red flags.  Discussed getting x-ray today however patient declined.  He does not wish to take any prescription medications at this point either.  Discussed importance of good arch support.  Can use over-the-counter meds if needed.  If symptoms worsen or do not improve over the next few weeks would consider referral to sports medicine.  Discussed reasons to return to care.    Subjective:  HPI:  Patient with pain to right foot for the past few months.  He found a tick attached to the area and removed it just prior to the pain starting.  He thinks the tick was attached for about 3 days.  No rash or redness to area.  Tick was located between first and second toe.  Symptoms of been stable.  Pain worse with certain motions.  Pain more on bottom of his foot.       Objective:  Physical Exam: Temp 98 F (36.7 C)   Ht 6\' 2"  (1.88 m)   Wt 194 lb (88 kg)   BMI 24.91 kg/m   Gen: No acute distress, resting comfortably MSK: Right foot without deformities.  Tender to palpation along inferior second metatarsal head.  Palpable click noted between first and second MCP joints with lateral squeeze. Neuro: Grossly normal, moves all extremities Psych: Normal affect and thought content      Aamori Mcmasters M. , MD 10/21/2019 10:49 AM

## 2019-10-21 NOTE — Patient Instructions (Signed)
It was very nice to see you today!  I think you have a Morton's neuroma.  Please make sure that you are having good arch support in your footwear.  Let us know if it worsens and we can send you to see a sports medicine doctor.  Take care, Dr [name]  Please try these tips to maintain a healthy lifestyle:   Eat at least 3 REAL meals and 1-2 snacks per day.  Aim for no more than 5 hours between eating.  If you eat breakfast, please do so within one hour of getting up.    Each meal should contain half fruits/vegetables, one quarter protein, and one quarter carbs (no bigger than a computer mouse)   Cut down on sweet beverages. This includes juice, soda, and sweet tea.     Drink at least 1 glass of water with each meal and aim for at least 8 glasses per day   Exercise at least 150 minutes every week.

## 2019-10-30 DIAGNOSIS — D225 Melanocytic nevi of trunk: Secondary | ICD-10-CM | POA: Diagnosis not present

## 2019-10-30 DIAGNOSIS — S80862A Insect bite (nonvenomous), left lower leg, initial encounter: Secondary | ICD-10-CM | POA: Diagnosis not present

## 2019-10-30 DIAGNOSIS — Z1283 Encounter for screening for malignant neoplasm of skin: Secondary | ICD-10-CM | POA: Diagnosis not present

## 2020-01-25 DIAGNOSIS — Z20822 Contact with and (suspected) exposure to covid-19: Secondary | ICD-10-CM | POA: Diagnosis not present

## 2020-06-12 DIAGNOSIS — D3131 Benign neoplasm of right choroid: Secondary | ICD-10-CM | POA: Diagnosis not present

## 2020-06-16 ENCOUNTER — Other Ambulatory Visit: Payer: Self-pay | Admitting: Family Medicine

## 2020-06-18 ENCOUNTER — Other Ambulatory Visit: Payer: Self-pay

## 2020-06-18 ENCOUNTER — Encounter: Payer: Self-pay | Admitting: Family Medicine

## 2020-06-18 ENCOUNTER — Ambulatory Visit (INDEPENDENT_AMBULATORY_CARE_PROVIDER_SITE_OTHER): Payer: BC Managed Care – PPO | Admitting: Family Medicine

## 2020-06-18 ENCOUNTER — Encounter: Payer: BC Managed Care – PPO | Admitting: Family Medicine

## 2020-06-18 VITALS — BP 131/84 | HR 77 | Temp 98.2°F | Ht 74.0 in | Wt 199.2 lb

## 2020-06-18 DIAGNOSIS — Z23 Encounter for immunization: Secondary | ICD-10-CM

## 2020-06-18 DIAGNOSIS — Z0001 Encounter for general adult medical examination with abnormal findings: Secondary | ICD-10-CM

## 2020-06-18 DIAGNOSIS — Z125 Encounter for screening for malignant neoplasm of prostate: Secondary | ICD-10-CM | POA: Diagnosis not present

## 2020-06-18 DIAGNOSIS — R739 Hyperglycemia, unspecified: Secondary | ICD-10-CM | POA: Diagnosis not present

## 2020-06-18 DIAGNOSIS — E785 Hyperlipidemia, unspecified: Secondary | ICD-10-CM

## 2020-06-18 DIAGNOSIS — E669 Obesity, unspecified: Secondary | ICD-10-CM

## 2020-06-18 DIAGNOSIS — K219 Gastro-esophageal reflux disease without esophagitis: Secondary | ICD-10-CM

## 2020-06-18 DIAGNOSIS — Z6825 Body mass index (BMI) 25.0-25.9, adult: Secondary | ICD-10-CM

## 2020-06-18 LAB — COMPREHENSIVE METABOLIC PANEL
ALT: 44 U/L (ref 0–53)
AST: 32 U/L (ref 0–37)
Albumin: 4 g/dL (ref 3.5–5.2)
Alkaline Phosphatase: 85 U/L (ref 39–117)
BUN: 15 mg/dL (ref 6–23)
CO2: 29 mEq/L (ref 19–32)
Calcium: 9.3 mg/dL (ref 8.4–10.5)
Chloride: 104 mEq/L (ref 96–112)
Creatinine, Ser: 1.12 mg/dL (ref 0.40–1.50)
GFR: 75.72 mL/min (ref >60.00)
Glucose, Bld: 92 mg/dL (ref 70–99)
Potassium: 4.4 mEq/L (ref 3.5–5.1)
Sodium: 140 mEq/L (ref 135–145)
Total Bilirubin: 0.8 mg/dL (ref 0.2–1.2)
Total Protein: 7 g/dL (ref 6.0–8.3)

## 2020-06-18 LAB — LIPID PANEL
Cholesterol: 182 mg/dL (ref 0–200)
HDL: 41.6 mg/dL (ref >39.00)
LDL Cholesterol: 122 mg/dL — ABNORMAL HIGH (ref 0–99)
NonHDL: 140.65
Total CHOL/HDL Ratio: 4
Triglycerides: 92 mg/dL (ref 0.0–149.0)
VLDL: 18.4 mg/dL (ref 0.0–40.0)

## 2020-06-18 LAB — CBC
HCT: 48.2 % (ref 39.0–52.0)
Hemoglobin: 16.9 g/dL (ref 13.0–17.0)
MCHC: 35 g/dL (ref 30.0–36.0)
MCV: 97 fl (ref 78.0–100.0)
Platelets: 206 10*3/uL (ref 150.0–400.0)
RBC: 4.97 Mil/uL (ref 4.22–5.81)
RDW: 12.7 % (ref 11.5–15.5)
WBC: 6 10*3/uL (ref 4.0–10.5)

## 2020-06-18 LAB — TSH: TSH: 0.84 u[IU]/mL (ref 0.35–4.50)

## 2020-06-18 LAB — HEMOGLOBIN A1C: Hgb A1c MFr Bld: 4.9 % (ref 4.6–6.5)

## 2020-06-18 LAB — PSA: PSA: 1.62 ng/mL (ref 0.10–4.00)

## 2020-06-18 MED ORDER — OMEPRAZOLE 40 MG PO CPDR: DELAYED_RELEASE_CAPSULE | ORAL | 3 refills | Status: DC

## 2020-06-18 MED ORDER — VALACYCLOVIR HCL 1 G PO TABS: 1000.0000 mg | ORAL_TABLET | Freq: Every day | ORAL | 3 refills | Status: DC

## 2020-06-18 NOTE — Assessment & Plan Note (Signed)
Check A1c. 

## 2020-06-18 NOTE — Progress Notes (Signed)
Chief Complaint:  Jacob Buchanan is a 50 y.o. male who presents today for his annual comprehensive physical exam.    Assessment/Plan:  Chronic Problems Addressed Today: Dyslipidemia Check labs.  Hyperglycemia Check A1c.  GERD (gastroesophageal reflux disease) Stable. Omeprazole refilled.   Body mass index is 25.58 kg/m. / Overweight  BMI Metric Follow Up - 06/18/20 1125      BMI Metric Follow Up-Please document annually   BMI Metric Follow Up Education provided           Preventative Healthcare: Check labs.  Tdap given today.  Up-to-date on colon cancer screening.  He will check with insurance regarding shingles vaccine.  Patient Counseling(The following topics were reviewed and/or handout was given):  -Nutrition: Stressed importance of moderation in sodium/caffeine intake, saturated fat and cholesterol, caloric balance, sufficient intake of fresh fruits, vegetables, and fiber.  -Stressed the importance of regular exercise.   -Substance Abuse: Discussed cessation/primary prevention of tobacco, alcohol, or other drug use; driving or other dangerous activities under the influence; availability of treatment for abuse.   -Injury prevention: Discussed safety belts, safety helmets, smoke detector, smoking near bedding or upholstery.   -Sexuality: Discussed sexually transmitted diseases, partner selection, use of condoms, avoidance of unintended pregnancy and contraceptive alternatives.   -Dental health: Discussed importance of regular tooth brushing, flossing, and dental visits.  -Health maintenance and immunizations reviewed. Please refer to Health maintenance section.  Return to care in 1 year for next preventative visit.     Subjective:  HPI:  He has no acute complaints today. See A/p for status of chronic conditions.  Lifestyle Diet: None specific.  Exercise: Limited. Busy with work.   Depression screen PHQ 2/9 06/18/2020  Decreased Interest 0  Down, Depressed,  Hopeless 0  PHQ - 2 Score 0    Health Maintenance Due  Topic Date Due  . Hepatitis C Screening  Never done  . TETANUS/TDAP  09/13/2017     ROS: Per HPI, otherwise a complete review of systems was negative.   PMH:  The following were reviewed and entered/updated in epic: Past Medical History:  Diagnosis Date  . Anemia   . ED (erectile dysfunction)   . Esophageal stricture   . GERD (gastroesophageal reflux disease)   . Gilbert syndrome   . Hyperlipidemia   . Incomplete right bundle branch block   . Partial tear of right rotator cuff    Patient Active Problem List   Diagnosis Date Noted  . Hyperglycemia 05/30/2019  . Recurrent herpes simplex 04/26/2019  . GERD (gastroesophageal reflux disease) 04/19/2017  . Generalized headache 01/13/2015  . Dyslipidemia 09/14/2007  . RHINOCONJUNCTIVITIS, ALLERGIC 09/14/2007  . ASTHMA 09/14/2007   Past Surgical History:  Procedure Laterality Date  . COLONOSCOPY  2004 ; 2007; & 05-29-2012   last one with propofol and hemorroidectomy with sclerosing  . COLONOSCOPY  09/05/2019  . FINGER SURGERY  1994   repair/ reconstruction right index crush injury  . INGUINAL HERNIA REPAIR Bilateral age 3  . SHOULDER ARTHROSCOPY WITH LABRAL REPAIR Right 12/31/2013   Procedure: RIGHT SHOULDER ARTHROSCOPY WITH LABRAL REPAIR ;  Surgeon: Eugenia Mcalpine, MD;  Location: [facility];  Service: Orthopedics;  Laterality: Right;  . UPPER GI ENDOSCOPY  2015   esophageal dilation    Family History  Problem Relation Age of Onset  . Lung cancer Maternal Grandfather   . Other Sister        Diabetes Insipidus  . Rectal cancer Sister   .  Mitral valve prolapse Father   . Ulcers Sister        peptic  . Emphysema Paternal Grandfather   . Lung cancer Paternal Grandfather   . Stroke Neg Hx   . Heart disease Neg Hx   . Colon cancer Neg Hx   . Colon polyps Neg Hx   . Esophageal cancer Neg Hx   . Stomach cancer Neg Hx     Medications- reviewed  and updated Current Outpatient Medications  Medication Sig Dispense Refill  . fluticasone (FLONASE) 50 MCG/ACT nasal spray Place 1 spray into both nostrils daily.    Marland Kitchen omeprazole (PRILOSEC) 40 MG capsule TAKE ONE CAPSULE BY MOUTH TWICE A DAY 180 capsule 3  . valACYclovir (VALTREX) 1000 MG tablet Take 1 tablet (1,000 mg total) by mouth daily. 90 tablet 3   No current facility-administered medications for this visit.    Allergies-reviewed and updated No Known Allergies  Social History   Socioeconomic History  . Marital status: Divorced    Spouse name: Not on file  . Number of children: 0  . Years of education: Not on file  . Highest education level: Not on file  Occupational History  . Occupation: landscaper-self  employed  Tobacco Use  . Smoking status: Never Smoker  . Smokeless tobacco: Never Used  Substance and Sexual Activity  . Alcohol use: Yes    Alcohol/week: 14.0 standard drinks    Types: 14 Standard drinks or equivalent per week    Comment: 2 drinks per day  . Drug use: No  . Sexual activity: Not on file  Other Topics Concern  . Not on file  Social History Narrative  . Not on file   Social Determinants of Health   Financial Resource Strain: Not on file  Food Insecurity: Not on file  Transportation Needs: Not on file  Physical Activity: Not on file  Stress: Not on file  Social Connections: Not on file        Objective:  Physical Exam: BP 131/84   Pulse 77   Temp 98.2 F (36.8 C) (Temporal)   Ht 6\' 2"  (1.88 m)   Wt 199 lb 3.2 oz (90.4 kg)   SpO2 97%   BMI 25.58 kg/m   Body mass index is 25.58 kg/m. Wt Readings from Last 3 Encounters:  06/18/20 199 lb 3.2 oz (90.4 kg)  10/21/19 194 lb (88 kg)  09/05/19 195 lb (88.5 kg)   Gen: NAD, resting comfortably HEENT: TMs normal bilaterally. OP clear. No thyromegaly noted.  CV: RRR with no murmurs appreciated Pulm: NWOB, CTAB with no crackles, wheezes, or rhonchi GI: Normal bowel sounds present. Soft,  Nontender, Nondistended. MSK: no edema, cyanosis, or clubbing noted Skin: warm, dry Neuro: CN2-12 grossly intact. Strength 5/5 in upper and lower extremities. Reflexes symmetric and intact bilaterally.  Psych: Normal affect and thought content     Tajuana Kniskern M. 11/06/19, MD 06/18/2020 11:27 AM

## 2020-06-18 NOTE — Addendum Note (Signed)
Addended by: Dyann Kief on: 06/18/2020 11:55 AM   Modules accepted: Orders

## 2020-06-18 NOTE — Assessment & Plan Note (Signed)
Check labs 

## 2020-06-18 NOTE — Patient Instructions (Signed)
It was very nice to see you today!  We will check lab work today.  We will get your tetanus vaccine today.  Please check with your insurance company regarding the shingles vaccine.  I will see back in a year.  Come back to see me sooner if needed.  Take care, Dr [name]  PLEASE NOTE:  If you had any lab tests please let us know if you have not heard back within a few days. You may see your results on mychart before we have a chance to review them but we will give you a call once they are reviewed by [place]. If we ordered any referrals today, please let us know if you have not heard from their office within the next week.   Please try these tips to maintain a healthy lifestyle:   Eat at least 3 REAL meals and 1-2 snacks per day.  Aim for no more than 5 hours between eating.  If you eat breakfast, please do so within one hour of getting up.    Each meal should contain half fruits/vegetables, one quarter protein, and one quarter carbs (no bigger than a computer mouse)   Cut down on sweet beverages. This includes juice, soda, and sweet tea.     Drink at least 1 glass of water with each meal and aim for at least 8 glasses per day   Exercise at least 150 minutes every week.    Preventive Care 48-58 Years Old, Male Preventive care refers to lifestyle choices and visits with your health care provider that can promote health and wellness. This includes:  A yearly physical exam. This is also called an annual wellness visit.  Regular dental and eye exams.  Immunizations.  Screening for certain conditions.  Healthy lifestyle choices, such as: ? Eating a healthy diet. ? Getting regular exercise. ? Not using drugs or products that contain nicotine and tobacco. ? Limiting alcohol use. What can I expect for my preventive care visit? Physical exam Your health care provider will check your:  Height and weight. These may be used to calculate your BMI (body mass index). BMI is a  measurement that tells if you are at a healthy weight.  Heart rate and blood pressure.  Body temperature.  Skin for abnormal spots. Counseling Your health care provider may ask you questions about your:  Past medical problems.  Family's medical history.  Alcohol, tobacco, and drug use.  Emotional well-being.  Home life and relationship well-being.  Sexual activity.  Diet, exercise, and sleep habits.  Work and work Astronomer.  Access to firearms. What immunizations do I need? Vaccines are usually given at various ages, according to a schedule. Your health care provider will recommend vaccines for you based on your age, medical history, and lifestyle or other factors, such as travel or where you work.   What tests do I need? Blood tests  Lipid and cholesterol levels. These may be checked every 5 years, or more often if you are over 56 years old.  Hepatitis C test.  Hepatitis B test. Screening  Lung cancer screening. You may have this screening every year starting at age 47 if you have a 30-pack-year history of smoking and currently smoke or have quit within the past 15 years.  Prostate cancer screening. Recommendations will vary depending on your family history and other risks.  Genital exam to check for testicular cancer or hernias.  Colorectal cancer screening. ? All adults should have this screening starting at  age 49 and continuing until age 25. ? Your health care provider may recommend screening at age 24 if you are at increased risk. ? You will have tests every 1-10 years, depending on your results and the type of screening test.  Diabetes screening. ? This is done by checking your blood sugar (glucose) after you have not eaten for a while (fasting). ? You may have this done every 1-3 years.  STD (sexually transmitted disease) testing, if you are at risk. Follow these instructions at home: Eating and drinking  Eat a diet that includes fresh fruits and  vegetables, whole grains, lean protein, and low-fat dairy products.  Take vitamin and mineral supplements as recommended by your health care provider.  Do not drink alcohol if your health care provider tells you not to drink.  If you drink alcohol: ? Limit how much you have to 0-2 drinks a day. ? Be aware of how much alcohol is in your drink. In the [place] one drink equals one 12 oz bottle of beer (355 mL), one 5 oz glass of wine (148 mL), or one 1 oz glass of hard liquor (44 mL).   Lifestyle  Take daily care of your teeth and gums. Brush your teeth every morning and night with fluoride toothpaste. Floss one time each day.  Stay active. Exercise for at least 30 minutes 5 or more days each week.  Do not use any products that contain nicotine or tobacco, such as cigarettes, e-cigarettes, and chewing tobacco. If you need help quitting, ask your health care provider.  Do not use drugs.  If you are sexually active, practice safe sex. Use a condom or other form of protection to prevent STIs (sexually transmitted infections).  If told by your health care provider, take low-dose aspirin daily starting at age 57.  Find healthy ways to cope with stress, such as: ? Meditation, yoga, or listening to music. ? Journaling. ? Talking to a trusted person. ? Spending time with friends and family. Safety  Always wear your seat belt while driving or riding in a vehicle.  Do not drive: ? If you have been drinking alcohol. Do not ride with someone who has been drinking. ? When you are tired or distracted. ? While texting.  Wear a helmet and other protective equipment during sports activities.  If you have firearms in your house, make sure you follow all gun safety procedures. What's next?  Go to your health care provider once a year for an annual wellness visit.  Ask your health care provider how often you should have your eyes and teeth checked.  Stay up to date on all vaccines. This  information is not intended to replace advice given to you by your health care provider. Make sure you discuss any questions you have with your health care provider. Document Revised: 11/20/2018 Document Reviewed: 02/15/2018 Elsevier Patient Education  2021 [employer].

## 2020-06-18 NOTE — Assessment & Plan Note (Signed)
Stable. Omeprazole refilled.  

## 2020-06-22 NOTE — Progress Notes (Signed)
Please inform patient of the following:  His labs are all STABLE. Would like for him to keep up the good work and we can recheck in a year.  [name]. [name], MD 06/22/2020 3:46 PM

## 2020-06-23 ENCOUNTER — Other Ambulatory Visit: Payer: Self-pay

## 2020-06-23 ENCOUNTER — Ambulatory Visit (INDEPENDENT_AMBULATORY_CARE_PROVIDER_SITE_OTHER): Payer: BC Managed Care – PPO

## 2020-06-23 DIAGNOSIS — Z23 Encounter for immunization: Secondary | ICD-10-CM | POA: Diagnosis not present

## 2020-09-24 ENCOUNTER — Other Ambulatory Visit: Payer: Self-pay

## 2020-09-24 ENCOUNTER — Encounter: Payer: Self-pay | Admitting: *Deleted

## 2020-09-24 ENCOUNTER — Ambulatory Visit (INDEPENDENT_AMBULATORY_CARE_PROVIDER_SITE_OTHER): Payer: BC Managed Care – PPO | Admitting: *Deleted

## 2020-09-24 DIAGNOSIS — Z23 Encounter for immunization: Secondary | ICD-10-CM | POA: Diagnosis not present

## 2020-09-24 NOTE — Progress Notes (Signed)
Per orders of Alyssa Allwardt, PA-C, injection of Shingrix 0.5 ml given IM by Corky Mull, LPN in right deltoid. Patient tolerated injection well. Patient has completed the series.

## 2020-12-24 ENCOUNTER — Telehealth (INDEPENDENT_AMBULATORY_CARE_PROVIDER_SITE_OTHER): Payer: BC Managed Care – PPO | Admitting: Family Medicine

## 2020-12-24 VITALS — Ht 74.0 in | Wt 195.0 lb

## 2020-12-24 DIAGNOSIS — J309 Allergic rhinitis, unspecified: Secondary | ICD-10-CM | POA: Diagnosis not present

## 2020-12-24 DIAGNOSIS — R059 Cough, unspecified: Secondary | ICD-10-CM | POA: Diagnosis not present

## 2020-12-24 DIAGNOSIS — J45909 Unspecified asthma, uncomplicated: Secondary | ICD-10-CM

## 2020-12-24 MED ORDER — PREDNISONE 50 MG PO TABS: ORAL_TABLET | ORAL | 0 refills | Status: DC

## 2020-12-24 MED ORDER — AZELASTINE HCL 0.1 % NA SOLN: 2.0000 | Freq: Two times a day (BID) | NASAL | 12 refills | Status: DC

## 2020-12-24 MED ORDER — AZITHROMYCIN 250 MG PO TABS: ORAL_TABLET | ORAL | 0 refills | Status: DC

## 2020-12-24 NOTE — Assessment & Plan Note (Signed)
Mild flare.  Will start Astelin nasal spray.  He can continue over-the-counter meds as well.

## 2020-12-24 NOTE — Assessment & Plan Note (Signed)
Mild flare to URI.  Discussed prednisone however patient deferred for now.  He will let me know if not improving.

## 2020-12-24 NOTE — Progress Notes (Signed)
   Jacob Buchanan is a 50 y.o. male who presents today for a virtual office visit.  Assessment/Plan:  New/Acute Problems: Sinusitis Will start Astelin nasal spray.  Sent in pocket prescription for azithromycin with instruction not start unless symptoms did not improve over the next few days.  Continue good oral hydration.  He will let me know if not improving.  Chronic Problems Addressed Today: Asthma Mild flare to URI.  Discussed prednisone however patient deferred for now.  He will let me know if not improving.  Allergic rhinitis Mild flare.  Will start Astelin nasal spray.  He can continue over-the-counter meds as well.     Subjective:  HPI:  Symptoms started about week ago. A lot of sinus pressure and congestion. Netti pot helps. Tried seasonal allergy medications. No fevers or chills. Some wheezing.       Objective/Observations  Physical Exam: Gen: NAD, resting comfortably Pulm: Normal work of breathing Neuro: Grossly normal, moves all extremities Psych: Normal affect and thought content  Virtual Visit via Video   I connected with Jacob Buchanan on 12/24/20 at  4:00 PM EDT by a video enabled telemedicine application and verified that I am speaking with the correct person using two identifiers. The limitations of evaluation and management by telemedicine and the availability of in person appointments were discussed. The patient expressed understanding and agreed to proceed.   Patient location: Home Provider location: [place] [employer] Persons participating in the virtual visit: Myself and Patient     [name]. [name], MD 12/24/2020 4:26 PM

## 2021-03-09 ENCOUNTER — Other Ambulatory Visit: Payer: Self-pay

## 2021-03-09 ENCOUNTER — Encounter: Payer: Self-pay | Admitting: Family Medicine

## 2021-03-09 ENCOUNTER — Ambulatory Visit (INDEPENDENT_AMBULATORY_CARE_PROVIDER_SITE_OTHER): Payer: BC Managed Care – PPO | Admitting: Family Medicine

## 2021-03-09 VITALS — BP 122/80 | HR 84 | Temp 97.7°F | Ht 73.0 in | Wt 208.6 lb

## 2021-03-09 DIAGNOSIS — L430 Hypertrophic lichen planus: Secondary | ICD-10-CM | POA: Diagnosis not present

## 2021-03-09 DIAGNOSIS — Q559 Congenital malformation of male genital organ, unspecified: Secondary | ICD-10-CM

## 2021-03-09 DIAGNOSIS — D225 Melanocytic nevi of trunk: Secondary | ICD-10-CM | POA: Diagnosis not present

## 2021-03-09 DIAGNOSIS — Z1283 Encounter for screening for malignant neoplasm of skin: Secondary | ICD-10-CM | POA: Diagnosis not present

## 2021-03-09 DIAGNOSIS — L818 Other specified disorders of pigmentation: Secondary | ICD-10-CM | POA: Diagnosis not present

## 2021-03-09 DIAGNOSIS — Z125 Encounter for screening for malignant neoplasm of prostate: Secondary | ICD-10-CM

## 2021-03-09 DIAGNOSIS — K219 Gastro-esophageal reflux disease without esophagitis: Secondary | ICD-10-CM

## 2021-03-09 DIAGNOSIS — E785 Hyperlipidemia, unspecified: Secondary | ICD-10-CM | POA: Diagnosis not present

## 2021-03-09 DIAGNOSIS — I872 Venous insufficiency (chronic) (peripheral): Secondary | ICD-10-CM | POA: Diagnosis not present

## 2021-03-09 LAB — PSA: PSA: 1.11 ng/mL (ref 0.10–4.00)

## 2021-03-09 NOTE — Assessment & Plan Note (Signed)
He will come back for his physical in a few months we can recheck lipids at that point.

## 2021-03-09 NOTE — Assessment & Plan Note (Signed)
Stable  On omeprazole.

## 2021-03-09 NOTE — Progress Notes (Signed)
° °  Jacob Buchanan is a 51 y.o. male who presents today for an office visit.  Assessment/Plan:  New/Acute Problems: Testicular asymmetry Likely hydrocele.  Will check ultrasound to further evaluate  Chronic Problems Addressed Today: Dyslipidemia He will come back for his physical in a few months we can recheck lipids at that point.  GERD (gastroesophageal reflux disease) Stable  On omeprazole.  Preventative health care Discussed routine screenings.  We will check CT calcium score.  Also check PSA and urinalysis.  He will come back soon for annual physical and we will check cholesterol on labs at that time.  Up-to-date on colon cancer screening.    Subjective:  HPI:  He has noticed testicular enlargement recently. Have some pressure in the area. Denies lumps or bumps. No recent changes. No trauma.  He thinks it has been like this for a few years. Does have a history of vasectomy.  He does not member if the swelling is present before or after.  He would like get age appropriate cancer screening done. Denies fever, hematuria, nausea,or  vomiting.   He had issue with heart murmur. He would like to schedule for CT scan. Denies hx of  heart attack or stroke.        Objective:  Physical Exam: BP 122/80    Pulse 84    Temp 97.7 F (36.5 C) (Temporal)    Ht 6\' 1"  (1.854 m)    Wt 208 lb 9.6 oz (94.6 kg)    SpO2 96%    BMI 27.52 kg/m   Gen: No acute distress, resting comfortably CV: Regular rate and rhythm with no murmurs appreciated Pulm: Normal work of breathing, clear to auscultation bilaterally with no crackles, wheezes, or rhonchi GU: Left testicular enlargement noted.  Nontender to palpation. Neuro: Grossly normal, moves all extremities Psych: Normal affect and thought content       I,Savera Zaman,acting as a scribe for Dimas Chyle, MD.,have documented all relevant documentation on the behalf of Dimas Chyle, MD,as directed by  Dimas Chyle, MD while in the presence of  Dimas Chyle, MD.   I, Dimas Chyle, MD, have reviewed all documentation for this visit. The documentation on 03/09/21 for the exam, diagnosis, procedures, and orders are all accurate and complete.  Algis Greenhouse. Jerline Pain, MD 03/09/2021 2:08 PM

## 2021-03-09 NOTE — Patient Instructions (Signed)
It was very nice to see you today!  We will check a PSA today and a urine sample.  We will order an ultrasound.  You probably have a hydrocele.  We will also order a cardiac CT scan.  We will see you back in a few months for your annual physical.  Come back sooner if needed.  Take care, Dr [name]  PLEASE NOTE:  If you had any lab tests please let us know if you have not heard back within a few days. You may see your results on mychart before we have a chance to review them but we will give you a call once they are reviewed by [place]. If we ordered any referrals today, please let us know if you have not heard from their office within the next week.   Please try these tips to maintain a healthy lifestyle:  Eat at least 3 REAL meals and 1-2 snacks per day.  Aim for no more than 5 hours between eating.  If you eat breakfast, please do so within one hour of getting up.   Each meal should contain half fruits/vegetables, one quarter protein, and one quarter carbs (no bigger than a computer mouse)  Cut down on sweet beverages. This includes juice, soda, and sweet tea.   Drink at least 1 glass of water with each meal and aim for at least 8 glasses per day  Exercise at least 150 minutes every week.

## 2021-03-10 ENCOUNTER — Telehealth: Payer: Self-pay

## 2021-03-10 LAB — URINALYSIS, ROUTINE W REFLEX MICROSCOPIC
Bilirubin Urine: NEGATIVE
Hgb urine dipstick: NEGATIVE
Leukocytes,Ua: NEGATIVE
Nitrite: NEGATIVE
RBC / HPF: NONE SEEN (ref 0–?)
Specific Gravity, Urine: >=1.03 — AB (ref 1.000–1.030)
Total Protein, Urine: NEGATIVE
Urine Glucose: NEGATIVE
Urobilinogen, UA: 0.2 (ref 0.0–1.0)
pH: 5.5 (ref 5.0–8.0)

## 2021-03-10 NOTE — Progress Notes (Signed)
Please inform patient of the following:  Urine test shows is is slightly dehydrated but it is otherwise normal. His PSA is normal also. Do not need to do any further testing at this point.  Jacob Buchanan. [name], MD 03/10/2021 7:45 AM

## 2021-03-10 NOTE — Telephone Encounter (Signed)
[facility] has called wanting to know if US Scrotum is to be completed with or without doppler.

## 2021-03-12 NOTE — Telephone Encounter (Signed)
Without doppler.  Algis Greenhouse. Jerline Pain, MD 03/12/2021 4:05 PM

## 2021-03-12 NOTE — Telephone Encounter (Signed)
See note

## 2021-03-15 ENCOUNTER — Ambulatory Visit
Admission: RE | Admit: 2021-03-15 | Discharge: 2021-03-15 | Disposition: A | Discharge status: Home / Self Care | Payer: BC Managed Care – PPO | Source: Ambulatory Visit | Attending: Family Medicine | Admitting: Family Medicine

## 2021-03-15 DIAGNOSIS — N442 Benign cyst of testis: Secondary | ICD-10-CM | POA: Diagnosis not present

## 2021-03-15 DIAGNOSIS — Q559 Congenital malformation of male genital organ, unspecified: Secondary | ICD-10-CM

## 2021-03-15 DIAGNOSIS — N5089 Other specified disorders of the male genital organs: Secondary | ICD-10-CM | POA: Diagnosis not present

## 2021-03-15 NOTE — Telephone Encounter (Signed)
[facility] notified  us without doppler

## 2021-03-16 ENCOUNTER — Other Ambulatory Visit: Payer: Self-pay | Admitting: *Deleted

## 2021-03-16 ENCOUNTER — Telehealth: Payer: Self-pay | Admitting: Family Medicine

## 2021-03-16 DIAGNOSIS — N442 Benign cyst of testis: Secondary | ICD-10-CM

## 2021-03-16 NOTE — Telephone Encounter (Signed)
Pt called back about [place] results but the the nurse was at lunch. I let him know we would call him back

## 2021-03-16 NOTE — Telephone Encounter (Signed)
See results note. 

## 2021-03-16 NOTE — Progress Notes (Signed)
Please inform patient of the following:  His ultrasound showed a benign cyst. This should not cause any major issues however recommend referral to urology to discuss treatment options.

## 2021-03-19 ENCOUNTER — Telehealth (INDEPENDENT_AMBULATORY_CARE_PROVIDER_SITE_OTHER): Payer: BC Managed Care – PPO | Admitting: Family Medicine

## 2021-03-19 ENCOUNTER — Encounter: Payer: Self-pay | Admitting: Family Medicine

## 2021-03-19 DIAGNOSIS — J309 Allergic rhinitis, unspecified: Secondary | ICD-10-CM | POA: Diagnosis not present

## 2021-03-19 DIAGNOSIS — N442 Benign cyst of testis: Secondary | ICD-10-CM | POA: Diagnosis not present

## 2021-03-19 MED ORDER — PREDNISONE 50 MG PO TABS: ORAL_TABLET | ORAL | 0 refills | Status: DC

## 2021-03-19 MED ORDER — AZITHROMYCIN 250 MG PO TABS: ORAL_TABLET | ORAL | 0 refills | Status: DC

## 2021-03-19 MED ORDER — AZELASTINE HCL 0.1 % NA SOLN: 2.0000 | Freq: Two times a day (BID) | NASAL | 12 refills | Status: DC

## 2021-03-19 NOTE — Assessment & Plan Note (Signed)
We reviewed his recent ultrasound.  Showed benign cyst.  Not having any significant symptoms at this point.  He would like to continue with watchful waiting.  Discussed reasons to return to care.

## 2021-03-19 NOTE — Assessment & Plan Note (Signed)
Mild flare.  Will refill Astelin.  He can continue his over-the-counter medications as well.

## 2021-03-19 NOTE — Progress Notes (Signed)
° °  Jacob Buchanan is a 51 y.o. male who presents today for a virtual office visit.  Assessment/Plan:  New/Acute Problems: Sinusitis No red flags.  Given length of symptoms we will start prednisone and azithromycin.  This worked well for him a few months ago.  Also start Astelin nasal spray.  Encouraged hydration.  Can continue over-the-counter meds.  Discussed reasons return to care  Chronic Problems Addressed Today: Allergic rhinitis Mild flare.  Will refill Astelin.  He can continue his over-the-counter medications as well.  Testicular cyst We reviewed his recent ultrasound.  Showed benign cyst.  Not having any significant symptoms at this point.  He would like to continue with watchful waiting.  Discussed reasons to return to care.     Subjective:  HPI:  Patient here with sinus congestion and cough. Started a couple of weeks ago. Feels like previous sinus infection. No fevers.  He has done well with azithromycin and prednisone in the past.  No other specific treatments tried.  We saw him a couple weeks ago for testicular asymmetry.  Ended up getting an ultrasound.  This showed testicular cyst.  No other concerning results.       Objective/Observations  Physical Exam: Gen: NAD, resting comfortably Pulm: Normal work of breathing Neuro: Grossly normal, moves all extremities Psych: Normal affect and thought content  Virtual Visit via Video   I connected with Jacob Buchanan on 03/19/21 at  4:00 PM EST by a video enabled telemedicine application and verified that I am speaking with the correct person using two identifiers. The limitations of evaluation and management by telemedicine and the availability of in person appointments were discussed. The patient expressed understanding and agreed to proceed.   Patient location: Home Provider location: [place] [employer] Persons participating in the virtual visit: Myself and Patient     [name]. [name],  MD 03/19/2021 2:31 PM

## 2021-04-05 ENCOUNTER — Other Ambulatory Visit: Payer: Self-pay | Admitting: Family Medicine

## 2021-04-05 ENCOUNTER — Ambulatory Visit
Admission: RE | Admit: 2021-04-05 | Discharge: 2021-04-05 | Disposition: A | Discharge status: Home / Self Care | Payer: No Typology Code available for payment source | Source: Ambulatory Visit | Attending: Family Medicine | Admitting: Family Medicine

## 2021-04-05 DIAGNOSIS — I2510 Atherosclerotic heart disease of native coronary artery without angina pectoris: Secondary | ICD-10-CM

## 2021-04-06 NOTE — Progress Notes (Signed)
Please inform patient of the following:  Good news! His calcium score is 0. This means he has no calcifications or build up in the arteries in his heart. We can recheck again in 5-10 years.  [name]. [name], MD 04/06/2021 8:00 AM

## 2021-04-27 DIAGNOSIS — L430 Hypertrophic lichen planus: Secondary | ICD-10-CM | POA: Diagnosis not present

## 2021-04-27 DIAGNOSIS — B078 Other viral warts: Secondary | ICD-10-CM | POA: Diagnosis not present

## 2021-04-27 DIAGNOSIS — D2372 Other benign neoplasm of skin of left lower limb, including hip: Secondary | ICD-10-CM | POA: Diagnosis not present

## 2021-05-05 ENCOUNTER — Telehealth: Payer: Self-pay | Admitting: Family Medicine

## 2021-05-05 NOTE — Telephone Encounter (Signed)
See note and advise °

## 2021-05-05 NOTE — Telephone Encounter (Signed)
Pt states he was previously prescribed Cialis and Viagra. He is wanting to have these ordered again. He is also wanting to have the scripts emailed to him. Please Advise ?

## 2021-05-06 ENCOUNTER — Telehealth: Payer: Self-pay | Admitting: Family Medicine

## 2021-05-06 NOTE — Telephone Encounter (Signed)
LVM for pt to return my call to advise Rx information ?

## 2021-05-06 NOTE — Telephone Encounter (Signed)
I am happy to fill them but I do not think we can email the prescriptions. I can send them in to his pharmacy or we can print out a paper copy if he prefers that. ? ?[name]. [name], MD ?05/06/2021 8:10 AM  ? ?

## 2021-05-06 NOTE — Telephone Encounter (Signed)
Pt verified DOB, advised Printing or sending over Rx's electronically. Pt asked to print those and he would pick them up. Advised I would let him know when they were ready. ?

## 2021-05-06 NOTE — Telephone Encounter (Signed)
Looking through his chart it does not look like we have prescribed either of those in the past. Last I see was from 2013 by a different provider. ? ?Can we have him schedule an appointment to discuss? ? ?[name]. [name], MD ?05/06/2021 3:36 PM  ? ?

## 2021-05-06 NOTE — Telephone Encounter (Signed)
Patient called back in regards to prescription.  ?

## 2021-05-06 NOTE — Telephone Encounter (Signed)
Patient scheduled for an appt

## 2021-05-06 NOTE — Telephone Encounter (Signed)
See other patient call note 

## 2021-05-07 ENCOUNTER — Encounter: Payer: Self-pay | Admitting: Family Medicine

## 2021-05-07 ENCOUNTER — Other Ambulatory Visit: Payer: Self-pay

## 2021-05-07 ENCOUNTER — Ambulatory Visit (INDEPENDENT_AMBULATORY_CARE_PROVIDER_SITE_OTHER): Payer: BC Managed Care – PPO | Admitting: Family Medicine

## 2021-05-07 DIAGNOSIS — R739 Hyperglycemia, unspecified: Secondary | ICD-10-CM | POA: Diagnosis not present

## 2021-05-07 DIAGNOSIS — N529 Male erectile dysfunction, unspecified: Secondary | ICD-10-CM

## 2021-05-07 DIAGNOSIS — E785 Hyperlipidemia, unspecified: Secondary | ICD-10-CM

## 2021-05-07 MED ORDER — TADALAFIL 20 MG PO TABS
10.0000 mg | ORAL_TABLET | Freq: Every day | ORAL | 3 refills | Status: DC | PRN
Start: 2021-05-07 — End: 2022-02-28

## 2021-05-07 MED ORDER — SILDENAFIL CITRATE 100 MG PO TABS: 50.0000 mg | ORAL_TABLET | Freq: Every day | ORAL | 3 refills | Status: DC | PRN

## 2021-05-07 NOTE — Progress Notes (Signed)
? ?  Jacob Buchanan is a 51 y.o. male who presents today for an office visit. ? ?Assessment/Plan:  ?New/Acute Problems: ?Elevated blood pressure ?Slightly above goal.  Typically well controlled. Will continue home monitoring goal 140/90 or lower.  He will let me know if persistently elevated.  Follow-up again in few months for CPE. ? ?Chronic Problems Addressed Today: ?Erectile dysfunction ?We will refill his Cialis and Viagra.  He is aware to not take both of these at the same time.  He has been alternating these as needed which seems to work well.  He will let me know if he runs in any issues or develops any side effects. ? ?He will need referral to urology if continues to have issues despite medications. ? ?Dyslipidemia ?He will come back soon for physical in a few months and we can recheck lipids at that point. ? ?Hyperglycemia ?Recheck A1c when he comes in for CPE. ? ? ?  ?Subjective:  ?HPI: ? ?See A/P for status chronic conditions.  He has no acute concerns today. ? ?He requests refill on Viagra and Cialis.  He has a longstanding history of erectile dysfunction.  He has been alternating between these 2 medications for several years.  Tolerates well without any significant side effects.  He will usually take about 50 mg of Viagra as needed or 10 mg of Cialis as needed.  Medications work well. ? ?   ?  ?Objective:  ?Physical Exam: ?BP (!) 142/94   Pulse 69   Temp 97.6 ?F (36.4 ?C) (Temporal)   Ht 6\' 1"  (1.854 m)   Wt 214 lb 3.2 oz (97.2 kg)   SpO2 96%   BMI 28.26 kg/m?   ?Gen: No acute distress, resting comfortably ?CV: Regular rate and rhythm with no murmurs appreciated ?Pulm: Normal work of breathing, clear to auscultation bilaterally with no crackles, wheezes, or rhonchi ?Neuro: Grossly normal, moves all extremities ?Psych: Normal affect and thought content ? ?   ? ? . [name], MD ?05/07/2021 11:39 AM  ?

## 2021-05-07 NOTE — Assessment & Plan Note (Signed)
We will refill his Cialis and Viagra.  He is aware to not take both of these at the same time.  He has been alternating these as needed which seems to work well.  He will let me know if he runs in any issues or develops any side effects. ? ?He will need referral to urology if continues to have issues despite medications. ?

## 2021-05-07 NOTE — Assessment & Plan Note (Signed)
He will come back soon for physical in a few months and we can recheck lipids at that point. ?

## 2021-05-07 NOTE — Patient Instructions (Signed)
It was very nice to see you today! ? ?I will refill your medications today. ? ?Please keep an eye on your blood pressure and let me know persistently elevated. ? ?I will see you back in a few months for your annual checkup with labs.  Please come back sooner if needed. ? ?Take care, ?Dr Jerline Pain ? ?PLEASE NOTE: ? ?If you had any lab tests please let us know if you have not heard back within a few days. You may see your results on mychart before we have a chance to review them but we will give you a call once they are reviewed by [place]. If we ordered any referrals today, please let us know if you have not heard from their office within the next week.  ? ?Please try these tips to maintain a healthy lifestyle: ? ?Eat at least 3 REAL meals and 1-2 snacks per day.  Aim for no more than 5 hours between eating.  If you eat breakfast, please do so within one hour of getting up.  ? ?Each meal should contain half fruits/vegetables, one quarter protein, and one quarter carbs (no bigger than a computer mouse) ? ?Cut down on sweet beverages. This includes juice, soda, and sweet tea.  ? ?Drink at least 1 glass of water with each meal and aim for at least 8 glasses per day ? ?Exercise at least 150 minutes every week.   ?

## 2021-05-07 NOTE — Assessment & Plan Note (Signed)
Recheck A1c when he comes in for CPE. ?

## 2021-07-02 ENCOUNTER — Other Ambulatory Visit: Payer: Self-pay | Admitting: Family Medicine

## 2021-08-09 ENCOUNTER — Other Ambulatory Visit: Payer: Self-pay | Admitting: Family Medicine

## 2021-10-14 ENCOUNTER — Encounter: Payer: BC Managed Care – PPO | Admitting: Family Medicine

## 2021-10-19 ENCOUNTER — Encounter: Payer: BC Managed Care – PPO | Admitting: Family Medicine

## 2021-10-26 ENCOUNTER — Encounter: Payer: Self-pay | Admitting: Family Medicine

## 2021-10-26 ENCOUNTER — Ambulatory Visit (INDEPENDENT_AMBULATORY_CARE_PROVIDER_SITE_OTHER): Payer: BC Managed Care – PPO | Admitting: Family Medicine

## 2021-10-26 VITALS — BP 137/88 | HR 69 | Temp 97.9°F | Ht 73.0 in | Wt 205.0 lb

## 2021-10-26 DIAGNOSIS — R739 Hyperglycemia, unspecified: Secondary | ICD-10-CM

## 2021-10-26 DIAGNOSIS — K219 Gastro-esophageal reflux disease without esophagitis: Secondary | ICD-10-CM

## 2021-10-26 DIAGNOSIS — E785 Hyperlipidemia, unspecified: Secondary | ICD-10-CM | POA: Diagnosis not present

## 2021-10-26 DIAGNOSIS — N529 Male erectile dysfunction, unspecified: Secondary | ICD-10-CM

## 2021-10-26 DIAGNOSIS — B009 Herpesviral infection, unspecified: Secondary | ICD-10-CM

## 2021-10-26 DIAGNOSIS — Z0001 Encounter for general adult medical examination with abnormal findings: Secondary | ICD-10-CM

## 2021-10-26 DIAGNOSIS — Z1159 Encounter for screening for other viral diseases: Secondary | ICD-10-CM

## 2021-10-26 DIAGNOSIS — R5382 Chronic fatigue, unspecified: Secondary | ICD-10-CM | POA: Diagnosis not present

## 2021-10-26 LAB — CBC
HCT: 46.8 % (ref 39.0–52.0)
Hemoglobin: 16.4 g/dL (ref 13.0–17.0)
MCHC: 35.1 g/dL (ref 30.0–36.0)
MCV: 98.4 fl (ref 78.0–100.0)
Platelets: 180 10*3/uL (ref 150.0–400.0)
RBC: 4.76 Mil/uL (ref 4.22–5.81)
RDW: 12.8 % (ref 11.5–15.5)
WBC: 5.5 10*3/uL (ref 4.0–10.5)

## 2021-10-26 LAB — COMPREHENSIVE METABOLIC PANEL
ALT: 41 U/L (ref 0–53)
AST: 36 U/L (ref 0–37)
Albumin: 4.2 g/dL (ref 3.5–5.2)
Alkaline Phosphatase: 87 U/L (ref 39–117)
BUN: 14 mg/dL (ref 6–23)
CO2: 29 mEq/L (ref 19–32)
Calcium: 9.3 mg/dL (ref 8.4–10.5)
Chloride: 103 mEq/L (ref 96–112)
Creatinine, Ser: 1.08 mg/dL (ref 0.40–1.50)
GFR: 78.35 mL/min (ref >60.00)
Glucose, Bld: 91 mg/dL (ref 70–99)
Potassium: 4.4 mEq/L (ref 3.5–5.1)
Sodium: 139 mEq/L (ref 135–145)
Total Bilirubin: 0.8 mg/dL (ref 0.2–1.2)
Total Protein: 7.2 g/dL (ref 6.0–8.3)

## 2021-10-26 LAB — HEMOGLOBIN A1C: Hgb A1c MFr Bld: 5.1 % (ref 4.6–6.5)

## 2021-10-26 LAB — LIPID PANEL
Cholesterol: 199 mg/dL (ref 0–200)
HDL: 37.8 mg/dL — ABNORMAL LOW (ref >39.00)
NonHDL: 161.36
Total CHOL/HDL Ratio: 5
Triglycerides: 309 mg/dL — ABNORMAL HIGH (ref 0.0–149.0)
VLDL: 61.8 mg/dL — ABNORMAL HIGH (ref 0.0–40.0)

## 2021-10-26 LAB — TSH: TSH: 1.33 u[IU]/mL (ref 0.35–5.50)

## 2021-10-26 LAB — LDL CHOLESTEROL, DIRECT: Direct LDL: 89 mg/dL

## 2021-10-26 NOTE — Progress Notes (Signed)
Chief Complaint:  Jacob Buchanan is a 51 y.o. male who presents today for his annual comprehensive physical exam.    Assessment/Plan:  New/Acute Problems: Left Knee Mechanical Symptoms  Concern for possible mild meniscal tear based on his symptoms.  Symptoms are not currently bothersome and he has not had any issues for the past few weeks.  No pain.  We discussed watchful waiting versus referral to orthopedic surgery.  We will continue with watchful waiting for now.  We discussed reasons to return to care.  We discussed home exercise program and handout was given.  He will let me know if symptoms worsen and we can refer to orthopedics.  Chronic Problems Addressed Today: Dyslipidemia Check lipids.  Erectile dysfunction Stable on alternating Cialis and Viagra as needed.  Hyperglycemia Check A1c.  Recurrent herpes simplex Doing well.  No recent flares.  He will continue Valtrex 1000 mg daily for suppressive therapy.   GERD (gastroesophageal reflux disease) Stable on omeprazole 40 mg daily.   Preventative Healthcare: Check labs.  Up-to-date on colon cancer screening.  Patient Counseling(The following topics were reviewed and/or handout was given):  -Nutrition: Stressed importance of moderation in sodium/caffeine intake, saturated fat and cholesterol, caloric balance, sufficient intake of fresh fruits, vegetables, and fiber.  -Stressed the importance of regular exercise.   -Substance Abuse: Discussed cessation/primary prevention of tobacco, alcohol, or other drug use; driving or other dangerous activities under the influence; availability of treatment for abuse.   -Injury prevention: Discussed safety belts, safety helmets, smoke detector, smoking near bedding or upholstery.   -Sexuality: Discussed sexually transmitted diseases, partner selection, use of condoms, avoidance of unintended pregnancy and contraceptive alternatives.   -Dental health: Discussed importance of regular  tooth brushing, flossing, and dental visits.  -Health maintenance and immunizations reviewed. Please refer to Health maintenance section.  Return to care in 1 year for next preventative visit.     Subjective:  HPI:  He has no acute complaints today.   Sometimes feels a catch in his left knee. This has been going on for over a year. Recently it seems to be happening more frequently. No pain or swelling.Has noticed some popping. No previous injuries. Sometimes feels it could give out.   Lifestyle Diet: None specific.  Exercise: None specific.      10/26/2021    9:16 AM  Depression screen PHQ 2/9  Decreased Interest 0  Down, Depressed, Hopeless 0  PHQ - 2 Score 0   ROS: Per HPI, otherwise a complete review of systems was negative.   PMH:  The following were reviewed and entered/updated in epic: Past Medical History:  Diagnosis Date   Anemia    ED (erectile dysfunction)    Esophageal stricture    GERD (gastroesophageal reflux disease)    Sullivan Lone syndrome    Hyperlipidemia    Incomplete right bundle branch block    Partial tear of right rotator cuff    Patient Active Problem List   Diagnosis Date Noted   Erectile dysfunction 05/07/2021   Testicular cyst 03/19/2021   Hyperglycemia 05/30/2019   Recurrent herpes simplex 04/26/2019   GERD (gastroesophageal reflux disease) 04/19/2017   Generalized headache 01/13/2015   Dyslipidemia 09/14/2007   Allergic rhinitis 09/14/2007   Asthma 09/14/2007   Past Surgical History:  Procedure Laterality Date   COLONOSCOPY  2004 ; 2007; & 05-29-2012   last one with propofol and hemorroidectomy with sclerosing   COLONOSCOPY  09/05/2019   FINGER SURGERY  1994   repair/  reconstruction right index crush injury   INGUINAL HERNIA REPAIR Bilateral age 76   SHOULDER ARTHROSCOPY WITH LABRAL REPAIR Right 12/31/2013   Procedure: RIGHT SHOULDER ARTHROSCOPY WITH LABRAL REPAIR ;  Surgeon: Sydnee Cabal, MD;  Location: [facility];   Service: Orthopedics;  Laterality: Right;   UPPER GI ENDOSCOPY  2015   esophageal dilation    Family History  Problem Relation Age of Onset   Lung cancer Maternal Grandfather    Other Sister        Diabetes Insipidus   Rectal cancer Sister    Mitral valve prolapse Father    Ulcers Sister        peptic   Emphysema Paternal Grandfather    Lung cancer Paternal Grandfather    Stroke Neg Hx    Heart disease Neg Hx    Colon cancer Neg Hx    Colon polyps Neg Hx    Esophageal cancer Neg Hx    Stomach cancer Neg Hx     Medications- reviewed and updated Current Outpatient Medications  Medication Sig Dispense Refill   azelastine (ASTELIN) 0.1 % nasal spray Place 2 sprays into both nostrils 2 (two) times daily. 30 mL 12   fluticasone (FLONASE) 50 MCG/ACT nasal spray Place 1 spray into both nostrils daily.     omeprazole (PRILOSEC) 40 MG capsule TAKE ONE CAPSULE BY MOUTH TWICE A DAY 180 capsule 1   sildenafil (VIAGRA) 100 MG tablet Take 0.5-1 tablets (50-100 mg total) by mouth daily as needed for erectile dysfunction. 90 tablet 3   tadalafil (CIALIS) 20 MG tablet Take 0.5-1 tablets (10-20 mg total) by mouth daily as needed for erectile dysfunction. 90 tablet 3   valACYclovir (VALTREX) 1000 MG tablet TAKE ONE TABLET BY MOUTH DAILY 90 tablet 0   No current facility-administered medications for this visit.    Allergies-reviewed and updated No Known Allergies  Social History   Socioeconomic History   Marital status: Divorced    Spouse name: Not on file   Number of children: 0   Years of education: Not on file   Highest education level: Not on file  Occupational History   Occupation: landscaper-self  employed  Tobacco Use   Smoking status: Never   Smokeless tobacco: Never  Substance and Sexual Activity   Alcohol use: Yes    Alcohol/week: 14.0 standard drinks of alcohol    Types: 14 Standard drinks or equivalent per week    Comment: 2 drinks per day   Drug use: No   Sexual  activity: Not on file  Other Topics Concern   Not on file  Social History Narrative   Not on file   Social Determinants of Health   Financial Resource Strain: Not on file  Food Insecurity: Not on file  Transportation Needs: Not on file  Physical Activity: Not on file  Stress: Not on file  Social Connections: Not on file        Objective:  Physical Exam: BP 137/88   Pulse 69   Temp 97.9 F (36.6 C) (Temporal)   Ht 6\' 1"  (1.854 m)   Wt 205 lb (93 kg)   SpO2 96%   BMI 27.05 kg/m   Body mass index is 27.05 kg/m. Wt Readings from Last 3 Encounters:  10/26/21 205 lb (93 kg)  05/07/21 214 lb 3.2 oz (97.2 kg)  03/09/21 208 lb 9.6 oz (94.6 kg)   Gen: NAD, resting comfortably HEENT: TMs normal bilaterally. OP clear. No thyromegaly noted.  CV: RRR with no murmurs appreciated Pulm: NWOB, CTAB with no crackles, wheezes, or rhonchi GI: Normal bowel sounds present. Soft, Nontender, Nondistended. MSK: no edema, cyanosis, or clubbing noted - Left Knee: No obvious deformities.  Crepitus and popping noted with passive and active range of motion.  No joint line tenderness.  Negative drawer signs.  Stable to varus and valgus stress. Skin: warm, dry Neuro: CN2-12 grossly intact. Strength 5/5 in upper and lower extremities. Reflexes symmetric and intact bilaterally.  Psych: Normal affect and thought content     Adileny Delon M. [name], MD 10/26/2021 9:35 AM

## 2021-10-26 NOTE — Assessment & Plan Note (Signed)
Check A1c. 

## 2021-10-26 NOTE — Assessment & Plan Note (Signed)
Check lipids 

## 2021-10-26 NOTE — Patient Instructions (Signed)
It was very nice to see you today!  We will check blood work today.  I think you may have a meniscal tear in your knee.  Please work on the exercises.  Please let us know if your symptoms become more problematic and we can refer you to see orthopedics.  We will see you back in a year for your next physical.  Please come back sooner if needed.  Take care, Dr [name]  PLEASE NOTE:  If you had any lab tests please let us know if you have not heard back within a few days. You may see your results on mychart before we have a chance to review them but we will give you a call once they are reviewed by [place]. If we ordered any referrals today, please let us know if you have not heard from their office within the next week.   Please try these tips to maintain a healthy lifestyle:  Eat at least 3 REAL meals and 1-2 snacks per day.  Aim for no more than 5 hours between eating.  If you eat breakfast, please do so within one hour of getting up.   Each meal should contain half fruits/vegetables, one quarter protein, and one quarter carbs (no bigger than a computer mouse)  Cut down on sweet beverages. This includes juice, soda, and sweet tea.   Drink at least 1 glass of water with each meal and aim for at least 8 glasses per day  Exercise at least 150 minutes every week.    Preventive Care 65-23 Years Old, Male Preventive care refers to lifestyle choices and visits with your health care provider that can promote health and wellness. Preventive care visits are also called wellness exams. What can I expect for my preventive care visit? Counseling During your preventive care visit, your health care provider may ask about your: Medical history, including: Past medical problems. Family medical history. Current health, including: Emotional well-being. Home life and relationship well-being. Sexual activity. Lifestyle, including: Alcohol, nicotine or tobacco, and drug use. Access to firearms. Diet,  exercise, and sleep habits. Safety issues such as seatbelt and bike helmet use. Sunscreen use. Work and work Astronomer. Physical exam Your health care provider will check your: Height and weight. These may be used to calculate your BMI (body mass index). BMI is a measurement that tells if you are at a healthy weight. Waist circumference. This measures the distance around your waistline. This measurement also tells if you are at a healthy weight and may help predict your risk of certain diseases, such as type 2 diabetes and high blood pressure. Heart rate and blood pressure. Body temperature. Skin for abnormal spots. What immunizations do I need?  Vaccines are usually given at various ages, according to a schedule. Your health care provider will recommend vaccines for you based on your age, medical history, and lifestyle or other factors, such as travel or where you work. What tests do I need? Screening Your health care provider may recommend screening tests for certain conditions. This may include: Lipid and cholesterol levels. Diabetes screening. This is done by checking your blood sugar (glucose) after you have not eaten for a while (fasting). Hepatitis B test. Hepatitis C test. HIV (human immunodeficiency virus) test. STI (sexually transmitted infection) testing, if you are at risk. Lung cancer screening. Prostate cancer screening. Colorectal cancer screening. Talk with your health care provider about your test results, treatment options, and if necessary, the need for more tests. Follow these instructions  at home: Eating and drinking  Eat a diet that includes fresh fruits and vegetables, whole grains, lean protein, and low-fat dairy products. Take vitamin and mineral supplements as recommended by your health care provider. Do not drink alcohol if your health care provider tells you not to drink. If you drink alcohol: Limit how much you have to 0-2 drinks a day. Know how much  alcohol is in your drink. In the [place] one drink equals one 12 oz bottle of beer (355 mL), one 5 oz glass of wine (148 mL), or one 1 oz glass of hard liquor (44 mL). Lifestyle Brush your teeth every morning and night with fluoride toothpaste. Floss one time each day. Exercise for at least 30 minutes 5 or more days each week. Do not use any products that contain nicotine or tobacco. These products include cigarettes, chewing tobacco, and vaping devices, such as e-cigarettes. If you need help quitting, ask your health care provider. Do not use drugs. If you are sexually active, practice safe sex. Use a condom or other form of protection to prevent STIs. Take aspirin only as told by your health care provider. Make sure that you understand how much to take and what form to take. Work with your health care provider to find out whether it is safe and beneficial for you to take aspirin daily. Find healthy ways to manage stress, such as: Meditation, yoga, or listening to music. Journaling. Talking to a trusted person. Spending time with friends and family. Minimize exposure to UV radiation to reduce your risk of skin cancer. Safety Always wear your seat belt while driving or riding in a vehicle. Do not drive: If you have been drinking alcohol. Do not ride with someone who has been drinking. When you are tired or distracted. While texting. If you have been using any mind-altering substances or drugs. Wear a helmet and other protective equipment during sports activities. If you have firearms in your house, make sure you follow all gun safety procedures. What's next? Go to your health care provider once a year for an annual wellness visit. Ask your health care provider how often you should have your eyes and teeth checked. Stay up to date on all vaccines. This information is not intended to replace advice given to you by your health care provider. Make sure you discuss any questions you have with  your health care provider. Document Revised: 08/19/2020 Document Reviewed: 08/19/2020 Elsevier Patient Education  2023 [employer].

## 2021-10-26 NOTE — Assessment & Plan Note (Signed)
Stable on omeprazole 40mg daily.  

## 2021-10-26 NOTE — Assessment & Plan Note (Signed)
Doing well.  No recent flares.  He will continue Valtrex 1000 mg daily for suppressive therapy.

## 2021-10-26 NOTE — Assessment & Plan Note (Signed)
Stable on alternating Cialis and Viagra as needed.

## 2021-10-27 LAB — HEPATITIS C ANTIBODY: Hepatitis C Ab: NONREACTIVE

## 2021-10-29 NOTE — Progress Notes (Signed)
Please inform patient of the following:  Labs are all stable.  Do not need to make any changes to his treatment plan at this time.  He should continue to work on diet and exercise and we can recheck in a year.

## 2021-11-19 ENCOUNTER — Telehealth: Payer: Self-pay | Admitting: Family Medicine

## 2021-11-19 NOTE — Telephone Encounter (Signed)
His total cholesterol is at goal.  His triglycerides were mildly elevated above 300.  These can be elevated if he was not fasting at the time of blood draw.  Typically we do not need to do any sort of medications for triglycerides unless they are very elevated above 800.  He should continue to work on diet and exercise and we can recheck this in a year.

## 2021-11-19 NOTE — Telephone Encounter (Signed)
Left message to return call to our office at their convenience.  

## 2021-11-19 NOTE — Telephone Encounter (Signed)
Spoke with patient  Patient has question about his cholesterol been elevated  He wants to know what to do next since is over 300 Please advise

## 2021-11-19 NOTE — Telephone Encounter (Signed)
Pt states: -Saw raw data of recent labs -Was not contacted about what it all means.  Pt requests: -call back.

## 2021-11-25 ENCOUNTER — Encounter: Payer: Self-pay | Admitting: *Deleted

## 2022-02-08 ENCOUNTER — Other Ambulatory Visit: Payer: Self-pay | Admitting: Family Medicine

## 2022-02-24 ENCOUNTER — Ambulatory Visit (INDEPENDENT_AMBULATORY_CARE_PROVIDER_SITE_OTHER): Payer: BC Managed Care – PPO | Admitting: Family

## 2022-02-24 ENCOUNTER — Encounter: Payer: Self-pay | Admitting: Family

## 2022-02-24 VITALS — BP 123/56 | HR 56 | Temp 97.2°F | Ht 73.0 in | Wt 206.2 lb

## 2022-02-24 DIAGNOSIS — R059 Cough, unspecified: Secondary | ICD-10-CM | POA: Diagnosis not present

## 2022-02-24 LAB — POCT INFLUENZA A/B
Influenza A, POC: NEGATIVE
Influenza B, POC: NEGATIVE

## 2022-02-24 LAB — POC COVID19 BINAXNOW: SARS Coronavirus 2 Ag: NEGATIVE

## 2022-02-24 MED ORDER — GUAIFENESIN-CODEINE 100-10 MG/5ML PO SYRP: 5.0000 mL | ORAL_SOLUTION | Freq: Three times a day (TID) | ORAL | 0 refills | Status: DC | PRN

## 2022-02-24 MED ORDER — BENZONATATE 200 MG PO CAPS: 200.0000 mg | ORAL_CAPSULE | Freq: Three times a day (TID) | ORAL | 0 refills | Status: DC | PRN

## 2022-02-24 NOTE — Progress Notes (Signed)
Patient ID: Jacob Buchanan, male    DOB: May 10, 1970, 51 y.o.   MRN: [id number]  Chief Complaint  Patient presents with   Cough    sx for 3d    HPI:      URI sx:  Pt cough with yellow mucus, chest/head congestion, fever of 102.0 last 2d, all sx present for 3 days. Has tried elderberry, Theraflu last 2 days, Dayquil/Nyquil which did help a little with symptoms. Denies sore throat or body aches.  Assessment & Plan:  1. Cough, unspecified type Rapid testing neg. sending Tessalon pearles and cough syrup. Advised to continue OTC meds, don't take Nyquil with Cheratussin, increase fluids to 2L/d, use saline nasal spray tid and Flonase bid for the next week, humidifier overnight.  - POC COVID-19 - POCT Influenza A/B - benzonatate (TESSALON) 200 MG capsule; Take 1 capsule (200 mg total) by mouth 3 (three) times daily as needed for up to 10 days for cough.  Dispense: 30 capsule; Refill: 0 - guaiFENesin-codeine (ROBITUSSIN AC) 100-10 MG/5ML syrup; Take 5 mLs by mouth 3 (three) times daily as needed for cough (Do NOT drive while using.).  Dispense: 118 mL; Refill: 0  Subjective:    Outpatient Medications Prior to Visit  Medication Sig Dispense Refill   omeprazole (PRILOSEC) 40 MG capsule TAKE ONE CAPSULE BY MOUTH TWICE A DAY 180 capsule 1   sildenafil (VIAGRA) 100 MG tablet Take 0.5-1 tablets (50-100 mg total) by mouth daily as needed for erectile dysfunction. 90 tablet 3   tadalafil (CIALIS) 20 MG tablet Take 0.5-1 tablets (10-20 mg total) by mouth daily as needed for erectile dysfunction. 90 tablet 3   valACYclovir (VALTREX) 1000 MG tablet TAKE ONE TABLET BY MOUTH DAILY (Patient taking differently: Take 500 mg by mouth as needed.) 90 tablet 0   azelastine (ASTELIN) 0.1 % nasal spray Place 2 sprays into both nostrils 2 (two) times daily. (Patient not taking: Reported on 02/24/2022) 30 mL 12   fluticasone (FLONASE) 50 MCG/ACT nasal spray Place 1 spray into both nostrils daily. (Patient not  taking: Reported on 02/24/2022)     No facility-administered medications prior to visit.   Past Medical History:  Diagnosis Date   Anemia    ED (erectile dysfunction)    Esophageal stricture    GERD (gastroesophageal reflux disease)    Sullivan Lone syndrome    Hyperlipidemia    Incomplete right bundle branch block    Partial tear of right rotator cuff    Past Surgical History:  Procedure Laterality Date   COLONOSCOPY  2004 ; 2007; & 05-29-2012   last one with propofol and hemorroidectomy with sclerosing   COLONOSCOPY  09/05/2019   FINGER SURGERY  1994   repair/ reconstruction right index crush injury   INGUINAL HERNIA REPAIR Bilateral age 68   SHOULDER ARTHROSCOPY WITH LABRAL REPAIR Right 12/31/2013   Procedure: RIGHT SHOULDER ARTHROSCOPY WITH LABRAL REPAIR ;  Surgeon: Eugenia Mcalpine, MD;  Location: [facility] [place];  Service: Orthopedics;  Laterality: Right;   UPPER GI ENDOSCOPY  2015   esophageal dilation   No Known Allergies    Objective:    Physical Exam Vitals and nursing note reviewed.  Constitutional:      General: He is not in acute distress.    Appearance: Normal appearance. He is ill-appearing.  HENT:     Head: Normocephalic.     Right Ear: Tympanic membrane and ear canal normal.     Left Ear: Tympanic membrane and ear canal  normal.     Nose:     Right Sinus: No maxillary sinus tenderness or frontal sinus tenderness.     Left Sinus: No maxillary sinus tenderness or frontal sinus tenderness.     Mouth/Throat:     Mouth: Mucous membranes are moist.     Pharynx: Posterior oropharyngeal erythema present. No pharyngeal swelling, oropharyngeal exudate or uvula swelling.     Tonsils: No tonsillar exudate or tonsillar abscesses.  Cardiovascular:     Rate and Rhythm: Normal rate and regular rhythm.  Pulmonary:     Effort: Pulmonary effort is normal.     Breath sounds: Normal breath sounds.  Musculoskeletal:        General: Normal range of motion.      Cervical back: Normal range of motion.  Lymphadenopathy:     Head:     Right side of head: No preauricular or posterior auricular adenopathy.     Left side of head: No preauricular or posterior auricular adenopathy.     Cervical: No cervical adenopathy.  Skin:    General: Skin is warm and dry.  Neurological:     Mental Status: He is alert and oriented to person, place, and time.  Psychiatric:        Mood and Affect: Mood normal.    BP (!) 123/56 (BP Location: Left Arm, Patient Position: Sitting, Cuff Size: Large)   Pulse (!) 56   Temp (!) 97.2 F (36.2 C) (Temporal)   Ht 6\' 1"  (1.854 m)   Wt 206 lb 4 oz (93.6 kg)   SpO2 96%   BMI 27.21 kg/m  Wt Readings from Last 3 Encounters:  02/24/22 206 lb 4 oz (93.6 kg)  10/26/21 205 lb (93 kg)  05/07/21 214 lb 3.2 oz (97.2 kg)       07/07/21, NP

## 2022-02-27 ENCOUNTER — Other Ambulatory Visit: Payer: Self-pay

## 2022-02-27 ENCOUNTER — Encounter (HOSPITAL_COMMUNITY): Payer: Self-pay

## 2022-02-27 ENCOUNTER — Emergency Department (HOSPITAL_BASED_OUTPATIENT_CLINIC_OR_DEPARTMENT_OTHER): Payer: BC Managed Care – PPO

## 2022-02-27 ENCOUNTER — Observation Stay (HOSPITAL_BASED_OUTPATIENT_CLINIC_OR_DEPARTMENT_OTHER)
Admission: EM | Admit: 2022-02-27 | Discharge: 2022-03-01 | Disposition: A | Discharge status: Home / Self Care | Payer: BC Managed Care – PPO | Attending: Family Medicine | Admitting: Family Medicine

## 2022-02-27 DIAGNOSIS — I5041 Acute combined systolic (congestive) and diastolic (congestive) heart failure: Secondary | ICD-10-CM | POA: Diagnosis not present

## 2022-02-27 DIAGNOSIS — J189 Pneumonia, unspecified organism: Secondary | ICD-10-CM | POA: Diagnosis not present

## 2022-02-27 DIAGNOSIS — Z1152 Encounter for screening for COVID-19: Secondary | ICD-10-CM | POA: Insufficient documentation

## 2022-02-27 DIAGNOSIS — I4891 Unspecified atrial fibrillation: Secondary | ICD-10-CM | POA: Diagnosis not present

## 2022-02-27 DIAGNOSIS — R918 Other nonspecific abnormal finding of lung field: Secondary | ICD-10-CM | POA: Diagnosis not present

## 2022-02-27 DIAGNOSIS — B009 Herpesviral infection, unspecified: Secondary | ICD-10-CM | POA: Diagnosis not present

## 2022-02-27 DIAGNOSIS — Z79899 Other long term (current) drug therapy: Secondary | ICD-10-CM | POA: Diagnosis not present

## 2022-02-27 DIAGNOSIS — R0602 Shortness of breath: Secondary | ICD-10-CM | POA: Diagnosis not present

## 2022-02-27 DIAGNOSIS — J210 Acute bronchiolitis due to respiratory syncytial virus: Secondary | ICD-10-CM | POA: Insufficient documentation

## 2022-02-27 DIAGNOSIS — J069 Acute upper respiratory infection, unspecified: Secondary | ICD-10-CM | POA: Diagnosis not present

## 2022-02-27 DIAGNOSIS — K219 Gastro-esophageal reflux disease without esophagitis: Secondary | ICD-10-CM | POA: Diagnosis present

## 2022-02-27 DIAGNOSIS — I480 Paroxysmal atrial fibrillation: Principal | ICD-10-CM | POA: Diagnosis present

## 2022-02-27 DIAGNOSIS — R0600 Dyspnea, unspecified: Secondary | ICD-10-CM | POA: Diagnosis not present

## 2022-02-27 DIAGNOSIS — Z7952 Long term (current) use of systemic steroids: Secondary | ICD-10-CM | POA: Insufficient documentation

## 2022-02-27 DIAGNOSIS — J121 Respiratory syncytial virus pneumonia: Secondary | ICD-10-CM | POA: Diagnosis not present

## 2022-02-27 DIAGNOSIS — E0590 Thyrotoxicosis, unspecified without thyrotoxic crisis or storm: Secondary | ICD-10-CM | POA: Insufficient documentation

## 2022-02-27 HISTORY — DX: Unspecified atrial fibrillation: I48.91

## 2022-02-27 HISTORY — DX: Anogenital herpesviral infection, unspecified: A60.9

## 2022-02-27 LAB — RESP PANEL BY RT-PCR (RSV, FLU A&B, COVID)  RVPGX2
Influenza A by PCR: NEGATIVE
Influenza B by PCR: NEGATIVE
Resp Syncytial Virus by PCR: POSITIVE — AB
SARS Coronavirus 2 by RT PCR: NEGATIVE

## 2022-02-27 LAB — BASIC METABOLIC PANEL
Anion gap: 10 (ref 5–15)
BUN: 15 mg/dL (ref 6–20)
CO2: 28 mmol/L (ref 22–32)
Calcium: 9.2 mg/dL (ref 8.9–10.3)
Chloride: 102 mmol/L (ref 98–111)
Creatinine, Ser: 1.05 mg/dL (ref 0.61–1.24)
GFR, Estimated: >60 mL/min (ref >60)
Glucose, Bld: 94 mg/dL (ref 70–99)
Potassium: 3.8 mmol/L (ref 3.5–5.1)
Sodium: 140 mmol/L (ref 135–145)

## 2022-02-27 LAB — CBC WITH DIFFERENTIAL/PLATELET
Abs Immature Granulocytes: 0.01 10*3/uL (ref 0.00–0.07)
Basophils Absolute: 0 10*3/uL (ref 0.0–0.1)
Basophils Relative: 1 %
Eosinophils Absolute: 0.2 10*3/uL (ref 0.0–0.5)
Eosinophils Relative: 4 %
HCT: 49 % (ref 39.0–52.0)
Hemoglobin: 17.3 g/dL — ABNORMAL HIGH (ref 13.0–17.0)
Immature Granulocytes: 0 %
Lymphocytes Relative: 30 %
Lymphs Abs: 1.4 10*3/uL (ref 0.7–4.0)
MCH: 33.9 pg (ref 26.0–34.0)
MCHC: 35.3 g/dL (ref 30.0–36.0)
MCV: 95.9 fL (ref 80.0–100.0)
Monocytes Absolute: 0.6 10*3/uL (ref 0.1–1.0)
Monocytes Relative: 13 %
Neutro Abs: 2.5 10*3/uL (ref 1.7–7.7)
Neutrophils Relative %: 52 %
Platelets: 134 10*3/uL — ABNORMAL LOW (ref 150–400)
RBC: 5.11 MIL/uL (ref 4.22–5.81)
RDW: 12.3 % (ref 11.5–15.5)
WBC: 4.7 10*3/uL (ref 4.0–10.5)
nRBC: 0 % (ref 0.0–0.2)

## 2022-02-27 LAB — MAGNESIUM: Magnesium: 2.1 mg/dL (ref 1.7–2.4)

## 2022-02-27 LAB — BRAIN NATRIURETIC PEPTIDE: B Natriuretic Peptide: 273.5 pg/mL — ABNORMAL HIGH (ref 0.0–100.0)

## 2022-02-27 MED ORDER — IPRATROPIUM-ALBUTEROL 0.5-2.5 (3) MG/3ML IN SOLN
6.0000 mL | Freq: Once | RESPIRATORY_TRACT | Status: DC
  Filled 2022-02-27: qty 3

## 2022-02-27 MED ORDER — IPRATROPIUM-ALBUTEROL 0.5-2.5 (3) MG/3ML IN SOLN
3.0000 mL | Freq: Once | RESPIRATORY_TRACT | Status: AC
  Administered 2022-02-27: 3 mL via RESPIRATORY_TRACT
  Filled 2022-02-27: qty 3

## 2022-02-27 MED ORDER — IPRATROPIUM-ALBUTEROL 0.5-2.5 (3) MG/3ML IN SOLN
3.0000 mL | Freq: Once | RESPIRATORY_TRACT | Status: AC
  Administered 2022-02-27: 3 mL via RESPIRATORY_TRACT

## 2022-02-27 MED ORDER — FUROSEMIDE 10 MG/ML IJ SOLN
20.0000 mg | Freq: Once | INTRAMUSCULAR | Status: AC
  Administered 2022-02-27: 20 mg via INTRAVENOUS
  Filled 2022-02-27: qty 2

## 2022-02-27 MED ORDER — HEPARIN (PORCINE) 25000 UT/250ML-% IV SOLN
1500.0000 [IU]/h | INTRAVENOUS | Status: AC
  Administered 2022-02-27 – 2022-02-28 (×2): 1500 [IU]/h via INTRAVENOUS
  Filled 2022-02-27 (×3): qty 250

## 2022-02-27 MED ORDER — DILTIAZEM HCL 25 MG/5ML IV SOLN
20.0000 mg | Freq: Once | INTRAVENOUS | Status: AC
  Administered 2022-02-27: 20 mg via INTRAVENOUS
  Filled 2022-02-27: qty 5

## 2022-02-27 MED ORDER — DILTIAZEM HCL-DEXTROSE 125-5 MG/125ML-% IV SOLN (PREMIX)
5.0000 mg/h | INTRAVENOUS | Status: DC
  Administered 2022-02-27: 5 mg/h via INTRAVENOUS
  Administered 2022-02-28: 10 mg/h via INTRAVENOUS
  Administered 2022-02-28 (×3): 15 mg/h via INTRAVENOUS
  Administered 2022-03-01: 10 mg/h via INTRAVENOUS
  Filled 2022-02-27 (×4): qty 125

## 2022-02-27 MED ORDER — METHYLPREDNISOLONE SODIUM SUCC 125 MG IJ SOLR
125.0000 mg | Freq: Once | INTRAMUSCULAR | Status: AC
Start: 2022-02-27 — End: 2022-02-27
  Administered 2022-02-27: 125 mg via INTRAVENOUS
  Filled 2022-02-27: qty 2

## 2022-02-27 MED ORDER — HEPARIN BOLUS VIA INFUSION
4000.0000 [IU] | Freq: Once | INTRAVENOUS | Status: AC
  Administered 2022-02-27: 4000 [IU] via INTRAVENOUS

## 2022-02-27 NOTE — ED Triage Notes (Signed)
Pt states he's been "unable to breathe, sick" x5 days. States he went to PCP Thursday, tested negative for covid & flu. Abx & cough medicine since, but advised "it's not done anything."

## 2022-02-27 NOTE — ED Provider Notes (Signed)
.  Critical Care  Performed by: Terald Sleeper, MD Authorized by: Terald Sleeper, MD   Critical care provider statement:    Critical care time (minutes):  35   Critical care time was exclusive of:  Separately billable procedures and treating other patients   Critical care was necessary to treat or prevent imminent or life-threatening deterioration of the following conditions:  Circulatory failure   Critical care was time spent personally by me on the following activities:  Ordering and performing treatments and interventions, ordering and review of laboratory studies, ordering and review of radiographic studies, pulse oximetry, review of old charts, examination of patient and evaluation of patient's response to treatment Comments:     A Fib heart-rate management     Terald Sleeper, MD 02/27/22 2309

## 2022-02-27 NOTE — ED Notes (Addendum)
Consult to Hospitalist - Called [facility] at 1106pm, spoke to [place]. - Responded at 1126pm.

## 2022-02-27 NOTE — Progress Notes (Signed)
ANTICOAGULATION CONSULT NOTE - Initial Consult  Pharmacy Consult for heparin Indication: atrial fibrillation  No Known Allergies  Patient Measurements: Height: 6\' 1"  (185.4 cm) Weight: 93.6 kg (206 lb 5.6 oz) IBW/kg (Calculated) : 79.9  Vital Signs: Temp: 98.4 F (36.9 C) (12/24 1749) Temp Source: Oral (12/24 1748) BP: 108/96 (12/24 2011) Pulse Rate: 85 (12/24 2118)  Labs: Recent Labs    02/27/22 2148  HGB 17.3*  HCT 49.0  PLT 134*  CREATININE 1.05    Estimated Creatinine Clearance: 91.9 mL/min (by C-G formula based on SCr of 1.05 mg/dL).   Medical History: Past Medical History:  Diagnosis Date   Anemia    ED (erectile dysfunction)    Esophageal stricture    GERD (gastroesophageal reflux disease)    03/01/22 syndrome    Hyperlipidemia    Incomplete right bundle branch block    Partial tear of right rotator cuff     Assessment: 51yo male c/o cough, congestion, and SOB for several days; while in ED he was noted to have gone into Afib w/ RVR, possible onset of Afib a few days ago; pt states he has had Afib in the past but apparently converted and is not currently on meds for Afib; attempts to convert with diltiazem bolus failed >> to start heparin.  Goal of Therapy:  Heparin level 0.3-0.7 units/ml Monitor platelets by anticoagulation protocol: Yes   Plan:  Heparin 4000 units IV bolus x1 followed by infusion at 1500 units/hr. Monitor heparin levels and CBC.  Sullivan Lone, PharmD, BCPS  02/27/2022,11:23 PM

## 2022-02-27 NOTE — ED Provider Notes (Addendum)
[facility] EMERGENCY DEPT Provider Note   CSN: [id number] Arrival date & time: 02/27/22  1705     History  Chief Complaint  Patient presents with   URI    Jacob Buchanan is a 50 y.o. male.  With history of GERD, anemia, hyperlipidemia who presents to the ED for evaluation of cough and shortness of breath.  States he has had the symptoms for 5 days.  Was febrile the first 2 days, but has not had a fever since then.  Presented to his primary care provider 3 days ago and was tested for flu and COVID.  These are negative.  He was given cough medicine and Tessalon Perles. he states that his symptoms have not improved.  He states that he was unable to catch his breath well today due to the cough but which caused his presentation.  Denies chest pain.  Has congestion and rhinorrhea as well.   URI Presenting symptoms: congestion, cough and rhinorrhea   Associated symptoms: wheezing        Home Medications Prior to Admission medications   Medication Sig Start Date End Date Taking? Authorizing Provider  azelastine (ASTELIN) 0.1 % nasal spray Place 2 sprays into both nostrils 2 (two) times daily. Patient not taking: Reported on 02/24/2022 03/19/21   Ardith Dark, MD  benzonatate (TESSALON) 200 MG capsule Take 1 capsule (200 mg total) by mouth 3 (three) times daily as needed for up to 10 days for cough. 02/24/22 03/06/22  Dulce Sellar, NP  fluticasone (FLONASE) 50 MCG/ACT nasal spray Place 1 spray into both nostrils daily. Patient not taking: Reported on 02/24/2022    [provider]  guaiFENesin-codeine (ROBITUSSIN AC) 100-10 MG/5ML syrup Take 5 mLs by mouth 3 (three) times daily as needed for cough (Do NOT drive while using.). 02/24/22   Dulce Sellar, NP  omeprazole (PRILOSEC) 40 MG capsule TAKE ONE CAPSULE BY MOUTH TWICE A DAY 08/09/21   Ardith Dark, MD  sildenafil (VIAGRA) 100 MG tablet Take 0.5-1 tablets (50-100 mg total) by mouth daily as  needed for erectile dysfunction. 05/07/21   Ardith Dark, MD  tadalafil (CIALIS) 20 MG tablet Take 0.5-1 tablets (10-20 mg total) by mouth daily as needed for erectile dysfunction. 05/07/21   Ardith Dark, MD  valACYclovir (VALTREX) 1000 MG tablet TAKE ONE TABLET BY MOUTH DAILY Patient taking differently: Take 500 mg by mouth as needed. 02/08/22   Ardith Dark, MD      Allergies    Patient has no known allergies.    Review of Systems   Review of Systems  HENT:  Positive for congestion and rhinorrhea.   Respiratory:  Positive for cough, shortness of breath and wheezing.   All other systems reviewed and are negative.   Physical Exam Updated Vital Signs BP (!) 108/96   Pulse 85   Temp 98.4 F (36.9 C)   Resp 18   SpO2 94%  Physical Exam Vitals and nursing note reviewed.  Constitutional:      General: He is in acute distress (mild).     Appearance: Normal appearance. He is well-developed and normal weight. He is not ill-appearing, toxic-appearing or diaphoretic.  HENT:     Head: Normocephalic and atraumatic.     Mouth/Throat:     Mouth: Mucous membranes are moist.     Pharynx: Oropharynx is clear. No oropharyngeal exudate or posterior oropharyngeal erythema.  Eyes:     Conjunctiva/sclera: Conjunctivae normal.  Cardiovascular:  Rate and Rhythm: Tachycardia present. Rhythm irregular.  Pulmonary:     Effort: Pulmonary effort is normal.     Breath sounds: Wheezing (Expiratory) present.     Comments: Persistent coughing Abdominal:     Palpations: Abdomen is soft.     Tenderness: There is no abdominal tenderness.  Musculoskeletal:        General: No swelling.     Cervical back: Neck supple.     Right lower leg: No edema.     Left lower leg: No edema.  Skin:    General: Skin is warm and dry.     Capillary Refill: Capillary refill takes less than 2 seconds.  Neurological:     General: No focal deficit present.     Mental Status: He is alert and oriented to person,  place, and time.  Psychiatric:        Mood and Affect: Mood normal.        Behavior: Behavior normal.     ED Results / Procedures / Treatments   Labs (all labs ordered are listed, but only abnormal results are displayed) Labs Reviewed  RESP PANEL BY RT-PCR (RSV, FLU A&B, COVID)  RVPGX2 - Abnormal; Notable for the following components:      Result Value   Resp Syncytial Virus by PCR POSITIVE (*)    All other components within normal limits  BASIC METABOLIC PANEL  CBC WITH DIFFERENTIAL/PLATELET  MAGNESIUM  BRAIN NATRIURETIC PEPTIDE    EKG EKG Interpretation  Date/Time:  Sunday February 27 2022 21:42:26 EST Ventricular Rate:  147 PR Interval:    QRS Duration: 99 QT Interval:  306 QTC Calculation: 479 R Axis:   78 Text Interpretation: Atrial fibrillation Ventricular premature complex Anteroseptal infarct, age indeterminate Confirmed by Octaviano Glow [phone]) on 02/27/2022 9:48:25 PM  Radiology No results found.  Procedures Procedures    Medications Ordered in ED Medications  methylPREDNISolone sodium succinate (SOLU-MEDROL) 125 mg/2 mL injection 125 mg (has no administration in time range)  ipratropium-albuterol (DUONEB) 0.5-2.5 (3) MG/3ML nebulizer solution 3 mL (3 mLs Nebulization Given 02/27/22 2010)  ipratropium-albuterol (DUONEB) 0.5-2.5 (3) MG/3ML nebulizer solution 3 mL (3 mLs Nebulization Given 02/27/22 2117)    ED Course/ Medical Decision Making/ A&P                           Medical Decision Making Amount and/or Complexity of Data Reviewed Labs: ordered. Radiology: ordered. ECG/medicine tests: ordered.  Risk Prescription drug management.  This patient presents to the ED for concern of okay cough, shortness of breath and wheezing, this involves an extensive number of treatment options, and is a complaint that carries with it a high risk of complications and morbidity.  The emergent differential diagnosis for shortness of breath includes, but is not  limited to, Pulmonary edema, bronchoconstriction, Pneumonia, Pulmonary embolism, Pneumotherax/ Hemothorax, Dysrythmia, ACS.     Co morbidities that complicate the patient evaluation  GERD, anemia, hyperlipidemia  My initial workup includes respiratory panel, DuoNeb, Solu-Medrol  Additional history obtained from: Nursing notes from this visit. Previous records within EMR system office visit on 02/24/2022 for same  I ordered, reviewed and interpreted labs which include: Respiratory panel.  RSV positive.  Afebrile, hypertensive.  50 year old male presenting to the ED for evaluation of cough and shortness of breath.  Has had an upper respiratory infection for the past 5 days.  Saw his primary care provider 3 days ago and was started on cough medicine.  States his symptoms have progressively gotten worse.  Was unable to sleep through the night last night.  Developed shortness of breath secondary to the cough today.  Patient was seen by me after his initial DuoNeb treatment.  He was still wheezing at that time.  He was given a second DuoNeb treatment and Solu-Medrol was ordered.  He was found to have an abnormal pulse with some readings being as high as 150 bpm and some readings being in the mid 50s.  A-fib workup was initiated at that time.  Care will be handed off to Dr. Langston Masker at shift change for further investigation and treatment.  Please see his note for final disposition and decision making.  Note: Portions of this report may have been transcribed using voice recognition software. Every effort was made to ensure accuracy; however, inadvertent computerized transcription errors may still be present.         Final Clinical Impression(s) / ED Diagnoses Final diagnoses:  None    Rx / DC Orders ED Discharge Orders     None         Roylene Reason, PA-C 02/27/22 2150    Roylene Reason, PA-C 02/27/22 2152    Wyvonnia Dusky, MD 02/27/22 2308

## 2022-02-28 ENCOUNTER — Encounter (HOSPITAL_BASED_OUTPATIENT_CLINIC_OR_DEPARTMENT_OTHER): Payer: Self-pay | Admitting: Internal Medicine

## 2022-02-28 ENCOUNTER — Observation Stay (HOSPITAL_BASED_OUTPATIENT_CLINIC_OR_DEPARTMENT_OTHER): Payer: BC Managed Care – PPO

## 2022-02-28 DIAGNOSIS — I5041 Acute combined systolic (congestive) and diastolic (congestive) heart failure: Secondary | ICD-10-CM

## 2022-02-28 DIAGNOSIS — J210 Acute bronchiolitis due to respiratory syncytial virus: Secondary | ICD-10-CM | POA: Diagnosis not present

## 2022-02-28 DIAGNOSIS — J121 Respiratory syncytial virus pneumonia: Secondary | ICD-10-CM | POA: Diagnosis not present

## 2022-02-28 DIAGNOSIS — E0590 Thyrotoxicosis, unspecified without thyrotoxic crisis or storm: Secondary | ICD-10-CM

## 2022-02-28 DIAGNOSIS — I480 Paroxysmal atrial fibrillation: Secondary | ICD-10-CM

## 2022-02-28 DIAGNOSIS — R0602 Shortness of breath: Secondary | ICD-10-CM | POA: Diagnosis not present

## 2022-02-28 DIAGNOSIS — Z7952 Long term (current) use of systemic steroids: Secondary | ICD-10-CM | POA: Diagnosis not present

## 2022-02-28 DIAGNOSIS — Z79899 Other long term (current) drug therapy: Secondary | ICD-10-CM | POA: Diagnosis not present

## 2022-02-28 DIAGNOSIS — B009 Herpesviral infection, unspecified: Secondary | ICD-10-CM | POA: Diagnosis not present

## 2022-02-28 DIAGNOSIS — Z1152 Encounter for screening for COVID-19: Secondary | ICD-10-CM | POA: Diagnosis not present

## 2022-02-28 LAB — ECHOCARDIOGRAM COMPLETE
Height: 73 in
Weight: 3213.42 oz

## 2022-02-28 LAB — CBC
HCT: 48.2 % (ref 39.0–52.0)
Hemoglobin: 17 g/dL (ref 13.0–17.0)
MCH: 33.7 pg (ref 26.0–34.0)
MCHC: 35.3 g/dL (ref 30.0–36.0)
MCV: 95.4 fL (ref 80.0–100.0)
Platelets: 151 10*3/uL (ref 150–400)
RBC: 5.05 MIL/uL (ref 4.22–5.81)
RDW: 12.2 % (ref 11.5–15.5)
WBC: 6.4 10*3/uL (ref 4.0–10.5)
nRBC: 0 % (ref 0.0–0.2)

## 2022-02-28 LAB — TSH: TSH: 0.276 u[IU]/mL — ABNORMAL LOW (ref 0.350–4.500)

## 2022-02-28 LAB — HEPARIN LEVEL (UNFRACTIONATED)
Heparin Unfractionated: 0.46 IU/mL (ref 0.30–0.70)
Heparin Unfractionated: 0.65 IU/mL (ref 0.30–0.70)

## 2022-02-28 LAB — HIV ANTIBODY (ROUTINE TESTING W REFLEX): HIV Screen 4th Generation wRfx: NONREACTIVE

## 2022-02-28 MED ORDER — ACETAMINOPHEN 650 MG RE SUPP: 650.0000 mg | Freq: Four times a day (QID) | RECTAL | Status: DC | PRN

## 2022-02-28 MED ORDER — ACETAMINOPHEN 325 MG PO TABS: 650.0000 mg | ORAL_TABLET | Freq: Four times a day (QID) | ORAL | Status: DC | PRN

## 2022-02-28 MED ORDER — PANTOPRAZOLE SODIUM 40 MG PO TBEC
40.0000 mg | DELAYED_RELEASE_TABLET | Freq: Every day | ORAL | Status: DC
  Administered 2022-02-28 – 2022-03-01 (×2): 40 mg via ORAL
  Filled 2022-02-28 (×2): qty 1

## 2022-02-28 MED ORDER — ONDANSETRON HCL 4 MG/2ML IJ SOLN: 4.0000 mg | Freq: Four times a day (QID) | INTRAMUSCULAR | Status: DC | PRN

## 2022-02-28 MED ORDER — GUAIFENESIN 100 MG/5ML PO LIQD
5.0000 mL | Freq: Once | ORAL | Status: AC
  Administered 2022-02-28: 5 mL via ORAL
  Filled 2022-02-28: qty 10

## 2022-02-28 MED ORDER — BENZONATATE 100 MG PO CAPS
200.0000 mg | ORAL_CAPSULE | Freq: Three times a day (TID) | ORAL | Status: DC | PRN
  Administered 2022-02-28 (×2): 200 mg via ORAL
  Filled 2022-02-28 (×2): qty 2

## 2022-02-28 MED ORDER — HYDRALAZINE HCL 20 MG/ML IJ SOLN: 5.0000 mg | INTRAMUSCULAR | Status: DC | PRN

## 2022-02-28 MED ORDER — METOPROLOL TARTRATE 25 MG PO TABS
25.0000 mg | ORAL_TABLET | Freq: Three times a day (TID) | ORAL | Status: DC
  Administered 2022-02-28 – 2022-03-01 (×3): 25 mg via ORAL
  Filled 2022-02-28 (×3): qty 1

## 2022-02-28 MED ORDER — PSYLLIUM 95 % PO PACK
1.0000 | PACK | Freq: Every day | ORAL | Status: DC
  Administered 2022-02-28 – 2022-03-01 (×2): 1 via ORAL
  Filled 2022-02-28 (×2): qty 1

## 2022-02-28 MED ORDER — ONDANSETRON HCL 4 MG PO TABS: 4.0000 mg | ORAL_TABLET | Freq: Four times a day (QID) | ORAL | Status: DC | PRN

## 2022-02-28 MED ORDER — APIXABAN 5 MG PO TABS
5.0000 mg | ORAL_TABLET | Freq: Two times a day (BID) | ORAL | Status: DC
  Administered 2022-02-28 – 2022-03-01 (×2): 5 mg via ORAL
  Filled 2022-02-28 (×2): qty 1

## 2022-02-28 NOTE — Progress Notes (Signed)
ANTICOAGULATION CONSULT NOTE - Initial Consult  Pharmacy Consult for heparin Indication: atrial fibrillation  No Known Allergies  Patient Measurements: Height: 6\' 1"  (185.4 cm) Weight: 91.1 kg (200 lb 13.4 oz) IBW/kg (Calculated) : 79.9  Vital Signs: Temp: 98.2 F (36.8 C) (12/25 0900) Temp Source: Oral (12/25 0900) BP: 115/77 (12/25 0900) Pulse Rate: 88 (12/25 0900)  Labs: Recent Labs    02/27/22 2148 02/28/22 0633 02/28/22 1240 02/28/22 1241  HGB 17.3*  --  17.0  --   HCT 49.0  --  48.2  --   PLT 134*  --  151  --   HEPARINUNFRC  --  0.46  --  0.65  CREATININE 1.05  --   --   --      Estimated Creatinine Clearance: 91.9 mL/min (by C-G formula based on SCr of 1.05 mg/dL).   Medical History: Past Medical History:  Diagnosis Date   Anemia    Atrial fibrillation [facility])    ED (erectile dysfunction)    Esophageal stricture    GERD (gastroesophageal reflux disease)    [facility] syndrome    HSV (herpes simplex virus) anogenital infection    Hyperlipidemia    Incomplete right bundle branch block    Partial tear of right rotator cuff     Assessment: 51yo male c/o cough, congestion, and SOB for several days; while in ED he was noted to have gone into Afib w/ RVR, possible onset of Afib a few days ago; pt states he has had Afib in the past but apparently converted and is not currently on meds for Afib; attempts to convert with diltiazem bolus failed >> to start heparin.  Heparin level today is therapeutic at 0.65, on 1500 units/hr. Hgb 17.0, plts 151, no line issues or signs/symptoms of bleeding reported.  Goal of Therapy:  Heparin level 0.3-0.7 units/ml Monitor platelets by anticoagulation protocol: Yes   Plan:  Continue heparin gtt @ 1500 units/hr. Monitor heparin levels and CBC daily.   Sullivan Lone, PharmD PGY1 Pharmacy Resident 02/28/2022 2:31 PM

## 2022-02-28 NOTE — Progress Notes (Incomplete)
ANTICOAGULATION CONSULT NOTE - Initial Consult  Pharmacy Consult for heparin Indication: atrial fibrillation  No Known Allergies  Patient Measurements: Height: 6\' 1"  (185.4 cm) Weight: 91.1 kg (200 lb 13.4 oz) IBW/kg (Calculated) : 79.9  Vital Signs: Temp: 98.2 F (36.8 C) (12/25 0900) Temp Source: Oral (12/25 0900) BP: 115/77 (12/25 0900) Pulse Rate: 88 (12/25 0900)  Labs: Recent Labs    02/27/22 2148 02/28/22 0633 02/28/22 1241  HGB 17.3*  --   --   HCT 49.0  --   --   PLT 134*  --   --   HEPARINUNFRC  --  0.46 0.65  CREATININE 1.05  --   --      Estimated Creatinine Clearance: 91.9 mL/min (by C-G formula based on SCr of 1.05 mg/dL).   Medical History: Past Medical History:  Diagnosis Date   Anemia    Atrial fibrillation [facility])    ED (erectile dysfunction)    Esophageal stricture    GERD (gastroesophageal reflux disease)    [facility] syndrome    HSV (herpes simplex virus) anogenital infection    Hyperlipidemia    Incomplete right bundle branch block    Partial tear of right rotator cuff     Assessment: 51yo male c/o cough, congestion, and SOB for several days; while in ED he was noted to have gone into Afib w/ RVR, possible onset of Afib a few days ago; pt states he has had Afib in the past but apparently converted and is not currently on meds for Afib; attempts to convert with diltiazem bolus failed >> to start heparin.  Heparin level today is therapeutic at 0.65, on 1500 units/hr. No line issues or signs/symptoms of bleeding reported.  Goal of Therapy:  Heparin level 0.3-0.7 units/ml Monitor platelets by anticoagulation protocol: Yes   Plan:  Continue heparin gtt @ 1500 units/hr. Monitor heparin levels and CBC daily.   Sullivan Lone, PharmD PGY1 Pharmacy Resident 02/28/2022 1:34 PM

## 2022-02-28 NOTE — Progress Notes (Signed)
ANTICOAGULATION CONSULT NOTE - Initial Consult  Pharmacy Consult for heparin Indication: atrial fibrillation  No Known Allergies  Patient Measurements: Height: 6\' 1"  (185.4 cm) Weight: 93.6 kg (206 lb 5.6 oz) IBW/kg (Calculated) : 79.9  Vital Signs: Temp: 97.6 F (36.4 C) (12/25 0842) Temp Source: Oral (12/25 0842) BP: 112/89 (12/25 0842) Pulse Rate: 94 (12/25 0842)  Labs: Recent Labs    02/27/22 2148 02/28/22 0633  HGB 17.3*  --   HCT 49.0  --   PLT 134*  --   HEPARINUNFRC  --  0.46  CREATININE 1.05  --      Estimated Creatinine Clearance: 91.9 mL/min (by C-G formula based on SCr of 1.05 mg/dL).   Medical History: Past Medical History:  Diagnosis Date   Anemia    ED (erectile dysfunction)    Esophageal stricture    GERD (gastroesophageal reflux disease)    03/02/22 syndrome    Hyperlipidemia    Incomplete right bundle branch block    Partial tear of right rotator cuff     Assessment: 51yo male c/o cough, congestion, and SOB for several days; while in ED he was noted to have gone into Afib w/ RVR, possible onset of Afib a few days ago; pt states he has had Afib in the past but apparently converted and is not currently on meds for Afib; attempts to convert with diltiazem bolus failed >> to start heparin.  Heparin level today is therapeutic at 0.46, on 1500 units/hr. Hgb 17.3, plt 134--stable. No line issues or signs/symptoms of bleeding reported.  Goal of Therapy:  Heparin level 0.3-0.7 units/ml Monitor platelets by anticoagulation protocol: Yes   Plan:  Continue heparin gtt @ 1500 units/hr. Monitor heparin levels and CBC daily.   Sullivan Lone, PharmD PGY1 Pharmacy Resident 12/25/20238:52 AM

## 2022-02-28 NOTE — Progress Notes (Signed)
ANTICOAGULATION CONSULT NOTE   Pharmacy Consult for heparin >> apixaban  Indication: atrial fibrillation  No Known Allergies  Patient Measurements: Height: 6\' 1"  (185.4 cm) Weight: 91.1 kg (200 lb 13.4 oz) IBW/kg (Calculated) : 79.9  Vital Signs: Temp: 97.8 F (36.6 C) (12/25 1525) Temp Source: Oral (12/25 1525) BP: 128/87 (12/25 1525) Pulse Rate: 98 (12/25 1525)  Labs: Recent Labs    02/27/22 2148 02/28/22 0633 02/28/22 1240 02/28/22 1241  HGB 17.3*  --  17.0  --   HCT 49.0  --  48.2  --   PLT 134*  --  151  --   HEPARINUNFRC  --  0.46  --  0.65  CREATININE 1.05  --   --   --      Estimated Creatinine Clearance: 91.9 mL/min (by C-G formula based on SCr of 1.05 mg/dL).   Medical History: Past Medical History:  Diagnosis Date   Anemia    Atrial fibrillation [facility])    ED (erectile dysfunction)    Esophageal stricture    GERD (gastroesophageal reflux disease)    [facility] syndrome    HSV (herpes simplex virus) anogenital infection    Hyperlipidemia    Incomplete right bundle branch block    Partial tear of right rotator cuff     Assessment: 51yo male c/o cough, congestion, and SOB for several days; while in ED he was noted to have gone into Afib w/ RVR, possible onset of Afib a few days ago; pt states he has had Afib in the past but apparently converted and is not currently on meds for Afib; attempts to convert with diltiazem bolus failed. Pharmacy consulted to transition heparin to apixaban.   Age 51, Scr 1.05, weight 91.1 kg. CBC stable   Goal of Therapy:  Therapeutic anticoagulation  Monitor platelets by anticoagulation protocol: Yes   Plan:  Stop heparin infusion  Start Eliquis 5 mg PO BID Monitor s/sx bleeding  40, PharmD PGY1 Pharmacy Resident   02/28/2022  4:29 PM

## 2022-02-28 NOTE — ED Notes (Signed)
Pt now on 2L O2 via [place] - O2 desat to 86% while pt resting in supine with eyes closed -- O2 sat improvement to 95% on 2L O2 via [place]

## 2022-02-28 NOTE — Progress Notes (Signed)
  Echocardiogram 2D Echocardiogram has been performed.  Delcie Roch 02/28/2022, 3:04 PM

## 2022-02-28 NOTE — ED Notes (Signed)
This nurse has assumed care-- Entered room to find pt pacing @bedside .  Pt encouraged to remain on bedrest due to risk for of continued elevated HR; pt acknowledges verbal understanding but states it helps to sit at edge of bed when he begins to have persistent coughing.  Continuous cardiac and pulse ox maintained.  Hep drip and Cardizem drip continue to infuse without complication.  3rd SL now inserted by this nurse to L hand (20g); well tolerated by pt.

## 2022-02-28 NOTE — H&P (Signed)
History and Physical    Patient: Jacob Buchanan KWI:[id number] DOB: 02/26/1971 DOA: 02/27/2022 DOS: the patient was seen and examined on 02/28/2022 PCP: Ardith Dark, MD  Patient coming from: Home - lives with girlfriend; NOK: Girlfriend, Arline Asp [phone]   Chief Complaint: SOB  HPI: Jacob Buchanan is a 51 y.o. male with medical history significant of GERD and afib presenting with cough, SOB.   He reports that he started having a cough 6 days ago.  He had a fever the first couple of days.  He tried symptomatic treatment but it got worse.  He went to the PCP office and was negative for COVID/flu.  Fever had resolved.  He was given medications for cough.  He felt worse 2 days and then yesterday he didn't take anything medications since nothing was helping.  Towards the end of the day, he had a feeling that he has had for many months like he was out of shape, couldn't breathe, chest pressure.  He decided to come to the ER.  He can feel a fluttering when it goes up and down.  This has been present before his current infection.  He has been told he has had afib for over 20 years.  No LE edema.  Cough is productive of some mucus.    ER Course:  [facility] transfer, per Dr. Thomes Dinning:  Paroxysmal atrial fibrillation with RVR , RSV, Elevated BNP rule out CHF   5-day onset of cough and shortness of breath.  Fever in the first 2 days, resolved.  Saw PCP 3 days ago, negative for flu and COVID-19.  Tessalon Perles and cough medicine given did not improve symptoms. Persistent cough, increased work of breathing, chest congestion and rhinorrhea.  RSV positive. Patient was noted to be in A-fib with RVR and was started on diltiazem drip and heparin drip, IV Lasix was given due to elevated BNP with suspicion for possible CHF.     Review of Systems: As mentioned in the history of present illness. All other systems reviewed and are negative. Past Medical History:  Diagnosis Date   Anemia     Atrial fibrillation [facility])    ED (erectile dysfunction)    Esophageal stricture    GERD (gastroesophageal reflux disease)    Sullivan Lone syndrome    HSV (herpes simplex virus) anogenital infection    Hyperlipidemia    Incomplete right bundle branch block    Partial tear of right rotator cuff    Past Surgical History:  Procedure Laterality Date   COLONOSCOPY  2004 ; 2007; & 05-29-2012   last one with propofol and hemorroidectomy with sclerosing   COLONOSCOPY  09/05/2019   FINGER SURGERY  1994   repair/ reconstruction right index crush injury   INGUINAL HERNIA REPAIR Bilateral age 11   SHOULDER ARTHROSCOPY WITH LABRAL REPAIR Right 12/31/2013   Procedure: RIGHT SHOULDER ARTHROSCOPY WITH LABRAL REPAIR ;  Surgeon: Eugenia Mcalpine, MD;  Location: [facility];  Service: Orthopedics;  Laterality: Right;   UPPER GI ENDOSCOPY  2015   esophageal dilation   Social History:  reports that he has never smoked. He has never used smokeless tobacco. He reports current alcohol use of about 14.0 standard drinks of alcohol per week. He reports current drug use. Drug: Marijuana.  No Known Allergies  Family History  Problem Relation Age of Onset   Lung cancer Maternal Grandfather    Other Sister        Diabetes Insipidus  Rectal cancer Sister    Mitral valve prolapse Father    Ulcers Sister        peptic   Emphysema Paternal Grandfather    Lung cancer Paternal Grandfather    Stroke Neg Hx    Heart disease Neg Hx    Colon cancer Neg Hx    Colon polyps Neg Hx    Esophageal cancer Neg Hx    Stomach cancer Neg Hx     Prior to Admission medications   Medication Sig Start Date End Date Taking? Authorizing Provider  azelastine (ASTELIN) 0.1 % nasal spray Place 2 sprays into both nostrils daily as needed (sinus symptoms). Use in each nostril as directed   Yes [provider]  benzonatate (TESSALON) 200 MG capsule Take 1 capsule (200 mg total) by mouth 3 (three) times daily as  needed for up to 10 days for cough. 02/24/22 03/06/22 Yes Jeanie Sewer, NP  FERROUS SULFATE PO Take 1 tablet by mouth in the morning.   Yes [provider]  fluticasone (FLONASE) 50 MCG/ACT nasal spray Place 2 sprays into both nostrils daily as needed (sinus symptoms).   Yes [provider]  guaiFENesin-codeine (ROBITUSSIN AC) 100-10 MG/5ML syrup Take 5 mLs by mouth 3 (three) times daily as needed for cough (Do NOT drive while using.). Patient taking differently: Take 5 mLs by mouth 3 (three) times daily as needed for cough. 02/24/22  Yes Jeanie Sewer, NP  Multiple Vitamin (MULTIVITAMIN) tablet Take 1 tablet by mouth in the morning.   Yes [provider]  Multiple Vitamins-Minerals (IMMUNE SUPPORT PO) Take 1 tablet by mouth in the morning.   Yes [provider]  omeprazole (PRILOSEC) 40 MG capsule TAKE ONE CAPSULE BY MOUTH TWICE A DAY Patient taking differently: Take 40 mg by mouth in the morning. 08/09/21  Yes Vivi Barrack, MD  PE-Doxylamine-DM-GG-APAP (VICKS DAYQUIL/NYQUIL SEVERE PO) Take 30 mLs by mouth 2 (two) times daily as needed (flu/cold symptoms).   Yes [provider]  valACYclovir (VALTREX) 1000 MG tablet TAKE ONE TABLET BY MOUTH DAILY Patient taking differently: Take 500 mg by mouth See admin instructions. 500 mg 2-3 times weekly as preventative 02/08/22  Yes Vivi Barrack, MD    Physical Exam: Vitals:   02/28/22 0745 02/28/22 0800 02/28/22 0842 02/28/22 0900  BP: 102/82 117/89 112/89 115/77  Pulse: 61 62 94 88  Resp: 19 20 18 20   Temp:   97.6 F (36.4 C) 98.2 F (36.8 C)  TempSrc:   Oral Oral  SpO2: 95% 97% 92% 93%  Weight:    91.1 kg  Height:    6\' 1"  (1.854 m)   General:  Appears calm and comfortable and is in NAD, ambulating around room on RA without difficulty but with recurrent cough Eyes:   EOMI, normal lids, iris ENT:  grossly normal hearing, lips & tongue, mmm Neck:  no LAD, masses or  thyromegaly Cardiovascular:  Irregularly irregular with HR in upper 90s, no m/r/g. No LE edema.  Respiratory:   CTA bilaterally with no wheezes/rales/rhonchi.  Normal respiratory effort. Abdomen:  soft, NT, ND Skin:  no rash or induration seen on limited exam Musculoskeletal:  grossly normal tone BUE/BLE, good ROM, no bony abnormality Psychiatric:  grossly normal mood and affect, speech fluent and appropriate, AOx3 Neurologic:  CN 2-12 grossly intact, moves all extremities in coordinated fashion   Radiological Exams on Admission: Independently reviewed - see discussion in A/P where applicable  CT Chest Wo Contrast  Result Date: 02/27/2022 CLINICAL DATA:  Pneumonia, complication suspected.  RSV positive. EXAM: CT CHEST WITHOUT CONTRAST TECHNIQUE: Multidetector CT imaging of the chest was performed following the standard protocol without IV contrast. RADIATION DOSE REDUCTION: This exam was performed according to the departmental dose-optimization program which includes automated exposure control, adjustment of the mA and/or kV according to patient size and/or use of iterative reconstruction technique. COMPARISON:  02/27/2022, 04/05/2021. FINDINGS: Cardiovascular: The heart is normal in size and there is no pericardial effusion. The aorta and pulmonary trunk are normal in caliber. Mediastinum/Nodes: Prominent lymph nodes are noted in the mediastinum measuring up to 8 mm in the precarinal space. A 1.3 cm nodule is noted in the subcarinal space. Mildly enlarged lymph node is present at the right hilum measuring 1.1 cm. No axillary lymphadenopathy. The thyroid gland, trachea, and esophagus are within normal limits. Lungs/Pleura: The central airways are patent. Pleural and parenchymal biapical pleural scarring is noted. Scattered tree-in-bud nodular densities are seen in the lungs bilaterally and most pronounced in the left lower lobe. No consolidation, effusion, or pneumothorax. There is a stable 4 mm  subpleural nodule in the left lower lobe, axial image 122. Upper Abdomen: No acute abnormality. Musculoskeletal: Bilateral gynecomastia. Degenerative changes in the thoracic spine. No acute osseous abnormality. IMPRESSION: 1. Scattered tree-in-bud nodular densities in the lungs bilaterally, most pronounced in the left lower lobe and likely infectious or inflammatory. No follow-up needed if patient is low-risk (and has no known or suspected primary neoplasm). Non-contrast chest CT can be considered in 12 months if patient is high-risk. This recommendation follows the consensus statement: Guidelines for Management of Incidental Pulmonary Nodules Detected on CT Images: From the Fleischner Society 2017; Radiology 2017; 284:228-243. 2. Mediastinal and right hilar lymphadenopathy, likely reactive. 3. Stable subpleural 4 mm left lower lobe pulmonary nodule. No follow-up needed if patient is low-risk.This recommendation follows the consensus statement: Guidelines for Management of Incidental Pulmonary Nodules Detected on CT Images: From the Fleischner Society 2017; Radiology 2017; 284:228-243. Electronically Signed   By: Brett Fairy M.D.   On: 02/27/2022 23:36   DG Chest Portable 1 View  Result Date: 02/27/2022 CLINICAL DATA:  Dyspnea EXAM: PORTABLE CHEST 1 VIEW COMPARISON:  None Available. FINDINGS: Mild left basilar atelectasis or infiltrate. Lungs are otherwise clear. No pneumothorax or pleural effusion. Cardiac size within normal limits. Pulmonary vascularity is normal. No acute bone abnormality. IMPRESSION: 1. Mild left basilar atelectasis or infiltrate. Electronically Signed   By: Fidela Salisbury M.D.   On: 02/27/2022 22:17    EKG: Independently reviewed.  Afib with rate 147; nonspecific ST changes that may be rate related   Labs on Admission: I have personally reviewed the available labs and imaging studies at the time of the admission.  Pertinent labs:    Normal BMP BNP 273.5 Unremarkable CBC RSV  positive Lipids on 8/22: 199/38/99/309 A1c on 8/22: 5.1 TSH on 8/22: 1.33   Assessment and Plan: Principal Problem:   Paroxysmal atrial fibrillation with RVR (HCC) Active Problems:   GERD (gastroesophageal reflux disease)   Recurrent herpes simplex   RSV (respiratory syncytial virus pneumonia)    Afib with RVR -Patient presenting with new-onset afib - patient reports having had it for "20 years" but review of prior EKGs does not indicate prior history -Etiology is thought to be related to RSV infection -Since the afib onset is unknown, will focus on rate control and searching for the underlying cause at this time. -Will plan to place in observation  status in progressive care for Diltiazem drip as per protocol with plan to transition to PO Diltiazem once heart rate is controlled (resting HR 110 or lower).  -Will request Echocardiogram for further evaluation  -Will consult cardiology  -Heart rate is well controlled at this time. -CHA2DS2-VASc Score is <2 and so patient can likely be on ASA monotherapy; however, he was started on heparin drip at [facility] and will continue pending cardiology input. -There was some concern about CHF at [facility] but I do not see evidence of this currently; echo pending.  RSV -Supportive care -Anticipate resolution of tachycardia and improvement in symptoms as RSV improves  -?infiltrate on CXR but appears to be viral in nature at this time  GERD -Continue omeprazole (pantoprazole per formulary)  HSV -Takes prn Valacyclovir     Advance Care Planning:   Code Status: Full Code - Code status was discussed with the patient and/or family at the time of admission.  The patient would want to receive full resuscitative measures at this time.   Consults: Cardiology  DVT Prophylaxis: Heparin drip  Family Communication: None present; he declined to have me call his girlfriend at the time of admission  Severity of Illness: The appropriate patient status for this  patient is OBSERVATION. Observation status is judged to be reasonable and necessary in order to provide the required intensity of service to ensure the patient's safety. The patient's presenting symptoms, physical exam findings, and initial radiographic and laboratory data in the context of their medical condition is felt to place them at decreased risk for further clinical deterioration. Furthermore, it is anticipated that the patient will be medically stable for discharge from the hospital within 2 midnights of admission.   Author: Karmen Bongo, MD 02/28/2022 2:02 PM  For on call review [email or web address].

## 2022-02-28 NOTE — Consult Note (Signed)
Cardiology Consultation   Patient ID: Jacob Buchanan MRN: [id number]; DOB: 05-31-1970  Admit date: 02/27/2022 Date of Consult: 02/28/2022  PCP:  Vivi Barrack, [place] Providers Cardiologist:  None   Atrial fibrillation.  Rate 147 bpm.  PVCs.     Patient Profile:   Jacob Buchanan is a 51 y.o. male with a hx of  Gilbert's disease, remote PAF, and hyperlipidemia who is being seen 02/28/2022 for the evaluation of atrial fibrillation with RVR at the request of Dr. Lorin Mercy.  History of Present Illness:   Jacob Buchanan is a 51 year old male with past medical history of Gilbert's syndrome and hyperlipidemia and reported history of remote A-fib.  Patient reportedly had an episode of A-fib about 20 years ago, however he is not on any medication for A-fib.  I have reviewed old EKGs dating back to 2010, all which showed sinus rhythm with incomplete right bundle branch block.  He was in his usual state of health until roughly about a week ago he started having fever, chills and a productive cough.  Fever went away after 2 days.  He went to see his PCP who obtained COVID and influenza test, both of which were negative.  He took some over-the-counter medication which did not improve his overall symptom.  He eventually sought medical attention at [facility] on 07/29/2021.  EKG on arrival showed atrial fibrillation with RVR.  He was placed on IV diltiazem and IV heparin.  TSH was borderline low.  BNP 200.  CT of the chest showed inflammatory changes.  COVID and influenza test negative, however he was positive with RSV.  He was subsequently transferred to [facility] for additional management.  Heart rate is now in the high 90s to 110s range with better heart rate control.   Past Medical History:  Diagnosis Date   Anemia    Atrial fibrillation [facility])    ED (erectile dysfunction)    Esophageal stricture    GERD (gastroesophageal reflux disease)    Rosanna Randy syndrome     HSV (herpes simplex virus) anogenital infection    Hyperlipidemia    Incomplete right bundle branch block    Partial tear of right rotator cuff     Past Surgical History:  Procedure Laterality Date   COLONOSCOPY  2004 ; 2007; & 05-29-2012   last one with propofol and hemorroidectomy with sclerosing   COLONOSCOPY  09/05/2019   [place]   repair/ reconstruction right index crush injury   INGUINAL HERNIA REPAIR Bilateral age 8   SHOULDER ARTHROSCOPY WITH LABRAL REPAIR Right 12/31/2013   Procedure: RIGHT SHOULDER ARTHROSCOPY WITH LABRAL REPAIR ;  Surgeon: Sydnee Cabal, MD;  Location: [facility];  Service: Orthopedics;  Laterality: Right;   UPPER GI ENDOSCOPY  2015   esophageal dilation     Home Medications:  Prior to Admission medications   Medication Sig Start Date End Date Taking? Authorizing Provider  azelastine (ASTELIN) 0.1 % nasal spray Place 2 sprays into both nostrils daily as needed (sinus symptoms). Use in each nostril as directed   Yes [provider]  benzonatate (TESSALON) 200 MG capsule Take 1 capsule (200 mg total) by mouth 3 (three) times daily as needed for up to 10 days for cough. 02/24/22 03/06/22 Yes Jeanie Sewer, NP  FERROUS SULFATE PO Take 1 tablet by mouth in the morning.   Yes [provider]  fluticasone (FLONASE) 50 MCG/ACT nasal spray Place 2  sprays into both nostrils daily as needed (sinus symptoms).   Yes [provider]  guaiFENesin-codeine (ROBITUSSIN AC) 100-10 MG/5ML syrup Take 5 mLs by mouth 3 (three) times daily as needed for cough (Do NOT drive while using.). Patient taking differently: Take 5 mLs by mouth 3 (three) times daily as needed for cough. 02/24/22  Yes Dulce Sellar, NP  Multiple Vitamin (MULTIVITAMIN) tablet Take 1 tablet by mouth in the morning.   Yes [provider]  Multiple Vitamins-Minerals (IMMUNE SUPPORT PO) Take 1 tablet by mouth in the morning.   Yes  [provider]  omeprazole (PRILOSEC) 40 MG capsule TAKE ONE CAPSULE BY MOUTH TWICE A DAY Patient taking differently: Take 40 mg by mouth in the morning. 08/09/21  Yes Ardith Dark, MD  PE-Doxylamine-DM-GG-APAP (VICKS DAYQUIL/NYQUIL SEVERE PO) Take 30 mLs by mouth 2 (two) times daily as needed (flu/cold symptoms).   Yes [provider]  valACYclovir (VALTREX) 1000 MG tablet TAKE ONE TABLET BY MOUTH DAILY Patient taking differently: Take 500 mg by mouth See admin instructions. 500 mg 2-3 times weekly as preventative 02/08/22  Yes Ardith Dark, MD    Inpatient Medications: Scheduled Meds:  metoprolol tartrate  25 mg Oral Q8H   pantoprazole  40 mg Oral Daily   psyllium  1 packet Oral Daily   Continuous Infusions:  diltiazem (CARDIZEM) infusion 15 mg/hr (02/28/22 1549)   heparin 1,500 Units/hr (02/28/22 1319)   PRN Meds: acetaminophen **OR** acetaminophen, benzonatate, hydrALAZINE, ondansetron **OR** ondansetron (ZOFRAN) IV  Allergies:   No Known Allergies  Social History:   Social History   Socioeconomic History   Marital status: Divorced    Spouse name: Not on file   Number of children: 0   Years of education: Not on file   Highest education level: Not on file  Occupational History   Occupation: landscaper-self  employed  Tobacco Use   Smoking status: Never   Smokeless tobacco: Never  Substance and Sexual Activity   Alcohol use: Yes    Alcohol/week: 14.0 standard drinks of alcohol    Types: 14 Standard drinks or equivalent per week    Comment: 4-5 times a week, 3-4 at a time, has cut back recently   Drug use: Yes    Types: Marijuana    Comment: routine use of marijuana, occasional mushrooms   Sexual activity: Not on file  Other Topics Concern   Not on file  Social History Narrative   Not on file   Social Determinants of Health   Financial Resource Strain: Not on file  Food Insecurity: No Food Insecurity (02/28/2022)   Hunger Vital Sign     Worried About Running Out of Food in the Last Year: Never true    Ran Out of Food in the Last Year: Never true  Transportation Needs: Not on file  Physical Activity: Not on file  Stress: Not on file  Social Connections: Not on file  Intimate Partner Violence: Not on file    Family History:    Family History  Problem Relation Age of Onset   Lung cancer Maternal Grandfather    Other Sister        Diabetes Insipidus   Rectal cancer Sister    Mitral valve prolapse Father    Ulcers Sister        peptic   Emphysema Paternal Grandfather    Lung cancer Paternal Grandfather    Stroke Neg Hx    Heart disease Neg Hx  Colon cancer Neg Hx    Colon polyps Neg Hx    Esophageal cancer Neg Hx    Stomach cancer Neg Hx      ROS:  Please see the history of present illness.   All other ROS reviewed and negative.     Physical Exam/Data:   Vitals:   02/28/22 0800 02/28/22 0842 02/28/22 0900 02/28/22 1525  BP: 117/89 112/89 115/77 128/87  Pulse: 62 94 88 98  Resp: 20 18 20 18   Temp:  97.6 F (36.4 C) 98.2 F (36.8 C) 97.8 F (36.6 C)  TempSrc:  Oral Oral Oral  SpO2: 97% 92% 93% 95%  Weight:   91.1 kg   Height:   6\' 1"  (1.854 m)     Intake/Output Summary (Last 24 hours) at 02/28/2022 1626 Last data filed at 02/28/2022 1414 Gross per 24 hour  Intake 240 ml  Output 1000 ml  Net -760 ml      02/28/2022    9:00 AM 02/27/2022   11:00 PM 02/24/2022    1:32 PM  Last 3 Weights  Weight (lbs) 200 lb 13.4 oz 206 lb 5.6 oz 206 lb 4 oz  Weight (kg) 91.1 kg 93.6 kg 93.554 kg     VS:  BP 128/87   Pulse 98   Temp 97.8 F (36.6 C) (Oral)   Resp 18   Ht 6\' 1"  (1.854 m)   Wt 91.1 kg   SpO2 95%   BMI 26.50 kg/m  , BMI Body mass index is 26.5 kg/m. GENERAL:  Well appearing HEENT: Pupils equal round and reactive, fundi not visualized, oral mucosa unremarkable NECK:  No jugular venous distention, waveform within normal limits, carotid upstroke brisk and symmetric, no bruits, no  thyromegaly LUNGS:  Expiratory wheezing HEART:  Irregularly irregular.  PMI not displaced or sustained,S1 and S2 within normal limits, no S3, no S4, no clicks, no rubs, no murmurs ABD:  Flat, positive bowel sounds normal in frequency in pitch, no bruits, no rebound, no guarding, no midline pulsatile mass, no hepatomegaly, no splenomegaly EXT:  2 plus pulses throughout, no edema, no cyanosis no clubbing SKIN:  No rashes no nodules NEURO:  Cranial nerves II through XII grossly intact, motor grossly intact throughout PSYCH:  Cognitively intact, oriented to person place and time   EKG:  The EKG was personally reviewed and demonstrates: Atrial fibrillation.  Rate 147 bpm.  PVCs. Telemetry:  Telemetry was personally reviewed and demonstrates: Atrial fibrillation.  Rate 80s to 120s  Relevant CV Studies:  Echo 02/28/22:  1. Left ventricular ejection fraction, by estimation, is 40 to 45%. The  left ventricle has mildly decreased function. The left ventricle  demonstrates global hypokinesis. Left ventricular diastolic function could  not be evaluated.   2. Right ventricular systolic function is mildly reduced. The right  ventricular size is normal. There is normal pulmonary artery systolic  pressure.   3. Left atrial size was mildly dilated.   4. Right atrial size was mildly dilated.   5. The mitral valve is normal in structure. No evidence of mitral valve  regurgitation. No evidence of mitral stenosis.   6. The aortic valve is normal in structure. Aortic valve regurgitation is  not visualized. No aortic stenosis is present.   7. The inferior vena cava is dilated in size with >50% respiratory  variability, suggesting right atrial pressure of 8 mmHg.    Laboratory Data:  High Sensitivity Troponin:  No results for input(s): "TROPONINIHS" in the last  720 hours.   Chemistry Recent Labs  Lab 02/27/22 2148  NA 140  K 3.8  CL 102  CO2 28  GLUCOSE 94  BUN 15  CREATININE 1.05  CALCIUM 9.2   MG 2.1  GFRNONAA >60  ANIONGAP 10    No results for input(s): "PROT", "ALBUMIN", "AST", "ALT", "ALKPHOS", "BILITOT" in the last 168 hours. Lipids No results for input(s): "CHOL", "TRIG", "HDL", "LABVLDL", "LDLCALC", "CHOLHDL" in the last 168 hours.  Hematology Recent Labs  Lab 02/27/22 2148 02/28/22 1240  WBC 4.7 6.4  RBC 5.11 5.05  HGB 17.3* 17.0  HCT 49.0 48.2  MCV 95.9 95.4  MCH 33.9 33.7  MCHC 35.3 35.3  RDW 12.3 12.2  PLT 134* 151   Thyroid  Recent Labs  Lab 02/28/22 1241  TSH 0.276*    BNP Recent Labs  Lab 02/27/22 2148  BNP 273.5*    DDimer No results for input(s): "DDIMER" in the last 168 hours.   Radiology/Studies:  ECHOCARDIOGRAM COMPLETE  Result Date: 02/28/2022    ECHOCARDIOGRAM REPORT   Patient Name:   DARRENCE SIEGMAN Date of Exam: 02/28/2022 Medical Rec #:  [id number]          Height:       73.0 in Accession #:    [id number]         Weight:       200.8 lb Date of Birth:  Nov 25, 1970          BSA:          2.155 m Patient Age:    4 years           BP:           115/77 mmHg Patient Gender: M                  HR:           94 bpm. Exam Location:  Inpatient Procedure: 2D Echo Indications:    atrial fibrillation  History:        Patient has no prior history of Echocardiogram examinations.                 Arrythmias:Atrial Fibrillation; Risk Factors:Dyslipidemia.  Sonographer:    Johny Chess RDCS Referring Phys: [place]  1. Left ventricular ejection fraction, by estimation, is 40 to 45%. The left ventricle has mildly decreased function. The left ventricle demonstrates global hypokinesis. Left ventricular diastolic function could not be evaluated.  2. Right ventricular systolic function is mildly reduced. The right ventricular size is normal. There is normal pulmonary artery systolic pressure.  3. Left atrial size was mildly dilated.  4. Right atrial size was mildly dilated.  5. The mitral valve is normal in structure. No evidence of  mitral valve regurgitation. No evidence of mitral stenosis.  6. The aortic valve is normal in structure. Aortic valve regurgitation is not visualized. No aortic stenosis is present.  7. The inferior vena cava is dilated in size with >50% respiratory variability, suggesting right atrial pressure of 8 mmHg. FINDINGS  Left Ventricle: Left ventricular ejection fraction, by estimation, is 40 to 45%. The left ventricle has mildly decreased function. The left ventricle demonstrates global hypokinesis. The left ventricular internal cavity size was normal in size. There is  no left ventricular hypertrophy. Left ventricular diastolic function could not be evaluated due to atrial fibrillation. Left ventricular diastolic function could not be evaluated. Right Ventricle: The right ventricular size is normal. No increase in  right ventricular wall thickness. Right ventricular systolic function is mildly reduced. There is normal pulmonary artery systolic pressure. The tricuspid regurgitant velocity is 1.86 m/s, and with an assumed right atrial pressure of 8 mmHg, the estimated right ventricular systolic pressure is 123XX123 mmHg. Left Atrium: Left atrial size was mildly dilated. Right Atrium: Right atrial size was mildly dilated. Pericardium: There is no evidence of pericardial effusion. Mitral Valve: The mitral valve is normal in structure. No evidence of mitral valve regurgitation. No evidence of mitral valve stenosis. Tricuspid Valve: The tricuspid valve is normal in structure. Tricuspid valve regurgitation is not demonstrated. No evidence of tricuspid stenosis. Aortic Valve: The aortic valve is normal in structure. Aortic valve regurgitation is not visualized. No aortic stenosis is present. Pulmonic Valve: The pulmonic valve was normal in structure. Pulmonic valve regurgitation is not visualized. No evidence of pulmonic stenosis. Aorta: The aortic root is normal in size and structure. Venous: The inferior vena cava is dilated in  size with greater than 50% respiratory variability, suggesting right atrial pressure of 8 mmHg. IAS/Shunts: No atrial level shunt detected by color flow Doppler.  LEFT VENTRICLE PLAX 2D LVOT diam:     2.30 cm LVOT Area:     4.15 cm  RIGHT VENTRICLE             IVC RV S prime:     10.90 cm/s  IVC diam: 2.30 cm TAPSE (M-mode): 1.6 cm LEFT ATRIUM             Index        RIGHT ATRIUM           Index LA Vol (A2C):   80.6 ml 37.39 ml/m  RA Area:     22.90 cm LA Vol (A4C):   49.8 ml 23.10 ml/m  RA Volume:   70.70 ml  32.80 ml/m LA Biplane Vol: 65.7 ml 30.48 ml/m   AORTA Ao Root diam: 3.50 cm Ao Asc diam:  3.10 cm TRICUSPID VALVE TR Peak grad:   13.8 mmHg TR Vmax:        186.00 cm/s  SHUNTS Systemic Diam: 2.30 cm Skeet Latch MD Electronically signed by Skeet Latch MD Signature Date/Time: 02/28/2022/3:58:34 PM    Final    CT Chest Wo Contrast  Result Date: 02/27/2022 CLINICAL DATA:  Pneumonia, complication suspected.  RSV positive. EXAM: CT CHEST WITHOUT CONTRAST TECHNIQUE: Multidetector CT imaging of the chest was performed following the standard protocol without IV contrast. RADIATION DOSE REDUCTION: This exam was performed according to the departmental dose-optimization program which includes automated exposure control, adjustment of the mA and/or kV according to patient size and/or use of iterative reconstruction technique. COMPARISON:  02/27/2022, 04/05/2021. FINDINGS: Cardiovascular: The heart is normal in size and there is no pericardial effusion. The aorta and pulmonary trunk are normal in caliber. Mediastinum/Nodes: Prominent lymph nodes are noted in the mediastinum measuring up to 8 mm in the precarinal space. A 1.3 cm nodule is noted in the subcarinal space. Mildly enlarged lymph node is present at the right hilum measuring 1.1 cm. No axillary lymphadenopathy. The thyroid gland, trachea, and esophagus are within normal limits. Lungs/Pleura: The central airways are patent. Pleural and  parenchymal biapical pleural scarring is noted. Scattered tree-in-bud nodular densities are seen in the lungs bilaterally and most pronounced in the left lower lobe. No consolidation, effusion, or pneumothorax. There is a stable 4 mm subpleural nodule in the left lower lobe, axial image 122. Upper Abdomen: No acute abnormality. Musculoskeletal: Bilateral  gynecomastia. Degenerative changes in the thoracic spine. No acute osseous abnormality. IMPRESSION: 1. Scattered tree-in-bud nodular densities in the lungs bilaterally, most pronounced in the left lower lobe and likely infectious or inflammatory. No follow-up needed if patient is low-risk (and has no known or suspected primary neoplasm). Non-contrast chest CT can be considered in 12 months if patient is high-risk. This recommendation follows the consensus statement: Guidelines for Management of Incidental Pulmonary Nodules Detected on CT Images: From the Fleischner Society 2017; Radiology 2017; 284:228-243. 2. Mediastinal and right hilar lymphadenopathy, likely reactive. 3. Stable subpleural 4 mm left lower lobe pulmonary nodule. No follow-up needed if patient is low-risk.This recommendation follows the consensus statement: Guidelines for Management of Incidental Pulmonary Nodules Detected on CT Images: From the Fleischner Society 2017; Radiology 2017; 284:228-243. Electronically Signed   By: Brett Fairy M.D.   On: 02/27/2022 23:36   DG Chest Portable 1 View  Result Date: 02/27/2022 CLINICAL DATA:  Dyspnea EXAM: PORTABLE CHEST 1 VIEW COMPARISON:  None Available. FINDINGS: Mild left basilar atelectasis or infiltrate. Lungs are otherwise clear. No pneumothorax or pleural effusion. Cardiac size within normal limits. Pulmonary vascularity is normal. No acute bone abnormality. IMPRESSION: 1. Mild left basilar atelectasis or infiltrate. Electronically Signed   By: Fidela Salisbury M.D.   On: 02/27/2022 22:17     Assessment and Plan:   # PAF: Jacob Buchanan has  atrial fibrillation with RVR in the setting of RSV infection.  He does have a remote history of atrial fibrillation and has not been chronically anticoagulated.  Rates remain uncontrolled.  Echo demonstrates reduced LVEF.  Therefore would like to get him off of diltiazem.  I also think that he would benefit from restoration of sinus rhythm.  We discussed the fact that while he is still actively coughing and struggling with RSV, this probably is not the best time.  Hopefully he will convert to sinus rhythm on his own.  However, would plan for outpatient evaluation and TEE/DCCV if he remains in A-fib within the next week or 2.  Will transition from heparin to Eliquis.  Transition from diltiazem to metoprolol.  TSH was mildly low.  Repeat T3, free T4, and TSH in a.m.  # Acute systolic and diastolic HF: LVEF 40 to [id number] this admission.  He notes some exercise intolerance but no other heart failure symptoms prior to admission.  I suspect this is related to his RSV infection and possibly from A-fib.  Plan to restore sinus rhythm as above.  He did have a coronary calcium score in January which was 0.  Given the global dysfunction this is unlikely to be ischemic heart disease.  Consider stress testing versus coronary CTA if he continues to have reduced systolic function as an outpatient.  Starting metoprolol as above and will consolidate to succinate prior to discharge.  Will try to start an ARB/Entresto if blood pressure allows once heart rate is controlled.  Risk Assessment/Risk Scores:        New York Heart Association (NYHA) Functional Class NYHA Class II  CHA2DS2-VASc Score = 1   This indicates a 0.6% annual risk of stroke. The patient's score is based upon: CHF History: 1 HTN History: 0 Diabetes History: 0 Stroke History: 0 Vascular Disease History: 0 Age Score: 0 Gender Score: 0          For questions or updates, please contact [place] Please consult [email or web address] for contact  info under    Signed, Skeet Latch, MD  02/28/2022  4:26 PM

## 2022-02-28 NOTE — Plan of Care (Addendum)
Titration initiated on Cardizem drip post administration of Metoprolol.  Education provided to PT about IV medications and plant to d/c Heparin with Eloquis first dose tonight. PT independent in room. No s/s distress. PRN Tesslon Pearls given with good effect.  HR 80s-90s with occassional jump to low 100s and return to less than 100 after a few beats.   PT denies Afib symptoms. Continues to have productive cough.  Problem: Education: Goal: Knowledge of General Education information will improve Description: Including pain rating scale, medication(s)/side effects and non-pharmacologic comfort measures Outcome: Progressing   Problem: Health Behavior/Discharge Planning: Goal: Ability to manage health-related needs will improve Outcome: Progressing   Problem: Clinical Measurements: Goal: Ability to maintain clinical measurements within normal limits will improve Outcome: Progressing Goal: Will remain free from infection Outcome: Progressing Goal: Diagnostic test results will improve Outcome: Progressing Goal: Respiratory complications will improve Outcome: Progressing Goal: Cardiovascular complication will be avoided Outcome: Progressing   Problem: Activity: Goal: Risk for activity intolerance will decrease Outcome: Progressing   Problem: Nutrition: Goal: Adequate nutrition will be maintained Outcome: Progressing   Problem: Coping: Goal: Level of anxiety will decrease Outcome: Progressing   Problem: Elimination: Goal: Will not experience complications related to bowel motility Outcome: Progressing Goal: Will not experience complications related to urinary retention Outcome: Progressing   Problem: Pain Managment: Goal: General experience of comfort will improve Outcome: Progressing   Problem: Safety: Goal: Ability to remain free from injury will improve Outcome: Progressing   Problem: Skin Integrity: Goal: Risk for impaired skin integrity will decrease Outcome:  Progressing   Problem: Education: Goal: Knowledge of disease or condition will improve Outcome: Progressing Goal: Understanding of medication regimen will improve Outcome: Progressing Goal: Individualized Educational Video(s) Outcome: Progressing   Problem: Activity: Goal: Ability to tolerate increased activity will improve Outcome: Progressing   Problem: Cardiac: Goal: Ability to achieve and maintain adequate cardiopulmonary perfusion will improve Outcome: Progressing   Problem: Health Behavior/Discharge Planning: Goal: Ability to safely manage health-related needs after discharge will improve Outcome: Progressing

## 2022-02-28 NOTE — ED Notes (Signed)
Pt with persistent nonprod cough requesting cough suppressant; Dr. Bernette Mayers made aware

## 2022-03-01 ENCOUNTER — Other Ambulatory Visit (HOSPITAL_COMMUNITY): Payer: Self-pay

## 2022-03-01 ENCOUNTER — Encounter (HOSPITAL_COMMUNITY): Payer: Self-pay | Admitting: Internal Medicine

## 2022-03-01 DIAGNOSIS — B009 Herpesviral infection, unspecified: Secondary | ICD-10-CM | POA: Diagnosis not present

## 2022-03-01 DIAGNOSIS — I5041 Acute combined systolic (congestive) and diastolic (congestive) heart failure: Secondary | ICD-10-CM | POA: Diagnosis not present

## 2022-03-01 DIAGNOSIS — E0590 Thyrotoxicosis, unspecified without thyrotoxic crisis or storm: Secondary | ICD-10-CM | POA: Diagnosis not present

## 2022-03-01 DIAGNOSIS — J121 Respiratory syncytial virus pneumonia: Secondary | ICD-10-CM | POA: Diagnosis not present

## 2022-03-01 DIAGNOSIS — Z7952 Long term (current) use of systemic steroids: Secondary | ICD-10-CM | POA: Diagnosis not present

## 2022-03-01 DIAGNOSIS — Z1152 Encounter for screening for COVID-19: Secondary | ICD-10-CM | POA: Diagnosis not present

## 2022-03-01 DIAGNOSIS — J210 Acute bronchiolitis due to respiratory syncytial virus: Secondary | ICD-10-CM | POA: Diagnosis not present

## 2022-03-01 DIAGNOSIS — R0602 Shortness of breath: Secondary | ICD-10-CM | POA: Diagnosis not present

## 2022-03-01 DIAGNOSIS — I480 Paroxysmal atrial fibrillation: Secondary | ICD-10-CM | POA: Diagnosis not present

## 2022-03-01 DIAGNOSIS — Z79899 Other long term (current) drug therapy: Secondary | ICD-10-CM | POA: Diagnosis not present

## 2022-03-01 LAB — CBC
HCT: 47.9 % (ref 39.0–52.0)
Hemoglobin: 16.5 g/dL (ref 13.0–17.0)
MCH: 33.4 pg (ref 26.0–34.0)
MCHC: 34.4 g/dL (ref 30.0–36.0)
MCV: 97 fL (ref 80.0–100.0)
Platelets: 167 10*3/uL (ref 150–400)
RBC: 4.94 MIL/uL (ref 4.22–5.81)
RDW: 12.4 % (ref 11.5–15.5)
WBC: 12.7 10*3/uL — ABNORMAL HIGH (ref 4.0–10.5)
nRBC: 0 % (ref 0.0–0.2)

## 2022-03-01 LAB — BASIC METABOLIC PANEL
Anion gap: 10 (ref 5–15)
Anion gap: 10 (ref 5–15)
BUN: 32 mg/dL — ABNORMAL HIGH (ref 6–20)
BUN: 37 mg/dL — ABNORMAL HIGH (ref 6–20)
CO2: 24 mmol/L (ref 22–32)
CO2: 25 mmol/L (ref 22–32)
Calcium: 9.1 mg/dL (ref 8.9–10.3)
Calcium: 9 mg/dL (ref 8.9–10.3)
Chloride: 101 mmol/L (ref 98–111)
Chloride: 101 mmol/L (ref 98–111)
Creatinine, Ser: 1.41 mg/dL — ABNORMAL HIGH (ref 0.61–1.24)
Creatinine, Ser: 1.5 mg/dL — ABNORMAL HIGH (ref 0.61–1.24)
GFR, Estimated: 60 mL/min — ABNORMAL LOW (ref >60)
GFR, Estimated: 55 mL/min — ABNORMAL LOW (ref >60)
Glucose, Bld: 132 mg/dL — ABNORMAL HIGH (ref 70–99)
Glucose, Bld: 164 mg/dL — ABNORMAL HIGH (ref 70–99)
Potassium: 4.2 mmol/L (ref 3.5–5.1)
Potassium: 4.3 mmol/L (ref 3.5–5.1)
Sodium: 135 mmol/L (ref 135–145)
Sodium: 136 mmol/L (ref 135–145)

## 2022-03-01 LAB — TSH: TSH: 0.543 u[IU]/mL (ref 0.350–4.500)

## 2022-03-01 LAB — T4, FREE: Free T4: 0.91 ng/dL (ref 0.61–1.12)

## 2022-03-01 MED ORDER — METOPROLOL SUCCINATE ER 100 MG PO TB24
100.0000 mg | ORAL_TABLET | Freq: Every day | ORAL | 11 refills | Status: DC
  Filled 2022-03-01: qty 30, 30d supply, fill #0

## 2022-03-01 MED ORDER — SODIUM CHLORIDE 0.9 % IV SOLN: INTRAVENOUS | Status: AC

## 2022-03-01 MED ORDER — APIXABAN 5 MG PO TABS
5.0000 mg | ORAL_TABLET | Freq: Two times a day (BID) | ORAL | 1 refills | Status: DC
  Filled 2022-03-01: qty 60, 30d supply, fill #0

## 2022-03-01 MED ORDER — PREDNISONE 20 MG PO TABS: 40.0000 mg | ORAL_TABLET | Freq: Every day | ORAL | Status: DC

## 2022-03-01 MED ORDER — DAPAGLIFLOZIN PROPANEDIOL 10 MG PO TABS
10.0000 mg | ORAL_TABLET | Freq: Every day | ORAL | 0 refills | Status: DC
  Filled 2022-03-01: qty 30, 30d supply, fill #0

## 2022-03-01 MED ORDER — METHYLPREDNISOLONE SODIUM SUCC 125 MG IJ SOLR
125.0000 mg | Freq: Once | INTRAMUSCULAR | Status: AC
  Administered 2022-03-01: 125 mg via INTRAVENOUS
  Filled 2022-03-01: qty 2

## 2022-03-01 MED ORDER — ASCORBIC ACID 500 MG PO TABS
500.0000 mg | ORAL_TABLET | Freq: Every day | ORAL | 0 refills | Status: AC
  Filled 2022-03-01: qty 5, 5d supply, fill #0

## 2022-03-01 MED ORDER — DAPAGLIFLOZIN PROPANEDIOL 10 MG PO TABS
10.0000 mg | ORAL_TABLET | Freq: Every day | ORAL | Status: DC
  Administered 2022-03-01: 10 mg via ORAL
  Filled 2022-03-01: qty 1

## 2022-03-01 MED ORDER — GUAIFENESIN-DM 100-10 MG/5ML PO SYRP
10.0000 mL | ORAL_SOLUTION | Freq: Three times a day (TID) | ORAL | Status: DC
  Administered 2022-03-01: 10 mL via ORAL
  Filled 2022-03-01: qty 10

## 2022-03-01 MED ORDER — BENZONATATE 200 MG PO CAPS
200.0000 mg | ORAL_CAPSULE | Freq: Three times a day (TID) | ORAL | 0 refills | Status: DC
  Filled 2022-03-01: qty 20, 7d supply, fill #0

## 2022-03-01 MED ORDER — FLUTICASONE-SALMETEROL 45-21 MCG/ACT IN AERO
2.0000 | INHALATION_SPRAY | Freq: Two times a day (BID) | RESPIRATORY_TRACT | 12 refills | Status: DC
  Filled 2022-03-01: qty 12, 30d supply, fill #0

## 2022-03-01 MED ORDER — VITAMIN C 500 MG PO TABS
500.0000 mg | ORAL_TABLET | Freq: Every day | ORAL | Status: DC
  Administered 2022-03-01: 500 mg via ORAL
  Filled 2022-03-01: qty 1

## 2022-03-01 MED ORDER — ACETAMINOPHEN 325 MG PO TABS
650.0000 mg | ORAL_TABLET | Freq: Four times a day (QID) | ORAL | 0 refills | Status: DC | PRN
  Filled 2022-03-01: qty 30, 4d supply, fill #0

## 2022-03-01 MED ORDER — PREDNISONE 20 MG PO TABS
40.0000 mg | ORAL_TABLET | Freq: Every day | ORAL | 0 refills | Status: AC
  Filled 2022-03-01: qty 6, 3d supply, fill #0

## 2022-03-01 MED ORDER — METOPROLOL TARTRATE 25 MG PO TABS
25.0000 mg | ORAL_TABLET | Freq: Three times a day (TID) | ORAL | 0 refills | Status: DC
  Filled 2022-03-01: qty 90, 30d supply, fill #0

## 2022-03-01 MED ORDER — BENZONATATE 100 MG PO CAPS
200.0000 mg | ORAL_CAPSULE | Freq: Three times a day (TID) | ORAL | Status: DC
  Administered 2022-03-01: 200 mg via ORAL
  Filled 2022-03-01: qty 2

## 2022-03-01 MED ORDER — GUAIFENESIN-DM 100-10 MG/5ML PO SYRP
10.0000 mL | ORAL_SOLUTION | Freq: Three times a day (TID) | ORAL | 0 refills | Status: DC
  Filled 2022-03-01: qty 118, 4d supply, fill #0

## 2022-03-01 MED ORDER — ZINC SULFATE 220 (50 ZN) MG PO CAPS
220.0000 mg | ORAL_CAPSULE | Freq: Every day | ORAL | Status: DC
  Administered 2022-03-01: 220 mg via ORAL
  Filled 2022-03-01: qty 1

## 2022-03-01 NOTE — Hospital Course (Addendum)
Jacob Buchanan is a 51 y.o. male with medical history significant of GERD and afib presenting with cough, SOB.   He reports that he started having a cough 6 days ago.  He had a fever the first couple of days.  He tried symptomatic treatment but it got worse.  He went to the PCP office and was negative for COVID/flu.  Fever had resolved.  He was given medications for cough.  He felt worse 2 days and then yesterday he didn't take anything medications since nothing was helping.  Towards the end of the day, he had a feeling that he has had for many months like he was out of shape, couldn't breathe, chest pressure.  He decided to come to the ER.  He can feel a fluttering when it goes up and down.  This has been present before his current infection.  He has been told he has had afib for over 20 years.  No LE edema.  Cough is productive of some mucus.   ER Course:  [facility] transfer, per Dr. Thomes Dinning:  Paroxysmal atrial fibrillation with RVR , RSV, Elevated BNP rule out CHF   5-day onset of cough and shortness of breath.  Fever in the first 2 days, resolved.  Saw PCP 3 days ago, negative for flu and COVID-19.  Tessalon Perles and cough medicine given did not improve symptoms. Persistent cough, increased work of breathing, chest congestion and rhinorrhea.  RSV positive. Patient was noted to be in A-fib with RVR and was started on diltiazem drip and heparin drip, IV Lasix was given due to elevated BNP with suspicion for possible CHF.    Assessment and Plan: Principal Problem:   Paroxysmal atrial fibrillation with RVR (HCC) Active Problems:   GERD (gastroesophageal reflux disease)   Recurrent herpes simplex   RSV (respiratory syncytial virus pneumonia)     Afib with RVR -Patient presenting with new-onset afib - patient reports having had it for "20 years" but review of prior EKGs does not indicate prior history  -Etiology is thought to be related to RSV infection  -Since the afib onset is  unknown, will focus on rate control and searching for the underlying cause at this time.  -Will plan to place in observation status in progressive care for Diltiazem drip as per protocol with plan to transition to PO Diltiazem once heart rate is controlled (resting HR 110 or lower).   -Echocardiogram>>  -Consulted cardiology >>>  -Heart rate is well controlled at this time.  -CHA2DS2-VASc Score is <2 and so patient can likely be on ASA monotherapy; however, he was started on heparin drip at [facility] and will continue pending cardiology input. -There was some concern about CHF at [facility] but I do not see evidence of this currently; echo pending.   RSV -Supportive care -Anticipate resolution of tachycardia and improvement in symptoms as RSV improves  -?infiltrate on CXR but appears to be viral in nature at this time   GERD -Continue omeprazole (pantoprazole per formulary)   HSV -Takes prn Valacyclovir

## 2022-03-01 NOTE — Plan of Care (Signed)

## 2022-03-01 NOTE — TOC Transition Note (Signed)
Transition of Care [facility]) - CM/SW Discharge Note   Patient Details  Name: Jacob Buchanan MRN: [id number] Date of Birth: Jul 24, 1970  [facility] CM/SW Contact:  Tom-Johnson, Hershal Coria, RN Phone Number: 03/01/2022, 1:12 PM   Clinical Narrative:     Patient is scheduled for discharge today. CM consulted for Medication Assistance for Eliquis. Benefit check done and noted. Prescription will be sent to [facility] pharmacy and coupon given for copay for first 30 days. CM spoke with patient about Patient's assistance program. Online resources given to patient at bedside and patient states he will apply for assistance to help with copay.  Girlfriend to transport at discharge. No further TOC needs noted.       Final next level of care: Home/Self Care Barriers to Discharge: Barriers Resolved   Patient Goals and CMS Choice CMS Medicare.gov Compare Post Acute Care list provided to:: Patient Choice offered to / list presented to : NA  Discharge Placement                  Patient to be transferred to facility by: Family      Discharge Plan and Services Additional resources added to the After Visit Summary for                  DME Arranged: N/A DME Agency: NA       HH Arranged: NA HH Agency: NA        Social Determinants of Health (SDOH) Interventions SDOH Screenings   Food Insecurity: No Food Insecurity (02/28/2022)  Housing: Low Risk  (03/01/2022)  Transportation Needs: No Transportation Needs (03/01/2022)  Alcohol Screen: Low Risk  (03/01/2022)  Depression (PHQ2-9): Low Risk  (10/26/2021)  Financial Resource Strain: Low Risk  (03/01/2022)  Tobacco Use: Low Risk  (03/01/2022)     Readmission Risk Interventions     No data to display

## 2022-03-01 NOTE — Progress Notes (Addendum)
Rounding Note    Patient Name: Jacob Buchanan Date of Encounter: 03/01/2022  [place] Cardiologist: None   Subjective   Patient sitting in bedside chair this morning. Reports lingering cough but overall is feeling much better today with improved breathing. Denies chest pain, palpitations, dizziness/lightheadedness. He tells me that he understands he will be discharged home today. Discuss plans for afib clinic follow up.  Inpatient Medications    Scheduled Meds:  apixaban  5 mg Oral BID   ascorbic acid  500 mg Oral Daily   benzonatate  200 mg Oral TID   guaiFENesin-dextromethorphan  10 mL Oral Q8H   metoprolol tartrate  25 mg Oral Q8H   pantoprazole  40 mg Oral Daily   [START ON 03/02/2022] predniSONE  40 mg Oral Q breakfast   psyllium  1 packet Oral Daily   zinc sulfate  220 mg Oral Daily   Continuous Infusions:  sodium chloride 50 mL/hr at 03/01/22 0821   diltiazem (CARDIZEM) infusion 10 mg/hr (03/01/22 0322)   PRN Meds: acetaminophen **OR** acetaminophen, hydrALAZINE, ondansetron **OR** ondansetron (ZOFRAN) IV   Vital Signs    Vitals:   02/28/22 2040 03/01/22 0020 03/01/22 0139 03/01/22 0538  BP: (!) 115/90 (!) 108/94  (!) 118/91  Pulse: 72   72  Resp: 18 18  18   Temp: (!) 97.3 F (36.3 C) (!) 97.5 F (36.4 C)  (!) 97.3 F (36.3 C)  TempSrc: Oral Oral  Oral  SpO2: 96% 96%  96%  Weight:   90.9 kg   Height:        Intake/Output Summary (Last 24 hours) at 03/01/2022 1009 Last data filed at 03/01/2022 0322 Gross per 24 hour  Intake 1052.13 ml  Output --  Net 1052.13 ml      03/01/2022    1:39 AM 02/28/2022    9:00 AM 02/27/2022   11:00 PM  Last 3 Weights  Weight (lbs) 200 lb 6.4 oz 200 lb 13.4 oz 206 lb 5.6 oz  Weight (kg) 90.9 kg 91.1 kg 93.6 kg      Telemetry    Atrial fibrillation, variable ventricular rates but average 70s-90s - Personally Reviewed  ECG    No new tracing  Physical Exam   GEN: No acute distress.    Neck: No JVD Cardiac: irregularly irregular, no murmurs, rubs, or gallops.  Respiratory: Clear to auscultation bilaterally. GI: Soft, nontender, non-distended  MS: No edema; No deformity. Neuro:  Nonfocal  Psych: Normal affect   Labs    High Sensitivity Troponin:  No results for input(s): "TROPONINIHS" in the last 720 hours.   Chemistry Recent Labs  Lab 02/27/22 2148 03/01/22 0101  NA 140 135  K 3.8 4.2  CL 102 101  CO2 28 24  GLUCOSE 94 132*  BUN 15 32*  CREATININE 1.05 1.41*  CALCIUM 9.2 9.1  MG 2.1  --   GFRNONAA >60 60*  ANIONGAP 10 10    Lipids No results for input(s): "CHOL", "TRIG", "HDL", "LABVLDL", "LDLCALC", "CHOLHDL" in the last 168 hours.  Hematology Recent Labs  Lab 02/27/22 2148 02/28/22 1240 03/01/22 0101  WBC 4.7 6.4 12.7*  RBC 5.11 5.05 4.94  HGB 17.3* 17.0 16.5  HCT 49.0 48.2 47.9  MCV 95.9 95.4 97.0  MCH 33.9 33.7 33.4  MCHC 35.3 35.3 34.4  RDW 12.3 12.2 12.4  PLT 134* 151 167   Thyroid  Recent Labs  Lab 03/01/22 0101  TSH 0.543  FREET4 0.91  BNP Recent Labs  Lab 02/27/22 2148  BNP 273.5*    DDimer No results for input(s): "DDIMER" in the last 168 hours.   Radiology    ECHOCARDIOGRAM COMPLETE  Result Date: 02/28/2022    ECHOCARDIOGRAM REPORT   Patient Name:   Jacob Buchanan Date of Exam: 02/28/2022 Medical Rec #:  [id number]          Height:       73.0 in Accession #:    [id number]         Weight:       200.8 lb Date of Birth:  [birth year]          BSA:          2.155 m Patient Age:    51 years           BP:           115/77 mmHg Patient Gender: M                  HR:           94 bpm. Exam Location:  Inpatient Procedure: 2D Echo Indications:    atrial fibrillation  History:        Patient has no prior history of Echocardiogram examinations.                 Arrythmias:Atrial Fibrillation; Risk Factors:Dyslipidemia.  Sonographer:    Johny Chess RDCS Referring Phys: [place]  1. Left ventricular  ejection fraction, by estimation, is 40 to 45%. The left ventricle has mildly decreased function. The left ventricle demonstrates global hypokinesis. Left ventricular diastolic function could not be evaluated.  2. Right ventricular systolic function is mildly reduced. The right ventricular size is normal. There is normal pulmonary artery systolic pressure.  3. Left atrial size was mildly dilated.  4. Right atrial size was mildly dilated.  5. The mitral valve is normal in structure. No evidence of mitral valve regurgitation. No evidence of mitral stenosis.  6. The aortic valve is normal in structure. Aortic valve regurgitation is not visualized. No aortic stenosis is present.  7. The inferior vena cava is dilated in size with >50% respiratory variability, suggesting right atrial pressure of 8 mmHg. FINDINGS  Left Ventricle: Left ventricular ejection fraction, by estimation, is 40 to 45%. The left ventricle has mildly decreased function. The left ventricle demonstrates global hypokinesis. The left ventricular internal cavity size was normal in size. There is  no left ventricular hypertrophy. Left ventricular diastolic function could not be evaluated due to atrial fibrillation. Left ventricular diastolic function could not be evaluated. Right Ventricle: The right ventricular size is normal. No increase in right ventricular wall thickness. Right ventricular systolic function is mildly reduced. There is normal pulmonary artery systolic pressure. The tricuspid regurgitant velocity is 1.86 m/s, and with an assumed right atrial pressure of 8 mmHg, the estimated right ventricular systolic pressure is 123XX123 mmHg. Left Atrium: Left atrial size was mildly dilated. Right Atrium: Right atrial size was mildly dilated. Pericardium: There is no evidence of pericardial effusion. Mitral Valve: The mitral valve is normal in structure. No evidence of mitral valve regurgitation. No evidence of mitral valve stenosis. Tricuspid Valve: The  tricuspid valve is normal in structure. Tricuspid valve regurgitation is not demonstrated. No evidence of tricuspid stenosis. Aortic Valve: The aortic valve is normal in structure. Aortic valve regurgitation is not visualized. No aortic stenosis is present. Pulmonic Valve: The pulmonic valve was normal in structure.  Pulmonic valve regurgitation is not visualized. No evidence of pulmonic stenosis. Aorta: The aortic root is normal in size and structure. Venous: The inferior vena cava is dilated in size with greater than 50% respiratory variability, suggesting right atrial pressure of 8 mmHg. IAS/Shunts: No atrial level shunt detected by color flow Doppler.  LEFT VENTRICLE PLAX 2D LVOT diam:     2.30 cm LVOT Area:     4.15 cm  RIGHT VENTRICLE             IVC RV S prime:     10.90 cm/s  IVC diam: 2.30 cm TAPSE (M-mode): 1.6 cm LEFT ATRIUM             Index        RIGHT ATRIUM           Index LA Vol (A2C):   80.6 ml 37.39 ml/m  RA Area:     22.90 cm LA Vol (A4C):   49.8 ml 23.10 ml/m  RA Volume:   70.70 ml  32.80 ml/m LA Biplane Vol: 65.7 ml 30.48 ml/m   AORTA Ao Root diam: 3.50 cm Ao Asc diam:  3.10 cm TRICUSPID VALVE TR Peak grad:   13.8 mmHg TR Vmax:        186.00 cm/s  SHUNTS Systemic Diam: 2.30 cm Skeet Latch MD Electronically signed by Skeet Latch MD Signature Date/Time: 02/28/2022/3:58:34 PM    Final    CT Chest Wo Contrast  Result Date: 02/27/2022 CLINICAL DATA:  Pneumonia, complication suspected.  RSV positive. EXAM: CT CHEST WITHOUT CONTRAST TECHNIQUE: Multidetector CT imaging of the chest was performed following the standard protocol without IV contrast. RADIATION DOSE REDUCTION: This exam was performed according to the departmental dose-optimization program which includes automated exposure control, adjustment of the mA and/or kV according to patient size and/or use of iterative reconstruction technique. COMPARISON:  02/27/2022, 04/05/2021. FINDINGS: Cardiovascular: The heart is normal in  size and there is no pericardial effusion. The aorta and pulmonary trunk are normal in caliber. Mediastinum/Nodes: Prominent lymph nodes are noted in the mediastinum measuring up to 8 mm in the precarinal space. A 1.3 cm nodule is noted in the subcarinal space. Mildly enlarged lymph node is present at the right hilum measuring 1.1 cm. No axillary lymphadenopathy. The thyroid gland, trachea, and esophagus are within normal limits. Lungs/Pleura: The central airways are patent. Pleural and parenchymal biapical pleural scarring is noted. Scattered tree-in-bud nodular densities are seen in the lungs bilaterally and most pronounced in the left lower lobe. No consolidation, effusion, or pneumothorax. There is a stable 4 mm subpleural nodule in the left lower lobe, axial image 122. Upper Abdomen: No acute abnormality. Musculoskeletal: Bilateral gynecomastia. Degenerative changes in the thoracic spine. No acute osseous abnormality. IMPRESSION: 1. Scattered tree-in-bud nodular densities in the lungs bilaterally, most pronounced in the left lower lobe and likely infectious or inflammatory. No follow-up needed if patient is low-risk (and has no known or suspected primary neoplasm). Non-contrast chest CT can be considered in 12 months if patient is high-risk. This recommendation follows the consensus statement: Guidelines for Management of Incidental Pulmonary Nodules Detected on CT Images: From the Fleischner Society 2017; Radiology 2017; 284:228-243. 2. Mediastinal and right hilar lymphadenopathy, likely reactive. 3. Stable subpleural 4 mm left lower lobe pulmonary nodule. No follow-up needed if patient is low-risk.This recommendation follows the consensus statement: Guidelines for Management of Incidental Pulmonary Nodules Detected on CT Images: From the Fleischner Society 2017; Radiology 2017; 284:228-243. Electronically Signed  By: Brett Fairy M.D.   On: 02/27/2022 23:36   DG Chest Portable 1 View  Result Date:  02/27/2022 CLINICAL DATA:  Dyspnea EXAM: PORTABLE CHEST 1 VIEW COMPARISON:  None Available. FINDINGS: Mild left basilar atelectasis or infiltrate. Lungs are otherwise clear. No pneumothorax or pleural effusion. Cardiac size within normal limits. Pulmonary vascularity is normal. No acute bone abnormality. IMPRESSION: 1. Mild left basilar atelectasis or infiltrate. Electronically Signed   By: Fidela Salisbury M.D.   On: 02/27/2022 22:17    Cardiac Studies   Echo 02/28/22:  1. Left ventricular ejection fraction, by estimation, is 40 to 45%. The  left ventricle has mildly decreased function. The left ventricle  demonstrates global hypokinesis. Left ventricular diastolic function could  not be evaluated.   2. Right ventricular systolic function is mildly reduced. The right  ventricular size is normal. There is normal pulmonary artery systolic  pressure.   3. Left atrial size was mildly dilated.   4. Right atrial size was mildly dilated.   5. The mitral valve is normal in structure. No evidence of mitral valve  regurgitation. No evidence of mitral stenosis.   6. The aortic valve is normal in structure. Aortic valve regurgitation is  not visualized. No aortic stenosis is present.   7. The inferior vena cava is dilated in size with >50% respiratory  variability, suggesting right atrial pressure of 8 mmHg.   Patient Profile     Jacob Buchanan is a 51 y.o. male with a hx of  Gilbert's disease, remote PAF, and hyperlipidemia who is being seen 02/28/2022 for the evaluation of atrial fibrillation with RVR at the request of Dr. Lorin Mercy.   Assessment & Plan     Paroxysmal atrial fibrillation  Patient with afib/RVR in the setting of acute RSV infection. He does have a remote history of afib but has not been on anticoagulation. Rates now well controlled with metoprolol.   Continue metoprolol tartrate 25mg  Q8h, can consolidate on discharge. Continue Eliquis 5mg  BID Patient would certainly benefit  from restoration of NSR. We will arrange afib clinic follow up to further discuss rhythm management strategies.  Acute systolic and diastolic HF  TTE this admission with LVEF 40-45% and global hypokinesis. Patient without significant symptoms of heart failure prior to this admission. BNP 273.5 and patient euvolemic on exam. Coronary calcium score of 0 in January 2023.   Given lack of focal HF symptoms and acute illness with afib/RVR, suspect that this is rate/stress driven, less likely ischemic. Will discuss whether HF GDMT should be initiated now vs in outpatient setting with Dr. Margaretann Loveless.     Per primary team:  RSV  For questions or updates, please contact [place] Please consult [email or web address] for contact info under        Signed, Kyshaun Foringer, PA-C  03/01/2022, 10:09 AM    Patient seen and examined with Lily Kocher PA-C.  Agree as above, with the following exceptions and changes as noted below. Ready to go home, ambulating well around the room no supp O2. Gen: NAD, CV: RRR, no murmurs, Lungs: clear, Abd: soft, Extrem: Warm, well perfused, no edema, Neuro/Psych: alert and oriented x 3, normal mood and affect. All available labs, radiology testing, previous records reviewed. Recommend outpatient afib clinic follow up when recovered from RSV. EF is reduced but no HF sx, may be stress vs rhythm related. Can titrate meds as outpatient given low normal to hypotensive BP. Close follow up recommended and needs repeat EF  assessment in 6-8 weeks.   Parke Poisson, MD 03/01/22 10:53 AM

## 2022-03-01 NOTE — TOC Benefit Eligibility Note (Addendum)
Patient Product/process development scientist completed.    The patient is currently admitted and upon discharge could be taking Eliquis 5 mg.  The current 30 day co-pay is $550.09 due to a  $5,404.68 deductible remaining.   The patient is insured through [place] [employer]   Roland Earl, CPHT Pharmacy Patient Advocate Specialist [facility] Patient Advocate Team Direct Number: [phone]  Fax: [phone]

## 2022-03-01 NOTE — Progress Notes (Addendum)
   Heart Failure Stewardship Pharmacist Progress Note   PCP: Ardith Dark, MD PCP-Cardiologist: None    HPI:  51 yo M with PMH of GERD, anemia, HLD, and afib.  He presented to the ED on 12/24 with shortness of breath and cough. Presented to his PCP 3 days prior and was negative for COVID and flu. Found to be positive for RSV and in afib RVR. Started on IV diltiazem. ECHO 12/25 showed LVEF 40-45%, RV mildly reduced. No ischemic evaluation planned at this time since recent coronary calcium score was 0.   Current HF Medications: Beta Blocker: metoprolol tartrate 25 mg q8h  Prior to admission HF Medications: None  Pertinent Lab Values: Serum creatinine 1.41, BUN 32, Potassium 4.2, Sodium 135, BNP 273.5, Magnesium 2.1, A1c 5.1   Vital Signs: Weight: 200 lbs (admission weight: 206 lbs) Blood pressure: 110/90s  Heart rate: 80s  I/O: -0.3L yesterday; net even  Medication Assistance / Insurance Benefits Check: Does the patient have prescription insurance?  Yes Type of insurance plan: Financial risk analyst  Outpatient Pharmacy:  Prior to admission outpatient pharmacy[facility] Is the patient willing to use [facility] TOC pharmacy at discharge? Yes Is the patient willing to transition their outpatient pharmacy to utilize a [facility] outpatient pharmacy?   Pending    Assessment: 1. Acute systolic CHF (LVEF 40-45%), due to presuming NICM. NYHA class II symptoms. - Does not appear volume overloaded on exam, no indication for diuretics - On metoprolol tartrate 25 mg q8h, consider consolidating to metoprolol succinate for discharge - Consider low dose losartan + spironolactone 12.5 mg daily prior to discharge vs at follow up given soft BP and creatinine bump - Consider adding Farxiga 10 mg daily   Plan: 1) Medication changes recommended at this time: - Add Farxiga 10 mg daily - Consolidate to metoprolol succinate for discharge  2) Patient assistance: - Has $5,000 deductible  remaining - Recommend using free 30 day cards for now and then can pay deductible in January  3)  Education  - To be completed prior to discharge  Sharen Hones, PharmD, BCPS Heart Failure Engineer, building services Phone [phone]

## 2022-03-01 NOTE — Discharge Summary (Signed)
Physician Discharge Summary   Patient: Jacob Buchanan MRN: [id number] DOB: [birth year]  Admit date:     02/27/2022  Discharge date: 03/01/22  Discharge Physician: Deatra James   PCP: Vivi Barrack, MD   Recommendations at discharge:  -Follow-up with cardiology within 2-4 weeks -Follow-up with PCP within 1-4 weeks Continue current recommended medication-may be modified adjusted and discontinued by cardiology as an outpatient  Discharge Diagnoses: Principal Problem:   Paroxysmal atrial fibrillation with RVR ([place]) Active Problems:   GERD (gastroesophageal reflux disease)   Recurrent herpes simplex   RSV (respiratory syncytial virus pneumonia)   RSV (acute bronchiolitis due to respiratory syncytial virus)   Acute combined systolic and diastolic (congestive) hrt fail ([place])   Hyperthyroidism  Resolved Problems:   * No resolved hospital problems. *  Hospital Course: Jacob Buchanan is a 51 y.o. male with medical history significant of GERD and afib presenting with cough, SOB.   He reports that he started having a cough 6 days ago.  He had a fever the first couple of days.  He tried symptomatic treatment but it got worse.  He went to the PCP office and was negative for COVID/flu.  Fever had resolved.  He was given medications for cough.  He felt worse 2 days and then yesterday he didn't take anything medications since nothing was helping.  Towards the end of the day, he had a feeling that he has had for many months like he was out of shape, couldn't breathe, chest pressure.  He decided to come to the ER.  He can feel a fluttering when it goes up and down.  This has been present before his current infection.  He has been told he has had afib for over 20 years.  No LE edema.  Cough is productive of some mucus.   ER Course:  [facility] transfer, per Dr. Josephine Cables:  Paroxysmal atrial fibrillation with RVR , RSV, Elevated BNP rule out CHF   5-day onset of cough and shortness of  breath.  Fever in the first 2 days, resolved.  Saw PCP 3 days ago, negative for flu and COVID-19.  Tessalon Perles and cough medicine given did not improve symptoms. Persistent cough, increased work of breathing, chest congestion and rhinorrhea.  RSV positive. Patient was noted to be in A-fib with RVR and was started on diltiazem drip and heparin drip, IV Lasix was given due to elevated BNP with suspicion for possible CHF.      Afib with RVR -Patient presenting with new-onset afib - patient reports having had it for "20 years" but review of prior EKGs does not indicate prior history  -Etiology is thought to be related to RSV infection  -Since the afib onset is unknown, will focus on rate control and searching for the underlying cause at this time.  -Was initiated on diltiazem drip but subsequently having discontinued and switched to p.o. metoprolol, heart rate has improved Cardiology also initiated patient on apixaban  -Echocardiogram>> reviewed -Consulted cardiology >>> Left ventricular ejection fraction, by estimation, is 40 to 45%. The  left ventricle has mildly decreased function. The left ventricle  demonstrates global hypokinesis. .. Valves within normal limits  -Heart rate improved  -CHA2DS2-VASc Score is <2 and so patient can likely be on ASA monotherapy; however, he was started on heparin drip at [facility] and will continue pending cardiology input. -There was some concern about CHF at [facility], no excessive lower extremity edema, congestion  Per cardiology recommendation  metoprolol Eliquis 5 mg twice daily and Farxiga  Follow-up closely with cardiology as an outpatient    RSV -Supportive care -As needed mucolytic's, -Short course of steroids   GERD -Continue omeprazole (pantoprazole per formulary)   HSV -Takes prn Valacyclovir      Consultants: Cardiology Procedures performed: 2D echocardiogram Disposition: Home Diet recommendation:  Discharge Diet Orders (From admission,  onward)     Start     Ordered   03/01/22 0000  Diet - low sodium heart healthy        03/01/22 1114           Cardiac diet DISCHARGE MEDICATION: Allergies as of 03/01/2022   No Known Allergies      Medication List     STOP taking these medications    guaiFENesin-codeine 100-10 MG/5ML syrup Commonly known as: ROBITUSSIN AC       TAKE these medications    acetaminophen 325 MG tablet Commonly known as: TYLENOL Take 2 tablets (650 mg total) by mouth every 6 (six) hours as needed for mild pain (or Fever >/= 101).   apixaban 5 MG Tabs tablet Commonly known as: ELIQUIS Take 1 tablet (5 mg total) by mouth 2 (two) times daily.   ascorbic acid 500 MG tablet Commonly known as: VITAMIN C Take 1 tablet (500 mg total) by mouth daily for 5 days. Start taking on: March 02, 2022   azelastine 0.1 % nasal spray Commonly known as: ASTELIN Place 2 sprays into both nostrils daily as needed (sinus symptoms). Use in each nostril as directed   benzonatate 200 MG capsule Commonly known as: TESSALON Take 1 capsule (200 mg total) by mouth 3 (three) times daily. What changed:  when to take this reasons to take this   dapagliflozin propanediol 10 MG Tabs tablet Commonly known as: FARXIGA Take 1 tablet (10 mg total) by mouth daily.   FERROUS SULFATE PO Take 1 tablet by mouth in the morning.   fluticasone 50 MCG/ACT nasal spray Commonly known as: FLONASE Place 2 sprays into both nostrils daily as needed (sinus symptoms).   guaiFENesin-dextromethorphan 100-10 MG/5ML syrup Commonly known as: ROBITUSSIN DM Take 10 mLs by mouth every 8 (eight) hours.   IMMUNE SUPPORT PO Take 1 tablet by mouth in the morning.   metoprolol succinate 100 MG 24 hr tablet Commonly known as: Toprol XL Take 1 tablet (100 mg total) by mouth daily. Take with or immediately following a meal.   multivitamin tablet Take 1 tablet by mouth in the morning.   omeprazole 40 MG capsule Commonly known  as: PRILOSEC TAKE ONE CAPSULE BY MOUTH TWICE A DAY What changed: when to take this   predniSONE 20 MG tablet Commonly known as: DELTASONE Take 2 tablets (40 mg total) by mouth daily with breakfast for 3 days. Start taking on: March 02, 2022   valACYclovir 1000 MG tablet Commonly known as: VALTREX TAKE ONE TABLET BY MOUTH DAILY What changed:  how much to take when to take this additional instructions   VICKS DAYQUIL/NYQUIL SEVERE PO Take 30 mLs by mouth 2 (two) times daily as needed (flu/cold symptoms).        Discharge Exam: Filed Weights   02/27/22 2300 02/28/22 0900 03/01/22 0139  Weight: 93.6 kg 91.1 kg 90.9 kg      Physical Exam:   General:  AAO x 3,  cooperative, no distress;   HEENT:  Normocephalic, PERRL, otherwise with in Normal limits   Neuro:  CNII-XII intact. , normal motor  and sensation, reflexes intact   Lungs:   Clear to auscultation BL, Respirations unlabored,  No wheezes / crackles  Cardio:    Irregularly irregular, S1/S2, RRR, No murmure, No Rubs or Gallops   Abdomen:  Soft, non-tender, bowel sounds active all four quadrants, no guarding or peritoneal signs.  Muscular  skeletal:  Limited exam -global generalized weaknesses - in bed, able to move all 4 extremities,   2+ pulses,  symmetric, No pitting edema  Skin:  Dry, warm to touch, negative for any Rashes,  Wounds: Please see nursing documentation          Condition at discharge: good  The results of significant diagnostics from this hospitalization (including imaging, microbiology, ancillary and laboratory) are listed below for reference.   Imaging Studies: ECHOCARDIOGRAM COMPLETE  Result Date: 02/28/2022    ECHOCARDIOGRAM REPORT   Patient Name:   CARLY MILOS Date of Exam: 02/28/2022 Medical Rec #:  [id number]          Height:       73.0 in Accession #:    [id number]         Weight:       200.8 lb Date of Birth:  1970-06-26          BSA:          2.155 m Patient Age:    51  years           BP:           115/77 mmHg Patient Gender: M                  HR:           94 bpm. Exam Location:  Inpatient Procedure: 2D Echo Indications:    atrial fibrillation  History:        Patient has no prior history of Echocardiogram examinations.                 Arrythmias:Atrial Fibrillation; Risk Factors:Dyslipidemia.  Sonographer:    Johny Chess RDCS Referring Phys: [place]  1. Left ventricular ejection fraction, by estimation, is 40 to 45%. The left ventricle has mildly decreased function. The left ventricle demonstrates global hypokinesis. Left ventricular diastolic function could not be evaluated.  2. Right ventricular systolic function is mildly reduced. The right ventricular size is normal. There is normal pulmonary artery systolic pressure.  3. Left atrial size was mildly dilated.  4. Right atrial size was mildly dilated.  5. The mitral valve is normal in structure. No evidence of mitral valve regurgitation. No evidence of mitral stenosis.  6. The aortic valve is normal in structure. Aortic valve regurgitation is not visualized. No aortic stenosis is present.  7. The inferior vena cava is dilated in size with >50% respiratory variability, suggesting right atrial pressure of 8 mmHg. FINDINGS  Left Ventricle: Left ventricular ejection fraction, by estimation, is 40 to 45%. The left ventricle has mildly decreased function. The left ventricle demonstrates global hypokinesis. The left ventricular internal cavity size was normal in size. There is  no left ventricular hypertrophy. Left ventricular diastolic function could not be evaluated due to atrial fibrillation. Left ventricular diastolic function could not be evaluated. Right Ventricle: The right ventricular size is normal. No increase in right ventricular wall thickness. Right ventricular systolic function is mildly reduced. There is normal pulmonary artery systolic pressure. The tricuspid regurgitant velocity is 1.86  m/s, and with an assumed right atrial pressure of 8  mmHg, the estimated right ventricular systolic pressure is 21.8 mmHg. Left Atrium: Left atrial size was mildly dilated. Right Atrium: Right atrial size was mildly dilated. Pericardium: There is no evidence of pericardial effusion. Mitral Valve: The mitral valve is normal in structure. No evidence of mitral valve regurgitation. No evidence of mitral valve stenosis. Tricuspid Valve: The tricuspid valve is normal in structure. Tricuspid valve regurgitation is not demonstrated. No evidence of tricuspid stenosis. Aortic Valve: The aortic valve is normal in structure. Aortic valve regurgitation is not visualized. No aortic stenosis is present. Pulmonic Valve: The pulmonic valve was normal in structure. Pulmonic valve regurgitation is not visualized. No evidence of pulmonic stenosis. Aorta: The aortic root is normal in size and structure. Venous: The inferior vena cava is dilated in size with greater than 50% respiratory variability, suggesting right atrial pressure of 8 mmHg. IAS/Shunts: No atrial level shunt detected by color flow Doppler.  LEFT VENTRICLE PLAX 2D LVOT diam:     2.30 cm LVOT Area:     4.15 cm  RIGHT VENTRICLE             IVC RV S prime:     10.90 cm/s  IVC diam: 2.30 cm TAPSE (M-mode): 1.6 cm LEFT ATRIUM             Index        RIGHT ATRIUM           Index LA Vol (A2C):   80.6 ml 37.39 ml/m  RA Area:     22.90 cm LA Vol (A4C):   49.8 ml 23.10 ml/m  RA Volume:   70.70 ml  32.80 ml/m LA Biplane Vol: 65.7 ml 30.48 ml/m   AORTA Ao Root diam: 3.50 cm Ao Asc diam:  3.10 cm TRICUSPID VALVE TR Peak grad:   13.8 mmHg TR Vmax:        186.00 cm/s  SHUNTS Systemic Diam: 2.30 cm Chilton Si MD Electronically signed by Chilton Si MD Signature Date/Time: 02/28/2022/3:58:34 PM    Final    CT Chest Wo Contrast  Result Date: 02/27/2022 CLINICAL DATA:  Pneumonia, complication suspected.  RSV positive. EXAM: CT CHEST WITHOUT CONTRAST TECHNIQUE:  Multidetector CT imaging of the chest was performed following the standard protocol without IV contrast. RADIATION DOSE REDUCTION: This exam was performed according to the departmental dose-optimization program which includes automated exposure control, adjustment of the mA and/or kV according to patient size and/or use of iterative reconstruction technique. COMPARISON:  02/27/2022, 04/05/2021. FINDINGS: Cardiovascular: The heart is normal in size and there is no pericardial effusion. The aorta and pulmonary trunk are normal in caliber. Mediastinum/Nodes: Prominent lymph nodes are noted in the mediastinum measuring up to 8 mm in the precarinal space. A 1.3 cm nodule is noted in the subcarinal space. Mildly enlarged lymph node is present at the right hilum measuring 1.1 cm. No axillary lymphadenopathy. The thyroid gland, trachea, and esophagus are within normal limits. Lungs/Pleura: The central airways are patent. Pleural and parenchymal biapical pleural scarring is noted. Scattered tree-in-bud nodular densities are seen in the lungs bilaterally and most pronounced in the left lower lobe. No consolidation, effusion, or pneumothorax. There is a stable 4 mm subpleural nodule in the left lower lobe, axial image 122. Upper Abdomen: No acute abnormality. Musculoskeletal: Bilateral gynecomastia. Degenerative changes in the thoracic spine. No acute osseous abnormality. IMPRESSION: 1. Scattered tree-in-bud nodular densities in the lungs bilaterally, most pronounced in the left lower lobe and likely infectious or inflammatory. No follow-up  needed if patient is low-risk (and has no known or suspected primary neoplasm). Non-contrast chest CT can be considered in 12 months if patient is high-risk. This recommendation follows the consensus statement: Guidelines for Management of Incidental Pulmonary Nodules Detected on CT Images: From the Fleischner Society 2017; Radiology 2017; 284:228-243. 2. Mediastinal and right hilar  lymphadenopathy, likely reactive. 3. Stable subpleural 4 mm left lower lobe pulmonary nodule. No follow-up needed if patient is low-risk.This recommendation follows the consensus statement: Guidelines for Management of Incidental Pulmonary Nodules Detected on CT Images: From the Fleischner Society 2017; Radiology 2017; 284:228-243. Electronically Signed   By: Brett Fairy M.D.   On: 02/27/2022 23:36   DG Chest Portable 1 View  Result Date: 02/27/2022 CLINICAL DATA:  Dyspnea EXAM: PORTABLE CHEST 1 VIEW COMPARISON:  None Available. FINDINGS: Mild left basilar atelectasis or infiltrate. Lungs are otherwise clear. No pneumothorax or pleural effusion. Cardiac size within normal limits. Pulmonary vascularity is normal. No acute bone abnormality. IMPRESSION: 1. Mild left basilar atelectasis or infiltrate. Electronically Signed   By: Fidela Salisbury M.D.   On: 02/27/2022 22:17    Microbiology: Results for orders placed or performed during the hospital encounter of 02/27/22  Resp panel by RT-PCR (RSV, Flu A&B, Covid) Anterior Nasal Swab     Status: Abnormal   Collection Time: 02/27/22  5:46 PM   Specimen: Anterior Nasal Swab  Result Value Ref Range Status   SARS Coronavirus 2 by RT PCR NEGATIVE NEGATIVE Final    Comment: (NOTE) SARS-CoV-2 target nucleic acids are NOT DETECTED.  The SARS-CoV-2 RNA is generally detectable in upper respiratory specimens during the acute phase of infection. The lowest concentration of SARS-CoV-2 viral copies this assay can detect is 138 copies/mL. A negative result does not preclude SARS-Cov-2 infection and should not be used as the sole basis for treatment or other patient management decisions. A negative result may occur with  improper specimen collection/handling, submission of specimen other than nasopharyngeal swab, presence of viral mutation(s) within the areas targeted by this assay, and inadequate number of viral copies(<138 copies/mL). A negative result must be  combined with clinical observations, patient history, and epidemiological information. The expected result is Negative.  Fact Sheet for Patients:  EntrepreneurPulse.com.au  Fact Sheet for Healthcare Providers:  IncredibleEmployment.be  This test is no t yet approved or cleared by the [place] FDA and  has been authorized for detection and/or diagnosis of SARS-CoV-2 by FDA under an Emergency Use Authorization (EUA). This EUA will remain  in effect (meaning this test can be used) for the duration of the COVID-19 declaration under Section 564(b)(1) of the Act, 21 U.S.C.section 360bbb-3(b)(1), unless the authorization is terminated  or revoked sooner.       Influenza A by PCR NEGATIVE NEGATIVE Final   Influenza B by PCR NEGATIVE NEGATIVE Final    Comment: (NOTE) The Xpert Xpress SARS-CoV-2/FLU/RSV plus assay is intended as an aid in the diagnosis of influenza from Nasopharyngeal swab specimens and should not be used as a sole basis for treatment. Nasal washings and aspirates are unacceptable for Xpert Xpress SARS-CoV-2/FLU/RSV testing.  Fact Sheet for Patients: EntrepreneurPulse.com.au  Fact Sheet for Healthcare Providers: IncredibleEmployment.be  This test is not yet approved or cleared by the [place] FDA and has been authorized for detection and/or diagnosis of SARS-CoV-2 by FDA under an Emergency Use Authorization (EUA). This EUA will remain in effect (meaning this test can be used) for the duration of the COVID-19 declaration under Section 564(b)(1)  of the Act, 21 U.S.C. section 360bbb-3(b)(1), unless the authorization is terminated or revoked.     Resp Syncytial Virus by PCR POSITIVE (A) NEGATIVE Final    Comment: (NOTE) Fact Sheet for Patients: EntrepreneurPulse.com.au  Fact Sheet for Healthcare Providers: IncredibleEmployment.be  This test is not  yet approved or cleared by the [place] FDA and has been authorized for detection and/or diagnosis of SARS-CoV-2 by FDA under an Emergency Use Authorization (EUA). This EUA will remain in effect (meaning this test can be used) for the duration of the COVID-19 declaration under Section 564(b)(1) of the Act, 21 U.S.C. section 360bbb-3(b)(1), unless the authorization is terminated or revoked.  Performed at [facility], [address] [place], [place] [address]     Labs: CBC: Recent Labs  Lab 02/27/22 2148 02/28/22 1240 03/01/22 0101  WBC 4.7 6.4 12.7*  NEUTROABS 2.5  --   --   HGB 17.3* 17.0 16.5  HCT 49.0 48.2 47.9  MCV 95.9 95.4 97.0  PLT 134* 151 [id number]   Basic Metabolic Panel: Recent Labs  Lab 02/27/22 2148 03/01/22 0101  NA 140 135  K 3.8 4.2  CL 102 101  CO2 28 24  GLUCOSE 94 132*  BUN 15 32*  CREATININE 1.05 1.41*  CALCIUM 9.2 9.1  MG 2.1  --    Liver Function Tests: No results for input(s): "AST", "ALT", "ALKPHOS", "BILITOT", "PROT", "ALBUMIN" in the last 168 hours. CBG: No results for input(s): "GLUCAP" in the last 168 hours.  Discharge time spent: greater than 30 minutes.  Signed: Deatra James, MD [facility] 03/01/2022

## 2022-03-01 NOTE — Progress Notes (Signed)
Heart Failure Nurse Navigator Progress Note  PCP: Ardith Dark, MD PCP-Cardiologist: None Admission Diagnosis: None Admitted from: Home  Presentation:   [name] presented with shortness of breath, cough, was recently seen at his PCP , tested negative for both Covid and the Flu. On admission was found to be in A fib, RVR, started on a cardizm drip. BNP 273, BP 108/96, HR 85, tested + for RSV,   Patient was educated on the sign and symptoms of heart failure, daily weights, diet/ fluid restrictions, taking all medications as prescribed and attending all medical appointments, he verbalized his understanding, a HF TOC appointment was scheduled for 03/14/22.  ECHO/ LVEF: 40-45 % New  Clinical Course:  Past Medical History:  Diagnosis Date   Anemia    Atrial fibrillation (HCC)    ED (erectile dysfunction)    Esophageal stricture    GERD (gastroesophageal reflux disease)    Sullivan Lone syndrome    HSV (herpes simplex virus) anogenital infection    Hyperlipidemia    Incomplete right bundle branch block    Partial tear of right rotator cuff      Social History   Socioeconomic History   Marital status: Divorced    Spouse name: Not on file   Number of children: 0   Years of education: Not on file   Highest education level: Master's degree (e.g., MA, MS, MEng, MEd, MSW, MBA)  Occupational History   Occupation: landscaper-self  employed  Tobacco Use   Smoking status: Never   Smokeless tobacco: Never  Vaping Use   Vaping Use: Never used  Substance and Sexual Activity   Alcohol use: Yes    Alcohol/week: 14.0 standard drinks of alcohol    Types: 14 Standard drinks or equivalent per week    Comment: 4-5 times a week, 3-4 at a time, has cut back recently   Drug use: Yes    Types: Marijuana    Comment: routine use of marijuana, occasional mushrooms   Sexual activity: Not on file  Other Topics Concern   Not on file  Social History Narrative   Not on file   Social  Determinants of Health   Financial Resource Strain: Low Risk  (03/01/2022)   Overall Financial Resource Strain (CARDIA)    Difficulty of Paying Living Expenses: Not very hard  Food Insecurity: No Food Insecurity (02/28/2022)   Hunger Vital Sign    Worried About Running Out of Food in the Last Year: Never true    Ran Out of Food in the Last Year: Never true  Transportation Needs: No Transportation Needs (03/01/2022)   PRAPARE - Administrator, Civil Service (Medical): No    Lack of Transportation (Non-Medical): No  Physical Activity: Not on file  Stress: Not on file  Social Connections: Not on file   Education Assessment and Provision:  Detailed education and instructions provided on heart failure disease management including the following:  Signs and symptoms of Heart Failure When to call the physician Importance of daily weights Low sodium diet Fluid restriction Medication management Anticipated future follow-up appointments  Patient education given on each of the above topics.  Patient acknowledges understanding via teach back method and acceptance of all instructions.  Education Materials:  "Living Better With Heart Failure" Booklet, HF zone tool, & Daily Weight Tracker Tool.  Patient has scale at home: yes Patient has pill box at home: NA    High Risk Criteria for Readmission and/or Poor Patient Outcomes: Heart failure  hospital admissions (last 6 months):  No Show rate: 2 % Difficult social situation: No Demonstrates medication adherence: Yes Primary Language: English Literacy level: Reading, writing, and comprehension  Barriers of Care:   Continued HF education Diet/ fluid/ daily weights  Considerations/Referrals:   Referral made to Heart Failure Pharmacist Stewardship: Yes Referral made to Heart Failure CSW/NCM TOC: No Referral made to Heart & Vascular TOC clinic: Yes, 03/14/22  Items for Follow-up on DC/TOC: Continued HF education Diet/  fluid/daily weights   Rhae Hammock, BSN, RN Heart Failure Print production planner Chat Only

## 2022-03-01 NOTE — Progress Notes (Signed)
Explained discharge instructions to patient. Reviewed follow up appointment and next medication administration times. Also reviewed education. Patient verbalized having an understanding for instructions given. All belongings are in the patient's possession to include TOC meds. IV and telemetry were removed by the floor staff. No other needs verbalized. Transported downstairs for discharge.

## 2022-03-04 DIAGNOSIS — J205 Acute bronchitis due to respiratory syncytial virus: Secondary | ICD-10-CM | POA: Diagnosis not present

## 2022-03-09 ENCOUNTER — Encounter (HOSPITAL_COMMUNITY): Payer: Self-pay | Admitting: Physician Assistant

## 2022-03-09 ENCOUNTER — Ambulatory Visit (HOSPITAL_COMMUNITY)
Admission: RE | Admit: 2022-03-09 | Discharge: 2022-03-09 | Disposition: A | Discharge status: Home / Self Care | Payer: BC Managed Care – PPO | Source: Ambulatory Visit | Attending: Physician Assistant | Admitting: Physician Assistant

## 2022-03-09 VITALS — BP 148/100 | HR 124 | Ht 73.0 in | Wt 201.6 lb

## 2022-03-09 DIAGNOSIS — I480 Paroxysmal atrial fibrillation: Secondary | ICD-10-CM | POA: Diagnosis not present

## 2022-03-09 DIAGNOSIS — E785 Hyperlipidemia, unspecified: Secondary | ICD-10-CM | POA: Insufficient documentation

## 2022-03-09 DIAGNOSIS — Z7984 Long term (current) use of oral hypoglycemic drugs: Secondary | ICD-10-CM | POA: Diagnosis not present

## 2022-03-09 DIAGNOSIS — I5021 Acute systolic (congestive) heart failure: Secondary | ICD-10-CM | POA: Insufficient documentation

## 2022-03-09 DIAGNOSIS — I4819 Other persistent atrial fibrillation: Secondary | ICD-10-CM | POA: Insufficient documentation

## 2022-03-09 DIAGNOSIS — Z79899 Other long term (current) drug therapy: Secondary | ICD-10-CM | POA: Insufficient documentation

## 2022-03-09 DIAGNOSIS — Z7901 Long term (current) use of anticoagulants: Secondary | ICD-10-CM | POA: Insufficient documentation

## 2022-03-09 MED ORDER — APIXABAN 5 MG PO TABS: 5.0000 mg | ORAL_TABLET | Freq: Two times a day (BID) | ORAL | 3 refills | Status: DC

## 2022-03-09 MED ORDER — METOPROLOL SUCCINATE ER 100 MG PO TB24: 100.0000 mg | ORAL_TABLET | Freq: Every day | ORAL | 3 refills | Status: DC

## 2022-03-09 NOTE — Patient Instructions (Addendum)
Cardioversion scheduled for: Tuesday, January 23rd   - Arrive at the [facility] and go to admitting at [facility] not eat or drink anything after midnight the night prior to your procedure.   - Take all your morning medication (except diabetic medications) with a sip of water prior to arrival.  - You will not be able to drive home after your procedure.    - Do NOT miss any doses of your blood thinner - if you should miss a dose please notify our office immediately.   - If you feel as if you go back into normal rhythm prior to scheduled cardioversion, please notify our office immediately.  If your procedure is canceled in the cardioversion suite you will be charged a cancellation fee.   If you are on weekly OZEMPIC, TRULICITY, MOUNJARO, WEGOVY, OR BYDUREON  Hold medication 7 days prior to scheduled procedure/anesthesia.  Restart medication on the normal dosing day after scheduled procedure/anesthesia  If you are on daily BYETTA, VICTOZA, ADLYXIN, OR RYBELSUS:   Hold medication 24 hours prior to scheduled procedure/anesthesia.   Restart medication on the following day after scheduled procedure/anesthesia   For those patients who have a scheduled procedure/anesthesia on the same day of the week as their dose, hold the medication on the day of surgery.  They can take their scheduled dose the week before.  **Patients on the above medications scheduled for elective procedures that have not held the medication for the appropriate amount of time are at risk of cancellation or change in the anesthetic plan.

## 2022-03-09 NOTE — Progress Notes (Signed)
Primary Care Physician: Vivi Barrack, MD Primary Cardiologist: Dr Margaretann Loveless  Primary Electrophysiologist: none Referring Physician: Dr Jeannette How is a 52 y.o. male with a history of Gilbert's disease HLD, systolic CHF, atrial fibrillation who presents for consultation in the [facility].  The patient was initially diagnosed with atrial fibrillation remotely 20+ years ago. He had done well without recurrence until he presented to the ED 02/27/22 with congestion and SOB. He tested positive for RSV and was incidentally found to be in afib with RVR. He was started on a diltiazem drip and transferred to [facility] and admitted. Echo showed EF 40-45%, suspected related to tachycardia. Diltiazem was discontinued and metoprolol started for rate control. He was also started on Eliquis for a CHADS2VASC score of 1. Today, patient reports that he does feel improved with rate control but is lightheaded at times. His heart rates at home are typically in the 70s.   Today, he denies symptoms of palpitations, chest pain, shortness of breath, orthopnea, PND, lower extremity edema, presyncope, syncope, snoring, daytime somnolence, bleeding, or neurologic sequela. The patient is tolerating medications without difficulties and is otherwise without complaint today.    Atrial Fibrillation Risk Factors:  he does not have symptoms or diagnosis of sleep apnea. he does not have a history of rheumatic fever. he does have a history of alcohol use. The patient does have a history of early familial atrial fibrillation or other arrhythmias. Father had afib.  he has a BMI of Body mass index is 26.6 kg/m.Marland Kitchen Filed Weights   03/09/22 1413  Weight: 91.4 kg    Family History  Problem Relation Age of Onset   Lung cancer Maternal Grandfather    Other Sister        Diabetes Insipidus   Rectal cancer Sister    Mitral valve prolapse Father    Ulcers Sister        peptic    Emphysema Paternal Grandfather    Lung cancer Paternal Grandfather    Stroke Neg Hx    Heart disease Neg Hx    Colon cancer Neg Hx    Colon polyps Neg Hx    Esophageal cancer Neg Hx    Stomach cancer Neg Hx      Atrial Fibrillation Management history:  Previous antiarrhythmic drugs: none Previous cardioversions: none Previous ablations: none CHADS2VASC score: 1 Anticoagulation history: Eliquis   Past Medical History:  Diagnosis Date   Anemia    Atrial fibrillation ([place])    ED (erectile dysfunction)    Esophageal stricture    GERD (gastroesophageal reflux disease)    Rosanna Randy syndrome    HSV (herpes simplex virus) anogenital infection    Hyperlipidemia    Incomplete right bundle branch block    Partial tear of right rotator cuff    Past Surgical History:  Procedure Laterality Date   COLONOSCOPY  2004 ; 2007; & 05-29-2012   last one with propofol and hemorroidectomy with sclerosing   COLONOSCOPY  09/05/2019   FINGER SURGERY  1994   repair/ reconstruction right index crush injury   INGUINAL HERNIA REPAIR Bilateral age 48   SHOULDER ARTHROSCOPY WITH LABRAL REPAIR Right 12/31/2013   Procedure: RIGHT SHOULDER ARTHROSCOPY WITH LABRAL REPAIR ;  Surgeon: Sydnee Cabal, MD;  Location: [facility];  Service: Orthopedics;  Laterality: Right;   UPPER GI ENDOSCOPY  2015   esophageal dilation    Current Outpatient Medications  Medication Sig  Dispense Refill   acetaminophen (TYLENOL) 325 MG tablet Take 2 tablets (650 mg total) by mouth every 6 (six) hours as needed for mild pain (or Fever >/= 101). 30 tablet 0   apixaban (ELIQUIS) 5 MG TABS tablet Take 1 tablet (5 mg total) by mouth 2 (two) times daily. 60 tablet 1   azelastine (ASTELIN) 0.1 % nasal spray Place 2 sprays into both nostrils daily as needed (sinus symptoms). Use in each nostril as directed     benzonatate (TESSALON) 200 MG capsule Take 1 capsule (200 mg total) by mouth 3 (three) times daily. 20 capsule  0   dapagliflozin propanediol (FARXIGA) 10 MG TABS tablet Take 1 tablet (10 mg total) by mouth daily. 30 tablet 0   FERROUS SULFATE PO Take 1 tablet by mouth in the morning.     fluticasone (FLONASE) 50 MCG/ACT nasal spray Place 2 sprays into both nostrils daily as needed (sinus symptoms).     fluticasone-salmeterol (ADVAIR HFA) 45-21 MCG/ACT inhaler Inhale 2 puffs into the lungs 2 (two) times daily. 12 g 12   guaiFENesin-dextromethorphan (ROBITUSSIN DM) 100-10 MG/5ML syrup Take 10 mLs by mouth every 8 (eight) hours. 118 mL 0   metoprolol succinate (TOPROL XL) 100 MG 24 hr tablet Take 1 tablet (100 mg total) by mouth daily. Take with or immediately following a meal. 30 tablet 11   Multiple Vitamin (MULTIVITAMIN) tablet Take 1 tablet by mouth in the morning.     Multiple Vitamins-Minerals (IMMUNE SUPPORT PO) Take 1 tablet by mouth in the morning.     omeprazole (PRILOSEC) 40 MG capsule TAKE ONE CAPSULE BY MOUTH TWICE A DAY (Patient taking differently: Take 40 mg by mouth in the morning.) 180 capsule 1   PE-Doxylamine-DM-GG-APAP (VICKS DAYQUIL/NYQUIL SEVERE PO) Take 30 mLs by mouth 2 (two) times daily as needed (flu/cold symptoms).     valACYclovir (VALTREX) 1000 MG tablet TAKE ONE TABLET BY MOUTH DAILY (Patient taking differently: Take 500 mg by mouth See admin instructions. 500 mg 2-3 times weekly as preventative) 90 tablet 0   No current facility-administered medications for this encounter.    No Known Allergies  Social History   Socioeconomic History   Marital status: Divorced    Spouse name: Not on file   Number of children: 0   Years of education: Not on file   Highest education level: Master's degree (e.g., MA, MS, MEng, MEd, MSW, MBA)  Occupational History   Occupation: landscaper-self  employed  Tobacco Use   Smoking status: Never   Smokeless tobacco: Never   Tobacco comments:    Never smoke 03/09/22  Vaping Use   Vaping Use: Never used  Substance and Sexual Activity    Alcohol use: Yes    Alcohol/week: 6.0 standard drinks of alcohol    Types: 6 Standard drinks or equivalent per week    Comment: 6 drinks a week 03/09/22   Drug use: Not Currently    Types: Marijuana    Comment: routine use of marijuana, occasional mushrooms   Sexual activity: Not on file  Other Topics Concern   Not on file  Social History Narrative   Not on file   Social Determinants of Health   Financial Resource Strain: Low Risk  (03/01/2022)   Overall Financial Resource Strain (CARDIA)    Difficulty of Paying Living Expenses: Not very hard  Food Insecurity: No Food Insecurity (02/28/2022)   Hunger Vital Sign    Worried About Running Out of Food in the Last Year:  Never true    Ran Out of Food in the Last Year: Never true  Transportation Needs: No Transportation Needs (03/01/2022)   PRAPARE - Hydrologist (Medical): No    Lack of Transportation (Non-Medical): No  Physical Activity: Not on file  Stress: Not on file  Social Connections: Not on file  Intimate Partner Violence: Not on file     ROS- All systems are reviewed and negative except as per the HPI above.  Physical Exam: Vitals:   03/09/22 1413  BP: (!) 148/100  Pulse: (!) 124  Weight: 91.4 kg  Height: 6\' 1"  (1.854 m)    GEN- The patient is a well appearing male, alert and oriented x 3 today.   Head- normocephalic, atraumatic Eyes-  Sclera clear, conjunctiva pink Ears- hearing intact Oropharynx- clear Neck- supple  Lungs- Clear to ausculation bilaterally, normal work of breathing Heart- irregular rate and rhythm, no murmurs, rubs or gallops  GI- soft, NT, ND, + BS Extremities- no clubbing, cyanosis, or edema MS- no significant deformity or atrophy Skin- no rash or lesion Psych- euthymic mood, full affect Neuro- strength and sensation are intact  Wt Readings from Last 3 Encounters:  03/09/22 91.4 kg  03/01/22 90.9 kg  02/24/22 93.6 kg    EKG today demonstrates  Afib  with RVR Vent. rate 124 BPM PR interval * ms QRS duration 98 ms QT/QTcB 320/459 ms  Echo 02/28/22 demonstrated   1. Left ventricular ejection fraction, by estimation, is 40 to 45%. The  left ventricle has mildly decreased function. The left ventricle  demonstrates global hypokinesis. Left ventricular diastolic function could  not be evaluated.   2. Right ventricular systolic function is mildly reduced. The right  ventricular size is normal. There is normal pulmonary artery systolic  pressure.   3. Left atrial size was mildly dilated.   4. Right atrial size was mildly dilated.   5. The mitral valve is normal in structure. No evidence of mitral valve  regurgitation. No evidence of mitral stenosis.   6. The aortic valve is normal in structure. Aortic valve regurgitation is  not visualized. No aortic stenosis is present.   7. The inferior vena cava is dilated in size with >50% respiratory  variability, suggesting right atrial pressure of 8 mmHg.   Epic records are reviewed at length today  CHA2DS2-VASc Score = 1  The patient's score is based upon: CHF History: 1 HTN History: 0 Diabetes History: 0 Stroke History: 0 Vascular Disease History: 0 Age Score: 0 Gender Score: 0       ASSESSMENT AND PLAN: 1. Persistent Atrial Fibrillation (ICD10:  I48.19) The patient's CHA2DS2-VASc score is 1, indicating a 0.6% annual risk of stroke.   Patient remains in persistent afib. His heart rate is typically in the 70s at home on his smart watch. Heart rate improved in office with rest today.  Will arrange for DCCV after 3 full weeks of anticoagulation.  Continue Eliquis 5 mg BID Continue Toprol 100 mg daily  2. Acute systolic CHF EF 10-93% Fluid status appears stable. Continue Farxiga and metoprolol  Will need to be reassess EF in SR.   Follow up with [facility] clinic as scheduled. AF clinic post DCCV.    [facility] [address] [place], [place] [address] [phone] 03/09/2022 2:24 PM

## 2022-03-11 ENCOUNTER — Other Ambulatory Visit (HOSPITAL_COMMUNITY): Payer: Self-pay

## 2022-03-14 ENCOUNTER — Telehealth (HOSPITAL_COMMUNITY): Payer: Self-pay | Admitting: *Deleted

## 2022-03-14 ENCOUNTER — Ambulatory Visit (INDEPENDENT_AMBULATORY_CARE_PROVIDER_SITE_OTHER): Payer: BC Managed Care – PPO | Admitting: Family Medicine

## 2022-03-14 ENCOUNTER — Ambulatory Visit (HOSPITAL_COMMUNITY)
Admission: RE | Admit: 2022-03-14 | Discharge: 2022-03-14 | Disposition: A | Discharge status: Home / Self Care | Payer: BC Managed Care – PPO | Source: Ambulatory Visit | Attending: Adult Health | Admitting: Adult Health

## 2022-03-14 VITALS — BP 130/88 | HR 68 | Wt 198.0 lb

## 2022-03-14 VITALS — BP 105/71 | HR 75 | Temp 97.2°F | Ht 73.0 in | Wt 197.2 lb

## 2022-03-14 DIAGNOSIS — Z8249 Family history of ischemic heart disease and other diseases of the circulatory system: Secondary | ICD-10-CM | POA: Diagnosis not present

## 2022-03-14 DIAGNOSIS — I5022 Chronic systolic (congestive) heart failure: Secondary | ICD-10-CM

## 2022-03-14 DIAGNOSIS — Z7901 Long term (current) use of anticoagulants: Secondary | ICD-10-CM | POA: Diagnosis not present

## 2022-03-14 DIAGNOSIS — I480 Paroxysmal atrial fibrillation: Secondary | ICD-10-CM | POA: Insufficient documentation

## 2022-03-14 DIAGNOSIS — I4819 Other persistent atrial fibrillation: Secondary | ICD-10-CM

## 2022-03-14 DIAGNOSIS — G4733 Obstructive sleep apnea (adult) (pediatric): Secondary | ICD-10-CM | POA: Diagnosis not present

## 2022-03-14 DIAGNOSIS — Z79899 Other long term (current) drug therapy: Secondary | ICD-10-CM | POA: Insufficient documentation

## 2022-03-14 DIAGNOSIS — J069 Acute upper respiratory infection, unspecified: Secondary | ICD-10-CM | POA: Diagnosis not present

## 2022-03-14 DIAGNOSIS — E785 Hyperlipidemia, unspecified: Secondary | ICD-10-CM | POA: Insufficient documentation

## 2022-03-14 DIAGNOSIS — R739 Hyperglycemia, unspecified: Secondary | ICD-10-CM | POA: Diagnosis not present

## 2022-03-14 LAB — BASIC METABOLIC PANEL
Anion gap: 7 (ref 5–15)
BUN: 19 mg/dL (ref 6–20)
CO2: 25 mmol/L (ref 22–32)
Calcium: 8.5 mg/dL — ABNORMAL LOW (ref 8.9–10.3)
Chloride: 103 mmol/L (ref 98–111)
Creatinine, Ser: 1.08 mg/dL (ref 0.61–1.24)
GFR, Estimated: >60 mL/min (ref >60)
Glucose, Bld: 84 mg/dL (ref 70–99)
Potassium: 4.4 mmol/L (ref 3.5–5.1)
Sodium: 135 mmol/L (ref 135–145)

## 2022-03-14 MED ORDER — APIXABAN 5 MG PO TABS: 5.0000 mg | ORAL_TABLET | Freq: Two times a day (BID) | ORAL | 2 refills | Status: DC

## 2022-03-14 MED ORDER — DAPAGLIFLOZIN PROPANEDIOL 10 MG PO TABS: 10.0000 mg | ORAL_TABLET | Freq: Every day | ORAL | 2 refills | Status: DC

## 2022-03-14 NOTE — H&P (View-Only) (Signed)
HEART & VASCULAR TRANSITION OF CARE CONSULT NOTE     Referring Physician: Dr Payton Emerald  Primary Care: Dr Jerline Pain  Primary Cardiologist: Dr Oval Linsey   HPI: Referred to clinic by Dr Payton Emerald for heart failure consultation.   Jacob Buchanan is a 52 year old with a history of PAF, combined systolic/diastolic heart failure, hyperlipidemia, OSA untreated, and Gilberts disease.   Admitted 03/01/23 fever and A fib RVR. Last in A fib 20 years ago. Prior to admit started feeling poorly with fever. Saw PCP and was negative for Covid/flu. He tried OTC with no improvement. He continued to worse and went to the ED. + RSV and in A fib RVR. Started on cardizem and heparin drip. Diuresed with IV lasix. Echo EF 40-45%. At discharge he was placed on Toprol XL + Farxiga + Eliquis. He was in A fib at discharge with rate 70-80s.   He was seen in the A fib clinic 03/09/22 and set up for cardioversion. He was set up for cardioversion the end of the month.   Overall feeling fine. Feeling better. Denies SOB/PND/Orthopnea. Able to walk 3 miles but has some dyspnea. No bleeding issues. Appetite ok. No fever or chills. Weight at home stable within 2 pounds. Taking all medications.    Cardiac Testing  Echo 02/28/22  1. Left ventricular ejection fraction, by estimation, is 40 to 45%. The  left ventricle has mildly decreased function. The left ventricle  demonstrates global hypokinesis. Left ventricular diastolic function could  not be evaluated.   2. Right ventricular systolic function is mildly reduced. The right  ventricular size is normal. There is normal pulmonary artery systolic  pressure.   3. Left atrial size was mildly dilated.   4. Right atrial size was mildly dilated.   5. The mitral valve is normal in structure. No evidence of mitral valve  regurgitation. No evidence of mitral stenosis.   6. The aortic valve is normal in structure. Aortic valve regurgitation is  not visualized. No aortic stenosis is  present.   7. The inferior vena cava is dilated in size with >50% respiratory  variability, suggesting right atrial pressure of 8 mmHg.     Review of Systems: [y] = yes, [ ]  = no   General: Weight gain [ ] ; Weight loss [ ] ; Anorexia [ ] ; Fatigue [ ] ; Fever [ ] ; Chills [ ] ; Weakness [ ]   Cardiac: Chest pain/pressure [ ] ; Resting SOB [ ] ; Mild Exertional SOB [Y ]; Orthopnea [ ] ; Pedal Edema [ ] ; Palpitations [ ] ; Syncope [ ] ; Presyncope [ ] ; Paroxysmal nocturnal dyspnea[ ]   Pulmonary: Cough [ ] ; Wheezing[ ] ; Hemoptysis[ ] ; Sputum [ ] ; Snoring [ ]   GI: Vomiting[ ] ; Dysphagia[ ] ; Melena[ ] ; Hematochezia [ ] ; Heartburn[ ] ; Abdominal pain [ ] ; Constipation [ ] ; Diarrhea [ ] ; BRBPR [ ]   GU: Hematuria[ ] ; Dysuria [ ] ; Nocturia[ ]   Vascular: Pain in legs with walking [ ] ; Pain in feet with lying flat [ ] ; Non-healing sores [ ] ; Stroke [ ] ; TIA [ ] ; Slurred speech [ ] ;  Neuro: Headaches[ ] ; Vertigo[ ] ; Seizures[ ] ; Paresthesias[ ] ;Blurred vision [ ] ; Diplopia [ ] ; Vision changes [ ]   Ortho/Skin: Arthritis [ ] ; Joint pain [ ] ; Muscle pain [ ] ; Joint swelling [ ] ; Back Pain [ ] ; Rash [ ]   Psych: Depression[ ] ; Anxiety[ ]   Heme: Bleeding problems [ ] ; Clotting disorders [ ] ; Anemia [ ]   Endocrine: Diabetes [ ] ; Thyroid dysfunction[ ]   Past Medical History:  Diagnosis Date   Anemia    Atrial fibrillation [facility])    ED (erectile dysfunction)    Esophageal stricture    GERD (gastroesophageal reflux disease)    Sullivan Lone syndrome    HSV (herpes simplex virus) anogenital infection    Hyperlipidemia    Incomplete right bundle branch block    Partial tear of right rotator cuff     Current Outpatient Medications  Medication Sig Dispense Refill   apixaban (ELIQUIS) 5 MG TABS tablet Take 1 tablet (5 mg total) by mouth 2 (two) times daily. 60 tablet 3   azelastine (ASTELIN) 0.1 % nasal spray Place 2 sprays into both nostrils daily as needed (sinus symptoms). Use in each nostril as directed      dapagliflozin propanediol (FARXIGA) 10 MG TABS tablet Take 1 tablet (10 mg total) by mouth daily. 30 tablet 0   FERROUS SULFATE PO Take 1 tablet by mouth in the morning.     fluticasone (FLONASE) 50 MCG/ACT nasal spray Place 2 sprays into both nostrils daily as needed (sinus symptoms).     fluticasone-salmeterol (ADVAIR HFA) 45-21 MCG/ACT inhaler Inhale 2 puffs into the lungs 2 (two) times daily. 12 g 12   metoprolol succinate (TOPROL XL) 100 MG 24 hr tablet Take 1 tablet (100 mg total) by mouth daily. Take with or immediately following a meal. 30 tablet 3   Multiple Vitamin (MULTIVITAMIN) tablet Take 1 tablet by mouth in the morning.     Multiple Vitamins-Minerals (IMMUNE SUPPORT PO) Take 1 tablet by mouth in the morning.     omeprazole (PRILOSEC) 40 MG capsule TAKE ONE CAPSULE BY MOUTH TWICE A DAY (Patient taking differently: Take 40 mg by mouth in the morning.) 180 capsule 1   valACYclovir (VALTREX) 1000 MG tablet TAKE ONE TABLET BY MOUTH DAILY (Patient taking differently: Take 500 mg by mouth See admin instructions. 500 mg 2-3 times weekly as preventative) 90 tablet 0   No current facility-administered medications for this encounter.    No Known Allergies    Social History   Socioeconomic History   Marital status: Divorced    Spouse name: Not on file   Number of children: 0   Years of education: Not on file   Highest education level: Master's degree (e.g., MA, MS, MEng, MEd, MSW, MBA)  Occupational History   Occupation: landscaper-self  employed  Tobacco Use   Smoking status: Never   Smokeless tobacco: Never   Tobacco comments:    Never smoke 03/09/22  Vaping Use   Vaping Use: Never used  Substance and Sexual Activity   Alcohol use: Yes    Alcohol/week: 6.0 standard drinks of alcohol    Types: 6 Standard drinks or equivalent per week    Comment: 6 drinks a week 03/09/22   Drug use: Not Currently    Types: Marijuana    Comment: routine use of marijuana, occasional mushrooms    Sexual activity: Not on file  Other Topics Concern   Not on file  Social History Narrative   Not on file   Social Determinants of Health   Financial Resource Strain: Low Risk  (03/01/2022)   Overall Financial Resource Strain (CARDIA)    Difficulty of Paying Living Expenses: Not very hard  Food Insecurity: No Food Insecurity (02/28/2022)   Hunger Vital Sign    Worried About Running Out of Food in the Last Year: Never true    Ran Out of Food in the Last Year: Never true  Transportation Needs: No Transportation Needs (03/01/2022)   PRAPARE - Administrator, Civil Service (Medical): No    Lack of Transportation (Non-Medical): No  Physical Activity: Not on file  Stress: Not on file  Social Connections: Not on file  Intimate Partner Violence: Not on file      Family History  Problem Relation Age of Onset   Lung cancer Maternal Grandfather    Other Sister        Diabetes Insipidus   Rectal cancer Sister    Mitral valve prolapse Father    Ulcers Sister        peptic   Emphysema Paternal Grandfather    Lung cancer Paternal Grandfather    Stroke Neg Hx    Heart disease Neg Hx    Colon cancer Neg Hx    Colon polyps Neg Hx    Esophageal cancer Neg Hx    Stomach cancer Neg Hx     Vitals:   03/14/22 1508  BP: 130/88  Pulse: 68  SpO2: 97%  Weight: 89.8 kg (198 lb)   Wt Readings from Last 3 Encounters:  03/14/22 89.8 kg (198 lb)  03/14/22 89.4 kg (197 lb 3.2 oz)  03/09/22 91.4 kg (201 lb 9.6 oz)    PHYSICAL EXAM: General:  Walked in the clinic. Well appearing. No respiratory difficulty HEENT: normal Neck: supple. no JVD. Carotids 2+ bilat; no bruits. No lymphadenopathy or thryomegaly appreciated. Cor: PMI nondisplaced. Tachy Irregular rate & rhythm. No rubs, gallops or murmurs. Lungs: clear Abdomen: soft, nontender, nondistended. No hepatosplenomegaly. No bruits or masses. Good bowel sounds. Extremities: no cyanosis, clubbing, rash, edema Neuro: alert &  oriented x 3, cranial nerves grossly intact. moves all 4 extremities w/o difficulty. Affect pleasant.  ECG: Afib 115 bpm    ASSESSMENT & PLAN: 1. Chronic HFrEF 02/2022 Echo EF 40-45% . Discussed suspect tachy mediated in the setting of A fib RVR. Repeat ECHO 3-4 months.  NYHA II. Volume status stable.  GDMT  Diuretic- appear euvolemic. Does not need loop diuretics.  BB- Continue current dose of Toprol XL Ace/ARB/ARNI- Hold off d/t elevated creatinine at discharge MRA- Hold off d/t elevated creatinine at discharge.  SGLT2i- Continue farxiga 10 mg daily  - Check BMET  - No med changes with recent elevated creatinine as noted above.   2. PAF Remote history of A fib 20 year ago. Back in A fib during recent admit precipiated by acute viral illness.  03/02/23 TSH 0.543  -EKG today--> Afib 115. On arrival heart rate 71.  -Continue Torprol XL  -Continue eliquis  and given coupon.  -He has follow up in A fib clinic in a few weeks. Plan for DC-CV 03/29/22   3. OSA,-Untreated. -Can't tolerate CPAP mask.  - He is going to make follow up with [facility].     Referred to HFSW (PCP, Medications, Transportation, ETOH Abuse, Drug Abuse, Insurance, Financial ):  No Refer to Pharmacy:  No Refer to Home Health: No Refer to Advanced Heart Failure Clinic: no  Refer to General Cardiology:  Refer back to Dr Duke Salvia.    Follow up as needed.   Tonye Becket NP- C 3:13 PM

## 2022-03-14 NOTE — Assessment & Plan Note (Signed)
Irregular rate and rhythm today. New onset diagnosis likely exacerbated by recent RSV infection.  He is anticoagulated on Eliquis.  Before with cardiology later today.  Will be undergoing cardioversion later this month.

## 2022-03-14 NOTE — Progress Notes (Signed)
HEART & VASCULAR TRANSITION OF CARE CONSULT NOTE     Referring Physician: Dr Payton Emerald  Primary Care: Dr Jerline Pain  Primary Cardiologist: Dr Oval Linsey   HPI: Referred to clinic by Dr Payton Emerald for heart failure consultation.   Mr Minix is a 52 year old with a history of PAF, combined systolic/diastolic heart failure, hyperlipidemia, OSA untreated, and Gilberts disease.   Admitted 03/01/23 fever and A fib RVR. Last in A fib 20 years ago. Prior to admit started feeling poorly with fever. Saw PCP and was negative for Covid/flu. He tried OTC with no improvement. He continued to worse and went to the ED. + RSV and in A fib RVR. Started on cardizem and heparin drip. Diuresed with IV lasix. Echo EF 40-45%. At discharge he was placed on Toprol XL + Farxiga + Eliquis. He was in A fib at discharge with rate 70-80s.   He was seen in the A fib clinic 03/09/22 and set up for cardioversion. He was set up for cardioversion the end of the month.   Overall feeling fine. Feeling better. Denies SOB/PND/Orthopnea. Able to walk 3 miles but has some dyspnea. No bleeding issues. Appetite ok. No fever or chills. Weight at home stable within 2 pounds. Taking all medications.    Cardiac Testing  Echo 02/28/22  1. Left ventricular ejection fraction, by estimation, is 40 to 45%. The  left ventricle has mildly decreased function. The left ventricle  demonstrates global hypokinesis. Left ventricular diastolic function could  not be evaluated.   2. Right ventricular systolic function is mildly reduced. The right  ventricular size is normal. There is normal pulmonary artery systolic  pressure.   3. Left atrial size was mildly dilated.   4. Right atrial size was mildly dilated.   5. The mitral valve is normal in structure. No evidence of mitral valve  regurgitation. No evidence of mitral stenosis.   6. The aortic valve is normal in structure. Aortic valve regurgitation is  not visualized. No aortic stenosis is  present.   7. The inferior vena cava is dilated in size with >50% respiratory  variability, suggesting right atrial pressure of 8 mmHg.     Review of Systems: [y] = yes, [ ]  = no   General: Weight gain [ ] ; Weight loss [ ] ; Anorexia [ ] ; Fatigue [ ] ; Fever [ ] ; Chills [ ] ; Weakness [ ]   Cardiac: Chest pain/pressure [ ] ; Resting SOB [ ] ; Mild Exertional SOB [Y ]; Orthopnea [ ] ; Pedal Edema [ ] ; Palpitations [ ] ; Syncope [ ] ; Presyncope [ ] ; Paroxysmal nocturnal dyspnea[ ]   Pulmonary: Cough [ ] ; Wheezing[ ] ; Hemoptysis[ ] ; Sputum [ ] ; Snoring [ ]   GI: Vomiting[ ] ; Dysphagia[ ] ; Melena[ ] ; Hematochezia [ ] ; Heartburn[ ] ; Abdominal pain [ ] ; Constipation [ ] ; Diarrhea [ ] ; BRBPR [ ]   GU: Hematuria[ ] ; Dysuria [ ] ; Nocturia[ ]   Vascular: Pain in legs with walking [ ] ; Pain in feet with lying flat [ ] ; Non-healing sores [ ] ; Stroke [ ] ; TIA [ ] ; Slurred speech [ ] ;  Neuro: Headaches[ ] ; Vertigo[ ] ; Seizures[ ] ; Paresthesias[ ] ;Blurred vision [ ] ; Diplopia [ ] ; Vision changes [ ]   Ortho/Skin: Arthritis [ ] ; Joint pain [ ] ; Muscle pain [ ] ; Joint swelling [ ] ; Back Pain [ ] ; Rash [ ]   Psych: Depression[ ] ; Anxiety[ ]   Heme: Bleeding problems [ ] ; Clotting disorders [ ] ; Anemia [ ]   Endocrine: Diabetes [ ] ; Thyroid dysfunction[ ]   Past Medical History:  Diagnosis Date   Anemia    Atrial fibrillation [facility])    ED (erectile dysfunction)    Esophageal stricture    GERD (gastroesophageal reflux disease)    Sullivan Lone syndrome    HSV (herpes simplex virus) anogenital infection    Hyperlipidemia    Incomplete right bundle branch block    Partial tear of right rotator cuff     Current Outpatient Medications  Medication Sig Dispense Refill   apixaban (ELIQUIS) 5 MG TABS tablet Take 1 tablet (5 mg total) by mouth 2 (two) times daily. 60 tablet 3   azelastine (ASTELIN) 0.1 % nasal spray Place 2 sprays into both nostrils daily as needed (sinus symptoms). Use in each nostril as directed      dapagliflozin propanediol (FARXIGA) 10 MG TABS tablet Take 1 tablet (10 mg total) by mouth daily. 30 tablet 0   FERROUS SULFATE PO Take 1 tablet by mouth in the morning.     fluticasone (FLONASE) 50 MCG/ACT nasal spray Place 2 sprays into both nostrils daily as needed (sinus symptoms).     fluticasone-salmeterol (ADVAIR HFA) 45-21 MCG/ACT inhaler Inhale 2 puffs into the lungs 2 (two) times daily. 12 g 12   metoprolol succinate (TOPROL XL) 100 MG 24 hr tablet Take 1 tablet (100 mg total) by mouth daily. Take with or immediately following a meal. 30 tablet 3   Multiple Vitamin (MULTIVITAMIN) tablet Take 1 tablet by mouth in the morning.     Multiple Vitamins-Minerals (IMMUNE SUPPORT PO) Take 1 tablet by mouth in the morning.     omeprazole (PRILOSEC) 40 MG capsule TAKE ONE CAPSULE BY MOUTH TWICE A DAY (Patient taking differently: Take 40 mg by mouth in the morning.) 180 capsule 1   valACYclovir (VALTREX) 1000 MG tablet TAKE ONE TABLET BY MOUTH DAILY (Patient taking differently: Take 500 mg by mouth See admin instructions. 500 mg 2-3 times weekly as preventative) 90 tablet 0   No current facility-administered medications for this encounter.    No Known Allergies    Social History   Socioeconomic History   Marital status: Divorced    Spouse name: Not on file   Number of children: 0   Years of education: Not on file   Highest education level: Master's degree (e.g., MA, MS, MEng, MEd, MSW, MBA)  Occupational History   Occupation: landscaper-self  employed  Tobacco Use   Smoking status: Never   Smokeless tobacco: Never   Tobacco comments:    Never smoke 03/09/22  Vaping Use   Vaping Use: Never used  Substance and Sexual Activity   Alcohol use: Yes    Alcohol/week: 6.0 standard drinks of alcohol    Types: 6 Standard drinks or equivalent per week    Comment: 6 drinks a week 03/09/22   Drug use: Not Currently    Types: Marijuana    Comment: routine use of marijuana, occasional mushrooms    Sexual activity: Not on file  Other Topics Concern   Not on file  Social History Narrative   Not on file   Social Determinants of Health   Financial Resource Strain: Low Risk  (03/01/2022)   Overall Financial Resource Strain (CARDIA)    Difficulty of Paying Living Expenses: Not very hard  Food Insecurity: No Food Insecurity (02/28/2022)   Hunger Vital Sign    Worried About Running Out of Food in the Last Year: Never true    Ran Out of Food in the Last Year: Never true  Transportation Needs: No Transportation Needs (03/01/2022)   PRAPARE - Administrator, Civil Service (Medical): No    Lack of Transportation (Non-Medical): No  Physical Activity: Not on file  Stress: Not on file  Social Connections: Not on file  Intimate Partner Violence: Not on file      Family History  Problem Relation Age of Onset   Lung cancer Maternal Grandfather    Other Sister        Diabetes Insipidus   Rectal cancer Sister    Mitral valve prolapse Father    Ulcers Sister        peptic   Emphysema Paternal Grandfather    Lung cancer Paternal Grandfather    Stroke Neg Hx    Heart disease Neg Hx    Colon cancer Neg Hx    Colon polyps Neg Hx    Esophageal cancer Neg Hx    Stomach cancer Neg Hx     Vitals:   03/14/22 1508  BP: 130/88  Pulse: 68  SpO2: 97%  Weight: 89.8 kg (198 lb)   Wt Readings from Last 3 Encounters:  03/14/22 89.8 kg (198 lb)  03/14/22 89.4 kg (197 lb 3.2 oz)  03/09/22 91.4 kg (201 lb 9.6 oz)    PHYSICAL EXAM: General:  Walked in the clinic. Well appearing. No respiratory difficulty HEENT: normal Neck: supple. no JVD. Carotids 2+ bilat; no bruits. No lymphadenopathy or thryomegaly appreciated. Cor: PMI nondisplaced. Tachy Irregular rate & rhythm. No rubs, gallops or murmurs. Lungs: clear Abdomen: soft, nontender, nondistended. No hepatosplenomegaly. No bruits or masses. Good bowel sounds. Extremities: no cyanosis, clubbing, rash, edema Neuro: alert &  oriented x 3, cranial nerves grossly intact. moves all 4 extremities w/o difficulty. Affect pleasant.  ECG: Afib 115 bpm    ASSESSMENT & PLAN: 1. Chronic HFrEF 02/2022 Echo EF 40-45% . Discussed suspect tachy mediated in the setting of A fib RVR. Repeat ECHO 3-4 months.  NYHA II. Volume status stable.  GDMT  Diuretic- appear euvolemic. Does not need loop diuretics.  BB- Continue current dose of Toprol XL Ace/ARB/ARNI- Hold off d/t elevated creatinine at discharge MRA- Hold off d/t elevated creatinine at discharge.  SGLT2i- Continue farxiga 10 mg daily  - Check BMET  - No med changes with recent elevated creatinine as noted above.   2. PAF Remote history of A fib 20 year ago. Back in A fib during recent admit precipiated by acute viral illness.  03/02/23 TSH 0.543  -EKG today--> Afib 115. On arrival heart rate 71.  -Continue Torprol XL  -Continue eliquis  and given coupon.  -He has follow up in A fib clinic in a few weeks. Plan for DC-CV 03/29/22   3. OSA,-Untreated. -Can't tolerate CPAP mask.  - He is going to make follow up with [facility].     Referred to HFSW (PCP, Medications, Transportation, ETOH Abuse, Drug Abuse, Insurance, Financial ):  No Refer to Pharmacy:  No Refer to Home Health: No Refer to Advanced Heart Failure Clinic: no  Refer to General Cardiology:  Refer back to Dr Duke Salvia.    Follow up as needed.   Tonye Becket NP- C 3:13 PM

## 2022-03-14 NOTE — Patient Instructions (Addendum)
EKG done today.  Labs done today. We will contact you only if your labs are abnormal.  No medication changes were made. Please continue all current medications as prescribed.  Your physician recommends that you schedule a follow-up appointment in: as needed

## 2022-03-14 NOTE — Assessment & Plan Note (Signed)
Recently started on Farxiga 10 mg daily by cardiology for borderline EF.  He has been tolerating well.  We can recheck A1c at his next visit.

## 2022-03-14 NOTE — Telephone Encounter (Signed)
Called to confirm Heart & Vascular Transitions of Care appointment at 3 pm on 03/14/22. Patient reminded to bring all medications and pill box organizer with them. Confirmed patient has transportation. Gave directions, instructed to utilize [place] parking.  Confirmed appointment prior to ending call.    Earnestine Leys, BSN, Clinical cytogeneticist Only

## 2022-03-14 NOTE — Progress Notes (Signed)
   Jacob Buchanan is a 52 y.o. male who presents today for an office visit.  Assessment/Plan:  New/Acute Problems: URI secondary to RSV No red flags.  Reassuring exam today.  Symptoms seem to be improving.  He can continue over-the-counter medications.  Chronic Problems Addressed Today: Persistent atrial fibrillation (HCC) Irregular rate and rhythm today. New onset diagnosis likely exacerbated by recent RSV infection.  He is anticoagulated on Eliquis.  Before with cardiology later today.  Will be undergoing cardioversion later this month.  Hyperglycemia Recently started on Farxiga 10 mg daily by cardiology for borderline EF.  He has been tolerating well.  We can recheck A1c at his next visit.     Subjective:  HPI:  Patient here today for hospitalization follow-up.  He presented today ED on 03/01/2022 with cough and shortness of breath.  Prior to this he was dealing with a respiratory illness and was tested for COVID and flu which were negative.  Symptoms continue to worsen and he presented to the ED. in the ED was found to be positive for RSV however was also found to be in A-fib with RVR.  He was started on diltiazem and heparin drip.  Cardiology was consulted.  His diltiazem was switched to oral metoprolol and he was started on anticoagulation with Eliquis.  He had cardiac workup including echocardiogram which showed borderline EF 40 to 45%.  He was also started on Farxiga.  He was discharged home in stable condition.   Over the last several days he has been doing a little better.  No chest pain or shortness of breath.  Still has some cough.  He is following up with cardiology later today.  Is been compliant with his medications.        Objective:  Physical Exam: BP 105/71   Pulse 75   Temp (!) 97.2 F (36.2 C) (Temporal)   Ht 6\' 1"  (1.854 m)   Wt 197 lb 3.2 oz (89.4 kg)   SpO2 98%   BMI 26.02 kg/m   Gen: No acute distress, resting comfortably CV: Irregular rate and  rhythm with no murmurs appreciated Pulm: Normal work of breathing, clear to auscultation bilaterally with no crackles, wheezes, or rhonchi Neuro: Grossly normal, moves all extremities Psych: Normal affect and thought content      Loyd Salvador M. Jerline Pain, MD 03/14/2022 2:22 PM

## 2022-03-14 NOTE — Patient Instructions (Signed)
It was very nice to see you today!  I am glad you are doing better.  We will see you back when you are due for your physical later this year.  Please let us know if we can be of any further assistance.  Take care, Dr Jerline Pain  PLEASE NOTE:  If you had any lab tests, please let us know if you have not heard back within a few days. You may see your results on mychart before we have a chance to review them but we will give you a call once they are reviewed by [place].   If we ordered any referrals today, please let us know if you have not heard from their office within the next week.   If you had any urgent prescriptions sent in today, please check with the pharmacy within an hour of our visit to make sure the prescription was transmitted appropriately.   Please try these tips to maintain a healthy lifestyle:  Eat at least 3 REAL meals and 1-2 snacks per day.  Aim for no more than 5 hours between eating.  If you eat breakfast, please do so within one hour of getting up.   Each meal should contain half fruits/vegetables, one quarter protein, and one quarter carbs (no bigger than a computer mouse)  Cut down on sweet beverages. This includes juice, soda, and sweet tea.   Drink at least 1 glass of water with each meal and aim for at least 8 glasses per day  Exercise at least 150 minutes every week.

## 2022-03-15 ENCOUNTER — Encounter: Payer: Self-pay | Admitting: Family Medicine

## 2022-03-15 ENCOUNTER — Telehealth: Payer: Self-pay | Admitting: Family Medicine

## 2022-03-15 LAB — T3: T3, Total: 96 ng/dL (ref 71–180)

## 2022-03-15 NOTE — Telephone Encounter (Signed)
Please advise 

## 2022-03-15 NOTE — Telephone Encounter (Signed)
Ok to place referral.  Algis Greenhouse. Jerline Pain, MD 03/15/2022 3:09 PM

## 2022-03-15 NOTE — Telephone Encounter (Signed)
Pt states he needs a referral to [facility]. Please advise and call pt back with details.

## 2022-03-16 ENCOUNTER — Other Ambulatory Visit: Payer: Self-pay | Admitting: *Deleted

## 2022-03-16 DIAGNOSIS — G4730 Sleep apnea, unspecified: Secondary | ICD-10-CM

## 2022-03-16 NOTE — Telephone Encounter (Signed)
See mychart message. Ok to place referral.  Algis Greenhouse. Jerline Pain, MD 03/16/2022 9:45 AM

## 2022-03-16 NOTE — Telephone Encounter (Signed)
Referral placed.

## 2022-03-17 ENCOUNTER — Other Ambulatory Visit: Payer: Self-pay | Admitting: Family Medicine

## 2022-03-17 DIAGNOSIS — G4730 Sleep apnea, unspecified: Secondary | ICD-10-CM

## 2022-03-17 NOTE — Telephone Encounter (Signed)
Referral placed.

## 2022-03-23 DIAGNOSIS — D3131 Benign neoplasm of right choroid: Secondary | ICD-10-CM | POA: Diagnosis not present

## 2022-03-28 ENCOUNTER — Ambulatory Visit (HOSPITAL_COMMUNITY)
Admission: RE | Admit: 2022-03-28 | Discharge: 2022-03-28 | Disposition: A | Discharge status: Home / Self Care | Payer: BC Managed Care – PPO | Source: Ambulatory Visit | Attending: Physician Assistant | Admitting: Physician Assistant

## 2022-03-28 DIAGNOSIS — I4819 Other persistent atrial fibrillation: Secondary | ICD-10-CM

## 2022-03-28 LAB — CBC
HCT: 51.2 % (ref 39.0–52.0)
Hemoglobin: 17.7 g/dL — ABNORMAL HIGH (ref 13.0–17.0)
MCH: 33.5 pg (ref 26.0–34.0)
MCHC: 34.6 g/dL (ref 30.0–36.0)
MCV: 97 fL (ref 80.0–100.0)
Platelets: 172 10*3/uL (ref 150–400)
RBC: 5.28 MIL/uL (ref 4.22–5.81)
RDW: 12.2 % (ref 11.5–15.5)
WBC: 5.6 10*3/uL (ref 4.0–10.5)
nRBC: 0 % (ref 0.0–0.2)

## 2022-03-28 LAB — BASIC METABOLIC PANEL
Anion gap: 7 (ref 5–15)
BUN: 13 mg/dL (ref 6–20)
CO2: 28 mmol/L (ref 22–32)
Calcium: 8.9 mg/dL (ref 8.9–10.3)
Chloride: 104 mmol/L (ref 98–111)
Creatinine, Ser: 1.29 mg/dL — ABNORMAL HIGH (ref 0.61–1.24)
GFR, Estimated: >60 mL/min (ref >60)
Glucose, Bld: 93 mg/dL (ref 70–99)
Potassium: 4.9 mmol/L (ref 3.5–5.1)
Sodium: 139 mmol/L (ref 135–145)

## 2022-03-29 ENCOUNTER — Ambulatory Visit (HOSPITAL_COMMUNITY): Payer: BC Managed Care – PPO | Admitting: Certified Registered Nurse Anesthetist

## 2022-03-29 ENCOUNTER — Encounter (HOSPITAL_COMMUNITY)
Admission: RE | Disposition: A | Discharge status: Home / Self Care | Payer: Self-pay | Source: Ambulatory Visit | Attending: Cardiovascular Disease

## 2022-03-29 ENCOUNTER — Encounter (HOSPITAL_COMMUNITY): Payer: Self-pay | Admitting: Cardiovascular Disease

## 2022-03-29 ENCOUNTER — Other Ambulatory Visit: Payer: Self-pay

## 2022-03-29 ENCOUNTER — Ambulatory Visit (HOSPITAL_COMMUNITY)
Admission: RE | Admit: 2022-03-29 | Discharge: 2022-03-29 | Disposition: A | Discharge status: Home / Self Care | Payer: BC Managed Care – PPO | Source: Ambulatory Visit | Attending: Cardiovascular Disease | Admitting: Cardiovascular Disease

## 2022-03-29 DIAGNOSIS — Z79899 Other long term (current) drug therapy: Secondary | ICD-10-CM | POA: Diagnosis not present

## 2022-03-29 DIAGNOSIS — G4733 Obstructive sleep apnea (adult) (pediatric): Secondary | ICD-10-CM | POA: Diagnosis not present

## 2022-03-29 DIAGNOSIS — K219 Gastro-esophageal reflux disease without esophagitis: Secondary | ICD-10-CM | POA: Insufficient documentation

## 2022-03-29 DIAGNOSIS — I4891 Unspecified atrial fibrillation: Secondary | ICD-10-CM | POA: Diagnosis not present

## 2022-03-29 DIAGNOSIS — I4819 Other persistent atrial fibrillation: Secondary | ICD-10-CM | POA: Diagnosis not present

## 2022-03-29 DIAGNOSIS — I480 Paroxysmal atrial fibrillation: Secondary | ICD-10-CM | POA: Diagnosis not present

## 2022-03-29 DIAGNOSIS — J45909 Unspecified asthma, uncomplicated: Secondary | ICD-10-CM | POA: Insufficient documentation

## 2022-03-29 DIAGNOSIS — I5042 Chronic combined systolic (congestive) and diastolic (congestive) heart failure: Secondary | ICD-10-CM | POA: Insufficient documentation

## 2022-03-29 DIAGNOSIS — Z7901 Long term (current) use of anticoagulants: Secondary | ICD-10-CM | POA: Insufficient documentation

## 2022-03-29 HISTORY — PX: CARDIOVERSION: SHX1299

## 2022-03-29 SURGERY — CARDIOVERSION
Anesthesia: General

## 2022-03-29 MED ORDER — SODIUM CHLORIDE 0.9 % IV SOLN: INTRAVENOUS | Status: DC

## 2022-03-29 MED ORDER — PROPOFOL 10 MG/ML IV BOLUS
INTRAVENOUS | Status: DC | PRN
  Administered 2022-03-29: 50 mg via INTRAVENOUS
  Administered 2022-03-29 (×2): 20 mg via INTRAVENOUS

## 2022-03-29 NOTE — Op Note (Signed)
Procedure: Electrical Cardioversion Indications:  Atrial Fibrillation  Procedure Details:  Consent: Risks of procedure as well as the alternatives and risks of each were explained to the (patient/caregiver).  Consent for procedure obtained.  Time Out: Verified patient identification, verified procedure, site/side was marked, verified correct patient position, special equipment/implants available, medications/allergies/relevent history reviewed, required imaging and test results available.  Performed  Patient placed on cardiac monitor, pulse oximetry, supplemental oxygen as necessary.  Sedation given:  propofol 90 mg IV, Dr. Ermalene Postin Pacer pads placed anterior and posterior chest.  Cardioverted 3 time(s).  Cardioversion with synchronized biphasic 120J/ 150 J/ 200 J shock. Each time, converted back to .afio after about 3 beats in NSR.  Evaluation: Findings: Post procedure EKG shows: Atrial Fibrillation Complications: None Patient did tolerate procedure well.  Time Spent Directly with the Patient:  30 minutes   Jacob Buchanan 03/29/2022, 9:02 AM

## 2022-03-29 NOTE — Transfer of Care (Signed)
Immediate Anesthesia Transfer of Care Note  Patient: Jacob Buchanan  Procedure(s) Performed: CARDIOVERSION  Patient Location: Endoscopy Unit  Anesthesia Type:General  Level of Consciousness: drowsy and patient cooperative  Airway & Oxygen Therapy: Patient Spontanous Breathing  Post-op Assessment: Report given to RN, Post -op Vital signs reviewed and stable, and Patient moving all extremities X 4  Post vital signs: Reviewed and stable  Last Vitals:  Vitals Value Taken Time  BP 118/105   Temp    Pulse 135   Resp 18   SpO2 96     Last Pain:  Vitals:   03/29/22 0825  TempSrc: Temporal  PainSc: 0-No pain         Complications: No notable events documented.

## 2022-03-29 NOTE — Interval H&P Note (Signed)
History and Physical Interval Note:  03/29/2022 8:46 AM  Jacob Buchanan  has presented today for surgery, with the diagnosis of AFIB.  The various methods of treatment have been discussed with the patient and family. After consideration of risks, benefits and other options for treatment, the patient has consented to  Procedure(s): CARDIOVERSION (N/A) as a surgical intervention.  The patient's history has been reviewed, patient examined, no change in status, stable for surgery.  I have reviewed the patient's chart and labs.  Questions were answered to the patient's satisfaction.     Varshini Arrants

## 2022-03-29 NOTE — Discharge Instructions (Signed)
Electrical Cardioversion °Electrical cardioversion is the delivery of a jolt of electricity to restore a normal rhythm to the heart. A rhythm that is too fast or is not regular keeps the heart from pumping well. In this procedure, sticky patches or metal paddles are placed on the chest to deliver electricity to the heart from a device. °This procedure may be done in an emergency if: °There is low or no blood pressure as a result of the heart rhythm. °Normal rhythm must be restored as fast as possible to protect the brain and heart from further damage. °It may save a life. °This may also be a scheduled procedure for irregular or fast heart rhythms that are not immediately life-threatening. ° °What can I expect after the procedure? °Your blood pressure, heart rate, breathing rate, and blood oxygen level will be monitored until you leave the hospital or clinic. °Your heart rhythm will be watched to make sure it does not change. °You may have some redness on the skin where the shocks were given. Over the counter cortizone cream may be helpful.  °Follow these instructions at home: °Do not drive for 24 hours if you were given a sedative during your procedure. °Take over-the-counter and prescription medicines only as told by your health care provider. °Ask your health care provider how to check your pulse. Check it often. °Rest for 48 hours after the procedure or as told by your health care provider. °Avoid or limit your caffeine use as told by your health care provider. °Keep all follow-up visits as told by your health care provider. This is important. °Contact a health care provider if: °You feel like your heart is beating too quickly or your pulse is not regular. °You have a serious muscle cramp that does not go away. °Get help right away if: °You have discomfort in your chest. °You are dizzy or you feel faint. °You have trouble breathing or you are short of breath. °Your speech is slurred. °You have trouble moving an  arm or leg on one side of your body. °Your fingers or toes turn cold or blue. °Summary °Electrical cardioversion is the delivery of a jolt of electricity to restore a normal rhythm to the heart. °This procedure may be done right away in an emergency or may be a scheduled procedure if the condition is not an emergency. °Generally, this is a safe procedure. °After the procedure, check your pulse often as told by your health care provider. °This information is not intended to replace advice given to you by your health care provider. Make sure you discuss any questions you have with your health care provider. °Document Revised: 09/24/2018 Document Reviewed: 09/24/2018 °Elsevier Patient Education © 2020 [employer]  °

## 2022-03-29 NOTE — Interval H&P Note (Signed)
History and Physical Interval Note:  03/29/2022 8:45 AM  Jacob Buchanan  has presented today for surgery, with the diagnosis of AFIB.  The various methods of treatment have been discussed with the patient and family. After consideration of risks, benefits and other options for treatment, the patient has consented to  Procedure(s): CARDIOVERSION (N/A) as a surgical intervention.  The patient's history has been reviewed, patient examined, no change in status, stable for surgery.  I have reviewed the patient's chart and labs.  Questions were answered to the patient's satisfaction.     Mariany Mackintosh

## 2022-03-30 ENCOUNTER — Telehealth (HOSPITAL_COMMUNITY): Payer: Self-pay

## 2022-03-30 ENCOUNTER — Other Ambulatory Visit (HOSPITAL_COMMUNITY): Payer: Self-pay

## 2022-03-30 NOTE — Telephone Encounter (Signed)
Patient called in asking about a refill for Jacob Buchanan. He states he will be out and the pharmacy will not refill in time. I show we sent in a prescription on 03/14/22 for #30. I called [facility] and left a message to obtain more clarification on the denial from the pharmacy

## 2022-03-30 NOTE — Telephone Encounter (Signed)
Received VM from patient stating he is out of [place] but pharmacy cannot refill until 1/28. Attempted to call pt back but was unable to reach patient. Left VM stating that pharmacy may be able to use override code for lost/stolen meds etc. If that doesn't work, then we can set aside a week worth of [place] samples for him to pick up from clinic. Left callback number prior to ending call.

## 2022-03-31 NOTE — Anesthesia Preprocedure Evaluation (Signed)
Anesthesia Evaluation  Patient identified by MRN, date of birth, ID band Patient awake    Reviewed: Allergy & Precautions, NPO status , Patient's Chart, lab work & pertinent test results  History of Anesthesia Complications Negative for: history of anesthetic complications  Airway Mallampati: III  TM Distance: >3 FB Neck ROM: Full    Dental  (+) Dental Advisory Given   Pulmonary asthma    breath sounds clear to auscultation       Cardiovascular + dysrhythmias Atrial Fibrillation  Rhythm:Irregular     Neuro/Psych  Headaches    GI/Hepatic Neg liver ROS,GERD  ,,  Endo/Other    Renal/GU negative Renal ROS     Musculoskeletal negative musculoskeletal ROS (+)    Abdominal   Peds  Hematology   Anesthesia Other Findings   Reproductive/Obstetrics                             Anesthesia Physical Anesthesia Plan  ASA: 2  Anesthesia Plan: General   Post-op Pain Management: Minimal or no pain anticipated   Induction: Intravenous  PONV Risk Score and Plan: 2 and Treatment may vary due to age or medical condition  Airway Management Planned: Mask  Additional Equipment: None  Intra-op Plan:   Post-operative Plan:   Informed Consent: I have reviewed the patients History and Physical, chart, labs and discussed the procedure including the risks, benefits and alternatives for the proposed anesthesia with the patient or authorized representative who has indicated his/her understanding and acceptance.     Dental advisory given  Plan Discussed with: CRNA  Anesthesia Plan Comments:        Anesthesia Quick Evaluation

## 2022-03-31 NOTE — Anesthesia Postprocedure Evaluation (Signed)
Anesthesia Post Note  Patient: Jacob Buchanan  Procedure(s) Performed: CARDIOVERSION     Patient location during evaluation: Endoscopy Anesthesia Type: General Level of consciousness: awake and alert Pain management: pain level controlled Vital Signs Assessment: post-procedure vital signs reviewed and stable Respiratory status: spontaneous breathing, nonlabored ventilation and respiratory function stable Cardiovascular status: blood pressure returned to baseline and stable Postop Assessment: no apparent nausea or vomiting Anesthetic complications: no   No notable events documented.  Last Vitals:  Vitals:   03/29/22 0930 03/29/22 0931  BP: 117/88 117/88  Pulse: 84 77  Resp: (!) 21 17  Temp:    SpO2: 95% 95%    Last Pain:  Vitals:   03/30/22 1432  TempSrc:   PainSc: 0-No pain                 Verbie Babic

## 2022-04-07 ENCOUNTER — Ambulatory Visit (HOSPITAL_COMMUNITY)
Admission: RE | Admit: 2022-04-07 | Discharge: 2022-04-07 | Disposition: A | Discharge status: Home / Self Care | Payer: BC Managed Care – PPO | Source: Ambulatory Visit | Attending: Physician Assistant | Admitting: Physician Assistant

## 2022-04-07 ENCOUNTER — Encounter (HOSPITAL_COMMUNITY): Payer: Self-pay | Admitting: Physician Assistant

## 2022-04-07 VITALS — BP 124/94 | HR 135 | Ht 73.0 in | Wt 201.0 lb

## 2022-04-07 DIAGNOSIS — I5021 Acute systolic (congestive) heart failure: Secondary | ICD-10-CM | POA: Insufficient documentation

## 2022-04-07 DIAGNOSIS — I4819 Other persistent atrial fibrillation: Secondary | ICD-10-CM

## 2022-04-07 DIAGNOSIS — Z7901 Long term (current) use of anticoagulants: Secondary | ICD-10-CM | POA: Diagnosis not present

## 2022-04-07 DIAGNOSIS — I4510 Unspecified right bundle-branch block: Secondary | ICD-10-CM | POA: Insufficient documentation

## 2022-04-07 DIAGNOSIS — G4733 Obstructive sleep apnea (adult) (pediatric): Secondary | ICD-10-CM | POA: Insufficient documentation

## 2022-04-07 DIAGNOSIS — Z8249 Family history of ischemic heart disease and other diseases of the circulatory system: Secondary | ICD-10-CM | POA: Insufficient documentation

## 2022-04-07 DIAGNOSIS — Z79899 Other long term (current) drug therapy: Secondary | ICD-10-CM | POA: Insufficient documentation

## 2022-04-07 MED ORDER — AMIODARONE HCL 200 MG PO TABS: ORAL_TABLET | ORAL | 0 refills | Status: DC

## 2022-04-07 NOTE — Progress Notes (Signed)
Primary Care Physician: Vivi Barrack, MD Primary Cardiologist: Dr Margaretann Loveless  Primary Electrophysiologist: none Referring Physician: Dr Jeannette How is a 52 y.o. male with a history of Gilbert's disease HLD, systolic CHF, atrial fibrillation who presents for follow up in the [facility].  The patient was initially diagnosed with atrial fibrillation remotely 20+ years ago. He had done well without recurrence until he presented to the ED 02/27/22 with congestion and SOB. He tested positive for RSV and was incidentally found to be in afib with RVR. He was started on a diltiazem drip and transferred to [facility] and admitted. Echo showed EF 40-45%, suspected related to tachycardia. Diltiazem was discontinued and metoprolol started for rate control. He was also started on Eliquis for a CHADS2VASC score of 1.   On follow up today, patient is s/p DCCV 03/29/22 which was unsuccessful. He remains in afib today with rapid rates. He is unaware of his arrhythmia. No bleeding issues on anticoagulation.   Today, he denies symptoms of palpitations, chest pain, shortness of breath, orthopnea, PND, lower extremity edema, presyncope, syncope, snoring, daytime somnolence, bleeding, or neurologic sequela. The patient is tolerating medications without difficulties and is otherwise without complaint today.    Atrial Fibrillation Risk Factors:  he does not have symptoms or diagnosis of sleep apnea. he does not have a history of rheumatic fever. he does have a history of alcohol use. The patient does have a history of early familial atrial fibrillation or other arrhythmias. Father had afib.  he has a BMI of Body mass index is 26.52 kg/m.Marland Kitchen Filed Weights   04/07/22 1459  Weight: 91.2 kg    Family History  Problem Relation Age of Onset   Lung cancer Maternal Grandfather    Other Sister        Diabetes Insipidus   Rectal cancer Sister    Mitral valve prolapse  Father    Ulcers Sister        peptic   Emphysema Paternal Grandfather    Lung cancer Paternal Grandfather    Stroke Neg Hx    Heart disease Neg Hx    Colon cancer Neg Hx    Colon polyps Neg Hx    Esophageal cancer Neg Hx    Stomach cancer Neg Hx      Atrial Fibrillation Management history:  Previous antiarrhythmic drugs: none Previous cardioversions: 03/29/22 Previous ablations: none CHADS2VASC score: 1 Anticoagulation history: Eliquis   Past Medical History:  Diagnosis Date   Anemia    Atrial fibrillation (HCC)    ED (erectile dysfunction)    Esophageal stricture    GERD (gastroesophageal reflux disease)    Rosanna Randy syndrome    HSV (herpes simplex virus) anogenital infection    Hyperlipidemia    Incomplete right bundle branch block    Partial tear of right rotator cuff    Past Surgical History:  Procedure Laterality Date   CARDIOVERSION N/A 03/29/2022   Procedure: CARDIOVERSION;  Surgeon: Sanda Klein, MD;  Location: [facility];  Service: Cardiovascular;  Laterality: N/A;   COLONOSCOPY  2004 ; 2007; & 05-29-2012   last one with propofol and hemorroidectomy with sclerosing   COLONOSCOPY  09/05/2019   FINGER SURGERY  1994   repair/ reconstruction right index crush injury   INGUINAL HERNIA REPAIR Bilateral age 63   SHOULDER ARTHROSCOPY WITH LABRAL REPAIR Right 12/31/2013   Procedure: RIGHT SHOULDER ARTHROSCOPY WITH LABRAL REPAIR ;  Surgeon: Sydnee Cabal, MD;  Location: [facility];  Service: Orthopedics;  Laterality: Right;   UPPER GI ENDOSCOPY  2015   esophageal dilation    Current Outpatient Medications  Medication Sig Dispense Refill   apixaban (ELIQUIS) 5 MG TABS tablet Take 1 tablet (5 mg total) by mouth 2 (two) times daily. 60 tablet 2   azelastine (ASTELIN) 0.1 % nasal spray Place 2 sprays into both nostrils daily as needed (sinus symptoms). Use in each nostril as directed     dapagliflozin propanediol (FARXIGA) 10 MG TABS tablet Take 1  tablet (10 mg total) by mouth daily. 30 tablet 2   FERROUS SULFATE PO Take 1 tablet by mouth in the morning.     fluticasone (FLONASE) 50 MCG/ACT nasal spray Place 2 sprays into both nostrils daily as needed (sinus symptoms).     fluticasone-salmeterol (ADVAIR HFA) 45-21 MCG/ACT inhaler Inhale 2 puffs into the lungs 2 (two) times daily. 12 g 12   metoprolol succinate (TOPROL XL) 100 MG 24 hr tablet Take 1 tablet (100 mg total) by mouth daily. Take with or immediately following a meal. 30 tablet 3   Multiple Vitamin (MULTIVITAMIN) tablet Take 1 tablet by mouth in the morning.     Multiple Vitamins-Minerals (IMMUNE SUPPORT PO) Take 1 tablet by mouth in the morning.     omeprazole (PRILOSEC) 40 MG capsule TAKE ONE CAPSULE BY MOUTH TWICE A DAY (Patient taking differently: Take 40 mg by mouth in the morning.) 180 capsule 1   valACYclovir (VALTREX) 1000 MG tablet TAKE ONE TABLET BY MOUTH DAILY (Patient taking differently: Take 500 mg by mouth See admin instructions. 500 mg 2-3 times weekly as preventative) 90 tablet 0   No current facility-administered medications for this encounter.    No Known Allergies  Social History   Socioeconomic History   Marital status: Divorced    Spouse name: Not on file   Number of children: 0   Years of education: Not on file   Highest education level: Master's degree (e.g., MA, MS, MEng, MEd, MSW, MBA)  Occupational History   Occupation: landscaper-self  employed  Tobacco Use   Smoking status: Never   Smokeless tobacco: Never   Tobacco comments:    Never smoke 03/09/22  Vaping Use   Vaping Use: Never used  Substance and Sexual Activity   Alcohol use: Yes    Alcohol/week: 6.0 standard drinks of alcohol    Types: 6 Standard drinks or equivalent per week    Comment: 6 drinks a week 03/09/22   Drug use: Not Currently    Types: Marijuana    Comment: routine use of marijuana, occasional mushrooms   Sexual activity: Not on file  Other Topics Concern   Not  on file  Social History Narrative   Not on file   Social Determinants of Health   Financial Resource Strain: Low Risk  (03/01/2022)   Overall Financial Resource Strain (CARDIA)    Difficulty of Paying Living Expenses: Not very hard  Food Insecurity: No Food Insecurity (02/28/2022)   Hunger Vital Sign    Worried About Running Out of Food in the Last Year: Never true    Ran Out of Food in the Last Year: Never true  Transportation Needs: No Transportation Needs (03/01/2022)   PRAPARE - Hydrologist (Medical): No    Lack of Transportation (Non-Medical): No  Physical Activity: Not on file  Stress: Not on file  Social Connections: Not on file  Intimate Partner Violence:  Not on file     ROS- All systems are reviewed and negative except as per the HPI above.  Physical Exam: Vitals:   04/07/22 1459  BP: (!) 124/94  Pulse: (!) 135  Weight: 91.2 kg  Height: 6\' 1"  (1.854 m)     GEN- The patient is a well appearing male, alert and oriented x 3 today.   HEENT-head normocephalic, atraumatic, sclera clear, conjunctiva pink, hearing intact, trachea midline. Lungs- Clear to ausculation bilaterally, normal work of breathing Heart- irregular rate and rhythm, no murmurs, rubs or gallops  GI- soft, NT, ND, + BS Extremities- no clubbing, cyanosis, or edema MS- no significant deformity or atrophy Skin- no rash or lesion Psych- euthymic mood, full affect Neuro- strength and sensation are intact   Wt Readings from Last 3 Encounters:  04/07/22 91.2 kg  03/29/22 90.7 kg  03/14/22 89.8 kg    EKG today demonstrates  Afib with RVR Vent. rate 135 BPM PR interval * ms QRS duration 94 ms QT/QTcB 300/450 ms  Echo 02/28/22 demonstrated   1. Left ventricular ejection fraction, by estimation, is 40 to 45%. The  left ventricle has mildly decreased function. The left ventricle  demonstrates global hypokinesis. Left ventricular diastolic function could  not be  evaluated.   2. Right ventricular systolic function is mildly reduced. The right  ventricular size is normal. There is normal pulmonary artery systolic  pressure.   3. Left atrial size was mildly dilated.   4. Right atrial size was mildly dilated.   5. The mitral valve is normal in structure. No evidence of mitral valve  regurgitation. No evidence of mitral stenosis.   6. The aortic valve is normal in structure. Aortic valve regurgitation is  not visualized. No aortic stenosis is present.   7. The inferior vena cava is dilated in size with >50% respiratory  variability, suggesting right atrial pressure of 8 mmHg.   Epic records are reviewed at length today  CHA2DS2-VASc Score = 1  The patient's score is based upon: CHF History: 1 HTN History: 0 Diabetes History: 0 Stroke History: 0 Vascular Disease History: 0 Age Score: 0 Gender Score: 0       ASSESSMENT AND PLAN: 1. Persistent Atrial Fibrillation (ICD10:  I48.19) The patient's CHA2DS2-VASc score is 1, indicating a 0.6% annual risk of stroke.   S/p DCCV 03/29/22 which was unsuccessful We discussed rhythm control options today. AAD options limited with reduced EF.  We discussed dofetilide, amiodarone, and ablation. Long term, patient agreeable to consultation with EP to discuss ablation. We discussed using an AAD as a bridge to ablation given his CHF and rapid rates. He would like to avoid the hospitalization with dofetilide. Will start amiodarone 200 mg BID x 4 weeks then decrease to once daily. If he does not convert chemically, would plan for DCCV after loading.  Continue Eliquis 5 mg BID Continue Toprol 100 mg daily  2. Acute systolic CHF EF 40-10% Fluid status appears stable today. Continue Farxiga and metoprolol  Will need repeat echo once back in [place].   3. OSA Has not tolerated CPAP in the past He is working with PCP to get referral for Inspire.   Follow up in the AF clinic in 2 weeks for ECG. EP for ablation  consultation.    [facility] [address] [place], [place] [address] [phone] 04/07/2022 3:11 PM

## 2022-04-07 NOTE — Patient Instructions (Signed)
Start Amiodarone 200mg  twice a day for 1 month then reduce to once a day with food

## 2022-04-21 ENCOUNTER — Encounter (HOSPITAL_COMMUNITY): Payer: BC Managed Care – PPO | Admitting: Physician Assistant

## 2022-04-27 ENCOUNTER — Other Ambulatory Visit: Payer: Self-pay

## 2022-04-27 DIAGNOSIS — G4730 Sleep apnea, unspecified: Secondary | ICD-10-CM

## 2022-04-28 ENCOUNTER — Ambulatory Visit (HOSPITAL_COMMUNITY)
Admission: RE | Admit: 2022-04-28 | Discharge: 2022-04-28 | Disposition: A | Discharge status: Home / Self Care | Payer: BC Managed Care – PPO | Source: Ambulatory Visit | Attending: Physician Assistant | Admitting: Physician Assistant

## 2022-04-28 DIAGNOSIS — I4819 Other persistent atrial fibrillation: Secondary | ICD-10-CM | POA: Diagnosis not present

## 2022-04-28 DIAGNOSIS — Z79899 Other long term (current) drug therapy: Secondary | ICD-10-CM | POA: Diagnosis not present

## 2022-04-28 NOTE — Progress Notes (Signed)
Patient returns for ECG after starting amiodarone. ECG shows:  Coarse afib vs atypical atrial flutter with variable block Vent. rate 102 BPM PR interval * ms QRS duration 98 ms QT/QTcB 364/474 ms  We discussed arranging repeat DCCV now that he is loading on amiodarone. Patient declined stating that it would be "a waste of time". He is frustrated that he cannot get a sooner ablation. He would like to keep his appointment with Dr Myles Gip as scheduled.

## 2022-05-06 ENCOUNTER — Other Ambulatory Visit: Payer: Self-pay | Admitting: Family Medicine

## 2022-05-15 ENCOUNTER — Other Ambulatory Visit (HOSPITAL_COMMUNITY): Payer: Self-pay | Admitting: Physician Assistant

## 2022-05-19 ENCOUNTER — Encounter: Payer: Self-pay | Admitting: Cardiovascular Disease

## 2022-05-19 ENCOUNTER — Ambulatory Visit: Payer: BC Managed Care – PPO | Attending: Cardiovascular Disease | Admitting: Cardiovascular Disease

## 2022-05-19 VITALS — BP 160/84 | HR 89 | Ht 73.0 in | Wt 205.0 lb

## 2022-05-19 DIAGNOSIS — I4819 Other persistent atrial fibrillation: Secondary | ICD-10-CM

## 2022-05-19 NOTE — Progress Notes (Signed)
Electrophysiology Office Note:    Date:  05/19/2022   ID:  Jacob Buchanan, DOB 09-Mar-1970, MRN [id number]  PCP:  Vivi Barrack, MD   [place] Providers Cardiologist:  None     Referring MD: Oliver Barre, PA   History of Present Illness:    Jacob Buchanan is a 52 y.o. male with a hx listed below, significant for AF, CHFrEF, referred for arrhythmia management.  He was diagnosed with atrial fibrillation at a young age, over 69 years ago.  He had a recurrence in December 2023 when he presented to the ER with respiratory symptoms and tested positive for RSV.  He was placed on a diltiazem drip initially and transitioned to metoprolol.  Echocardiogram showed an EF of 40 to 45%. DCCV on 03/29/22 was unsuccessful.  In follow-up in the A-fib clinic, the patient expressed interest in ablation.  Amiodarone was started as a bridge to the procedure.  Past Medical History:  Diagnosis Date   Anemia    Atrial fibrillation [facility])    ED (erectile dysfunction)    Esophageal stricture    GERD (gastroesophageal reflux disease)    Rosanna Randy syndrome    HSV (herpes simplex virus) anogenital infection    Hyperlipidemia    Incomplete right bundle branch block    Partial tear of right rotator cuff     Past Surgical History:  Procedure Laterality Date   CARDIOVERSION N/A 03/29/2022   Procedure: CARDIOVERSION;  Surgeon: Sanda Klein, MD;  Location: [facility];  Service: Cardiovascular;  Laterality: N/A;   COLONOSCOPY  2004 ; 2007; & 05-29-2012   last one with propofol and hemorroidectomy with sclerosing   COLONOSCOPY  09/05/2019   FINGER SURGERY  1994   repair/ reconstruction right index crush injury   INGUINAL HERNIA REPAIR Bilateral age 35   SHOULDER ARTHROSCOPY WITH LABRAL REPAIR Right 12/31/2013   Procedure: RIGHT SHOULDER ARTHROSCOPY WITH LABRAL REPAIR ;  Surgeon: Sydnee Cabal, MD;  Location: [facility];  Service: Orthopedics;  Laterality: Right;   UPPER  GI ENDOSCOPY  2015   esophageal dilation    Current Medications: Current Meds  Medication Sig   amiodarone (PACERONE) 200 MG tablet Take 1 tablet (200 mg total) by mouth daily.   apixaban (ELIQUIS) 5 MG TABS tablet Take 1 tablet (5 mg total) by mouth 2 (two) times daily.   azelastine (ASTELIN) 0.1 % nasal spray Place 2 sprays into both nostrils daily as needed (sinus symptoms). Use in each nostril as directed   dapagliflozin propanediol (FARXIGA) 10 MG TABS tablet Take 1 tablet (10 mg total) by mouth daily.   FERROUS SULFATE PO Take 1 tablet by mouth in the morning.   fluticasone (FLONASE) 50 MCG/ACT nasal spray Place 2 sprays into both nostrils daily as needed (sinus symptoms).   metoprolol succinate (TOPROL XL) 100 MG 24 hr tablet Take 1 tablet (100 mg total) by mouth daily. Take with or immediately following a meal.   Multiple Vitamin (MULTIVITAMIN) tablet Take 1 tablet by mouth in the morning.   Multiple Vitamins-Minerals (IMMUNE SUPPORT PO) Take 1 tablet by mouth in the morning.   omeprazole (PRILOSEC) 40 MG capsule TAKE 1 CAPSULE BY MOUTH TWICE A DAY   valACYclovir (VALTREX) 1000 MG tablet TAKE ONE TABLET BY MOUTH DAILY (Patient taking differently: Take 500 mg by mouth See admin instructions. 500 mg 2-3 times weekly as preventative)     Allergies:   Patient has no known allergies.   Social and  Family History: Reviewed in Epic  ROS:   Please see the history of present illness.    All other systems reviewed and are negative.  EKGs/Labs/Other Studies Reviewed Today:    Cardiac Studies & Procedures       ECHOCARDIOGRAM  ECHOCARDIOGRAM COMPLETE 02/28/2022  Narrative ECHOCARDIOGRAM REPORT    Patient Name:   Jacob Buchanan Date of Exam: 02/28/2022 Medical Rec #:  [id number]          Height:       73.0 in Accession #:    [id number]         Weight:       200.8 lb Date of Birth:  1970-06-01          BSA:          2.155 m Patient Age:    64 years           BP:            115/77 mmHg Patient Gender: M                  HR:           94 bpm. Exam Location:  Inpatient  Procedure: 2D Echo  Indications:    atrial fibrillation  History:        Patient has no prior history of Echocardiogram examinations. Arrythmias:Atrial Fibrillation; Risk Factors:Dyslipidemia.  Sonographer:    Johny Chess RDCS Referring Phys: [place]   1. Left ventricular ejection fraction, by estimation, is 40 to 45%. The left ventricle has mildly decreased function. The left ventricle demonstrates global hypokinesis. Left ventricular diastolic function could not be evaluated. 2. Right ventricular systolic function is mildly reduced. The right ventricular size is normal. There is normal pulmonary artery systolic pressure. 3. Left atrial size was mildly dilated. 4. Right atrial size was mildly dilated. 5. The mitral valve is normal in structure. No evidence of mitral valve regurgitation. No evidence of mitral stenosis. 6. The aortic valve is normal in structure. Aortic valve regurgitation is not visualized. No aortic stenosis is present. 7. The inferior vena cava is dilated in size with >50% respiratory variability, suggesting right atrial pressure of 8 mmHg.  FINDINGS Left Ventricle: Left ventricular ejection fraction, by estimation, is 40 to 45%. The left ventricle has mildly decreased function. The left ventricle demonstrates global hypokinesis. The left ventricular internal cavity size was normal in size. There is no left ventricular hypertrophy. Left ventricular diastolic function could not be evaluated due to atrial fibrillation. Left ventricular diastolic function could not be evaluated.  Right Ventricle: The right ventricular size is normal. No increase in right ventricular wall thickness. Right ventricular systolic function is mildly reduced. There is normal pulmonary artery systolic pressure. The tricuspid regurgitant velocity is 1.86 m/s, and with an  assumed right atrial pressure of 8 mmHg, the estimated right ventricular systolic pressure is 123XX123 mmHg.  Left Atrium: Left atrial size was mildly dilated.  Right Atrium: Right atrial size was mildly dilated.  Pericardium: There is no evidence of pericardial effusion.  Mitral Valve: The mitral valve is normal in structure. No evidence of mitral valve regurgitation. No evidence of mitral valve stenosis.  Tricuspid Valve: The tricuspid valve is normal in structure. Tricuspid valve regurgitation is not demonstrated. No evidence of tricuspid stenosis.  Aortic Valve: The aortic valve is normal in structure. Aortic valve regurgitation is not visualized. No aortic stenosis is present.  Pulmonic Valve: The pulmonic valve was normal  in structure. Pulmonic valve regurgitation is not visualized. No evidence of pulmonic stenosis.  Aorta: The aortic root is normal in size and structure.  Venous: The inferior vena cava is dilated in size with greater than 50% respiratory variability, suggesting right atrial pressure of 8 mmHg.  IAS/Shunts: No atrial level shunt detected by color flow Doppler.   LEFT VENTRICLE PLAX 2D LVOT diam:     2.30 cm LVOT Area:     4.15 cm   RIGHT VENTRICLE             IVC RV S prime:     10.90 cm/s  IVC diam: 2.30 cm TAPSE (M-mode): 1.6 cm  LEFT ATRIUM             Index        RIGHT ATRIUM           Index LA Vol (A2C):   80.6 ml 37.39 ml/m  RA Area:     22.90 cm LA Vol (A4C):   49.8 ml 23.10 ml/m  RA Volume:   70.70 ml  32.80 ml/m LA Biplane Vol: 65.7 ml 30.48 ml/m  AORTA Ao Root diam: 3.50 cm Ao Asc diam:  3.10 cm  TRICUSPID VALVE TR Peak grad:   13.8 mmHg TR Vmax:        186.00 cm/s  SHUNTS Systemic Diam: 2.30 cm  Skeet Latch MD Electronically signed by Skeet Latch MD Signature Date/Time: 02/28/2022/3:58:34 PM    Final             EKG:  Last EKG results: today - AF with V-rate 89 bpm   Recent Labs: 10/26/2021: ALT  41 02/27/2022: B Natriuretic Peptide 273.5; Magnesium 2.1 03/01/2022: TSH 0.543 03/28/2022: BUN 13; Creatinine, Ser 1.29; Hemoglobin 17.7; Platelets 172; Potassium 4.9; Sodium 139     Physical Exam:    VS:  BP (!) 160/84   Pulse 89   Ht '6\' 1"'$  (1.854 m)   Wt 205 lb (93 kg)   SpO2 99%   BMI 27.05 kg/m     Wt Readings from Last 3 Encounters:  05/19/22 205 lb (93 kg)  04/07/22 201 lb (91.2 kg)  03/29/22 200 lb (90.7 kg)     GEN: Well nourished, well developed in no acute distress CARDIAC: iRRR, no murmurs, rubs, gallops RESPIRATORY:  Normal work of breathing MUSCULOSKELETAL: no edema    ASSESSMENT & PLAN:    Persistent atrial fibrillation: symptomatic with some mild fatigue. Patient wishes to pursue rhythm control. We discussed options. He prefers an early invasive strategy. Now that he is on amiodarone, will schedule another DCCV to help prevent negative remodeling. CHF with mid-range EF: EF 40-45%, likely tachycardia-mediated OSA: has not tolerated CPAP. Is hoping to get a referral for inspire    We discussed the indication, rationale, logistics, anticipated benefits, and potential risks of the ablation procedure including but not limited to -- bleed at the groin access site, chest pain, damage to nearby organs such as the diaphragm, lungs, or esophagus, need for a drainage tube, or prolonged hospitalization. I explained that the risk for stroke, heart attack, need for open chest surgery, or even death is very low but not zero. he  expressed understanding and wishes to proceed.     Medication Adjustments/Labs and Tests Ordered: Current medicines are reviewed at length with the patient today.  Concerns regarding medicines are outlined above.  Orders Placed This Encounter  Procedures   EKG 12-Lead   No orders of the defined  types were placed in this encounter.    Signed, Melida Quitter, MD  05/19/2022 11:39 AM    [place]

## 2022-05-19 NOTE — H&P (View-Only) (Signed)
Electrophysiology Office Note:    Date:  05/19/2022   ID:  Jacob Buchanan, DOB 03/18/1970, MRN [id number]  PCP:  Parker, Caleb M, MD   [facility] Providers Cardiologist:  None     Referring MD: Fenton, Clint R, PA   History of Present Illness:    Jacob Buchanan is a 52 y.o. male with a hx listed below, significant for AF, CHFrEF, referred for arrhythmia management.  He was diagnosed with atrial fibrillation at a young age, over 20 years ago.  He had a recurrence in December 2023 when he presented to the ER with respiratory symptoms and tested positive for RSV.  He was placed on a diltiazem drip initially and transitioned to metoprolol.  Echocardiogram showed an EF of 40 to 45%. DCCV on 03/29/22 was unsuccessful.  In follow-up in the A-fib clinic, the patient expressed interest in ablation.  Amiodarone was started as a bridge to the procedure.  Past Medical History:  Diagnosis Date   Anemia    Atrial fibrillation (HCC)    ED (erectile dysfunction)    Esophageal stricture    GERD (gastroesophageal reflux disease)    Gilbert syndrome    HSV (herpes simplex virus) anogenital infection    Hyperlipidemia    Incomplete right bundle branch block    Partial tear of right rotator cuff     Past Surgical History:  Procedure Laterality Date   CARDIOVERSION N/A 03/29/2022   Procedure: CARDIOVERSION;  Surgeon: Croitoru, Mihai, MD;  Location: [facility];  Service: Cardiovascular;  Laterality: N/A;   COLONOSCOPY  2004 ; 2007; & 05-29-2012   last one with propofol and hemorroidectomy with sclerosing   COLONOSCOPY  09/05/2019   FINGER SURGERY  1994   repair/ reconstruction right index crush injury   INGUINAL HERNIA REPAIR Bilateral age 1   SHOULDER ARTHROSCOPY WITH LABRAL REPAIR Right 12/31/2013   Procedure: RIGHT SHOULDER ARTHROSCOPY WITH LABRAL REPAIR ;  Surgeon: Robert Collins, MD;  Location: [facility];  Service: Orthopedics;  Laterality: Right;   UPPER  GI ENDOSCOPY  2015   esophageal dilation    Current Medications: Current Meds  Medication Sig   amiodarone (PACERONE) 200 MG tablet Take 1 tablet (200 mg total) by mouth daily.   apixaban (ELIQUIS) 5 MG TABS tablet Take 1 tablet (5 mg total) by mouth 2 (two) times daily.   azelastine (ASTELIN) 0.1 % nasal spray Place 2 sprays into both nostrils daily as needed (sinus symptoms). Use in each nostril as directed   dapagliflozin propanediol (FARXIGA) 10 MG TABS tablet Take 1 tablet (10 mg total) by mouth daily.   FERROUS SULFATE PO Take 1 tablet by mouth in the morning.   fluticasone (FLONASE) 50 MCG/ACT nasal spray Place 2 sprays into both nostrils daily as needed (sinus symptoms).   metoprolol succinate (TOPROL XL) 100 MG 24 hr tablet Take 1 tablet (100 mg total) by mouth daily. Take with or immediately following a meal.   Multiple Vitamin (MULTIVITAMIN) tablet Take 1 tablet by mouth in the morning.   Multiple Vitamins-Minerals (IMMUNE SUPPORT PO) Take 1 tablet by mouth in the morning.   omeprazole (PRILOSEC) 40 MG capsule TAKE 1 CAPSULE BY MOUTH TWICE A DAY   valACYclovir (VALTREX) 1000 MG tablet TAKE ONE TABLET BY MOUTH DAILY (Patient taking differently: Take 500 mg by mouth See admin instructions. 500 mg 2-3 times weekly as preventative)     Allergies:   Patient has no known allergies.   Social and   Family History: Reviewed in Epic  ROS:   Please see the history of present illness.    All other systems reviewed and are negative.  EKGs/Labs/Other Studies Reviewed Today:    Cardiac Studies & Procedures       ECHOCARDIOGRAM  ECHOCARDIOGRAM COMPLETE 02/28/2022  Narrative ECHOCARDIOGRAM REPORT    Patient Name:   Jacob Buchanan Date of Exam: 02/28/2022 Medical Rec #:  [id number]          Height:       73.0 in Accession #:    [id number]         Weight:       200.8 lb Date of Birth:  10/24/1970          BSA:          2.155 m Patient Age:    51 years           BP:            115/77 mmHg Patient Gender: M                  HR:           94 bpm. Exam Location:  Inpatient  Procedure: 2D Echo  Indications:    atrial fibrillation  History:        Patient has no prior history of Echocardiogram examinations. Arrythmias:Atrial Fibrillation; Risk Factors:Dyslipidemia.  Sonographer:    Lauren Pennington RDCS Referring Phys: [id number] JENNIFER YATES  IMPRESSIONS   1. Left ventricular ejection fraction, by estimation, is 40 to 45%. The left ventricle has mildly decreased function. The left ventricle demonstrates global hypokinesis. Left ventricular diastolic function could not be evaluated. 2. Right ventricular systolic function is mildly reduced. The right ventricular size is normal. There is normal pulmonary artery systolic pressure. 3. Left atrial size was mildly dilated. 4. Right atrial size was mildly dilated. 5. The mitral valve is normal in structure. No evidence of mitral valve regurgitation. No evidence of mitral stenosis. 6. The aortic valve is normal in structure. Aortic valve regurgitation is not visualized. No aortic stenosis is present. 7. The inferior vena cava is dilated in size with >50% respiratory variability, suggesting right atrial pressure of 8 mmHg.  FINDINGS Left Ventricle: Left ventricular ejection fraction, by estimation, is 40 to 45%. The left ventricle has mildly decreased function. The left ventricle demonstrates global hypokinesis. The left ventricular internal cavity size was normal in size. There is no left ventricular hypertrophy. Left ventricular diastolic function could not be evaluated due to atrial fibrillation. Left ventricular diastolic function could not be evaluated.  Right Ventricle: The right ventricular size is normal. No increase in right ventricular wall thickness. Right ventricular systolic function is mildly reduced. There is normal pulmonary artery systolic pressure. The tricuspid regurgitant velocity is 1.86 m/s, and with an  assumed right atrial pressure of 8 mmHg, the estimated right ventricular systolic pressure is 21.8 mmHg.  Left Atrium: Left atrial size was mildly dilated.  Right Atrium: Right atrial size was mildly dilated.  Pericardium: There is no evidence of pericardial effusion.  Mitral Valve: The mitral valve is normal in structure. No evidence of mitral valve regurgitation. No evidence of mitral valve stenosis.  Tricuspid Valve: The tricuspid valve is normal in structure. Tricuspid valve regurgitation is not demonstrated. No evidence of tricuspid stenosis.  Aortic Valve: The aortic valve is normal in structure. Aortic valve regurgitation is not visualized. No aortic stenosis is present.  Pulmonic Valve: The pulmonic valve was normal   in structure. Pulmonic valve regurgitation is not visualized. No evidence of pulmonic stenosis.  Aorta: The aortic root is normal in size and structure.  Venous: The inferior vena cava is dilated in size with greater than 50% respiratory variability, suggesting right atrial pressure of 8 mmHg.  IAS/Shunts: No atrial level shunt detected by color flow Doppler.   LEFT VENTRICLE PLAX 2D LVOT diam:     2.30 cm LVOT Area:     4.15 cm   RIGHT VENTRICLE             IVC RV S prime:     10.90 cm/s  IVC diam: 2.30 cm TAPSE (M-mode): 1.6 cm  LEFT ATRIUM             Index        RIGHT ATRIUM           Index LA Vol (A2C):   80.6 ml 37.39 ml/m  RA Area:     22.90 cm LA Vol (A4C):   49.8 ml 23.10 ml/m  RA Volume:   70.70 ml  32.80 ml/m LA Biplane Vol: 65.7 ml 30.48 ml/m  AORTA Ao Root diam: 3.50 cm Ao Asc diam:  3.10 cm  TRICUSPID VALVE TR Peak grad:   13.8 mmHg TR Vmax:        186.00 cm/s  SHUNTS Systemic Diam: 2.30 cm  Tiffany Mif[place]in[place] MD Electronically signed by Tiffany Mason MD Signature Date/Time: 02/28/2022/3:58:34 PM    Final             EKG:  Last EKG results: today - AF with V-rate 89 bpm   Recent Labs: 10/26/2021: ALT  41 02/27/2022: B Natriuretic Peptide 273.5; Magnesium 2.1 03/01/2022: TSH 0.543 03/28/2022: BUN 13; Creatinine, Ser 1.29; Hemoglobin 17.7; Platelets 172; Potassium 4.9; Sodium 139     Physical Exam:    VS:  BP (!) 160/84   Pulse 89   Ht 6' 1" (1.854 m)   Wt 205 lb (93 kg)   SpO2 99%   BMI 27.05 kg/m     Wt Readings from Last 3 Encounters:  05/19/22 205 lb (93 kg)  04/07/22 201 lb (91.2 kg)  03/29/22 200 lb (90.7 kg)     GEN: Well nourished, well developed in no acute distress CARDIAC: iRRR, no murmurs, rubs, gallops RESPIRATORY:  Normal work of breathing MUSCULOSKELETAL: no edema    ASSESSMENT & PLAN:    Persistent atrial fibrillation: symptomatic with some mild fatigue. Patient wishes to pursue rhythm control. We discussed options. He prefers an early invasive strategy. Now that he is on amiodarone, will schedule another DCCV to help prevent negative remodeling. CHF with mid-range EF: EF 40-45%, likely tachycardia-mediated OSA: has not tolerated CPAP. Is hoping to get a referral for inspire    We discussed the indication, rationale, logistics, anticipated benefits, and potential risks of the ablation procedure including but not limited to -- bleed at the groin access site, chest pain, damage to nearby organs such as the diaphragm, lungs, or esophagus, need for a drainage tube, or prolonged hospitalization. I explained that the risk for stroke, heart attack, need for open chest surgery, or even death is very low but not zero. he  expressed understanding and wishes to proceed.     Medication Adjustments/Labs and Tests Ordered: Current medicines are reviewed at length with the patient today.  Concerns regarding medicines are outlined above.  Orders Placed This Encounter  Procedures   EKG 12-Lead   No orders of the defined   types were placed in this encounter.    Signed, Sugey Trevathan E Jiyaan Steinhauser, MD  05/19/2022 11:39 AM    [facility] 

## 2022-05-19 NOTE — Patient Instructions (Addendum)
Medication Instructions:  Your physician recommends that you continue on your current medications as directed. Please refer to the Current Medication list given to you today.   *If you need a refill on your cardiac medications before your next appointment, please call your pharmacy*   Lab Work: 1 week prior to CT scan: CBC, BMET (you do not need to be fasting) You may come to the lab any time between 7:30am and 4:30pm. If you have labs (blood work) drawn today and your tests are completely normal, you will receive your results only by: [place] (if you have MyChart) OR A paper copy in the mail If you have any lab test that is abnormal or we need to change your treatment, we will call you to review the results.   Testing/Procedures: Your physician has recommended that you have a Cardioversion (DCCV). Electrical Cardioversion uses a jolt of electricity to your heart either through paddles or wired patches attached to your chest. This is a controlled, usually prescheduled, procedure. Defibrillation is done under light anesthesia in the hospital, and you usually go home the day of the procedure. This is done to get your heart back into a normal rhythm. You are not awake for the procedure. Please see the instruction sheet given to you today.   Your physician has requested that you have cardiac CT. Cardiac computed tomography (CT) is a painless test that uses an x-ray machine to take clear, detailed pictures of your heart. For further information please visit [facility]uge[facility]iest[facility]tn. Please follow instruction sheet as given.   Your physician has recommended that you have an ablation. Catheter ablation is a medical procedure used to treat some cardiac arrhythmias (irregular heartbeats). During catheter ablation, a long, thin, flexible tube is put into a blood vessel in your groin (upper thigh), or neck. This tube is called an ablation catheter. It is then guided to your heart through the blood vessel.  Radio frequency waves destroy small areas of heart tissue where abnormal heartbeats may cause an arrhythmia to start.    The EP procedure scheduler, April G, will call you to schedule your ablation and review instructions with you. Please allow 2-3 weeks for her to contact you.   Follow-Up: At [facility], you and your health needs are our priority.  As part of our continuing mission to provide you with exceptional heart care, we have created designated Provider Care Teams.  These Care Teams include your primary Cardiologist (physician) and Advanced Practice Providers (APPs -  Physician Assistants and Nurse Practitioners) who all work together to provide you with the care you need, when you need it.   Your next appointment:   4 week(s) after your ablation   Provider:   You will follow up in the [facility] located at [facility]. Your provider will be: Roderic Palau, NP or Clint R. Fenton, PA-C   Cardioversion Instruction You are scheduled for a Cardioversion on Tuesday June 07, 2022 with Dr. Lyman Bishop.  Please arrive at the [facility] (Main Entrance A) at [facility]: [address] [place], [place] [address] at  8:00 am.  DIET: Nothing to eat or drink after midnight except a sip of water with medications (see medication instructions below)  FYI: For your safety, and to allow [place] to monitor your vital signs accurately during the surgery/procedure we request that   if you have artificial nails, gel coating, SNS etc. Please have those removed prior to your surgery/procedure. Not having  the nail coverings /polish removed may result in cancellation or delay of your surgery/procedure.   Medication Instructions: Continue your anticoagulant: You will need to continue your anticoagulant after your procedure until you are told by your Provider that it is safe to stop   Labs: CBC, BMET will be drawn prior to your cardioversion, please arrive at [facility] at 8:00 am to allow time for labs to be drawn.  You must have a responsible person to drive you home and stay in the waiting area during your procedure. Failure to do so could result in cancellation.  Bring your insurance cards.  *Special Note: Every effort is made to have your procedure done on time. Occasionally there are emergencies that occur at the hospital that may cause delays. Please be patient if a delay does occur.

## 2022-05-23 ENCOUNTER — Telehealth: Payer: Self-pay

## 2022-05-23 DIAGNOSIS — I4819 Other persistent atrial fibrillation: Secondary | ICD-10-CM

## 2022-05-23 NOTE — Telephone Encounter (Signed)
Pt is scheduled for 08/31/22...  Lab appt: 08/09/22.Marland KitchenMarland Kitchen

## 2022-06-01 DIAGNOSIS — R0683 Snoring: Secondary | ICD-10-CM | POA: Insufficient documentation

## 2022-06-01 DIAGNOSIS — G4733 Obstructive sleep apnea (adult) (pediatric): Secondary | ICD-10-CM | POA: Insufficient documentation

## 2022-06-07 ENCOUNTER — Ambulatory Visit (HOSPITAL_COMMUNITY): Payer: BC Managed Care – PPO | Admitting: Certified Registered"

## 2022-06-07 ENCOUNTER — Other Ambulatory Visit: Payer: Self-pay

## 2022-06-07 ENCOUNTER — Ambulatory Visit (HOSPITAL_COMMUNITY)
Admission: RE | Admit: 2022-06-07 | Discharge: 2022-06-07 | Disposition: A | Discharge status: Home / Self Care | Payer: BC Managed Care – PPO | Attending: Internal Medicine | Admitting: Internal Medicine

## 2022-06-07 ENCOUNTER — Encounter (HOSPITAL_COMMUNITY): Payer: Self-pay | Admitting: Internal Medicine

## 2022-06-07 ENCOUNTER — Encounter (HOSPITAL_COMMUNITY)
Admission: RE | Disposition: A | Discharge status: Home / Self Care | Payer: Self-pay | Source: Home / Self Care | Attending: Internal Medicine

## 2022-06-07 DIAGNOSIS — G4733 Obstructive sleep apnea (adult) (pediatric): Secondary | ICD-10-CM | POA: Insufficient documentation

## 2022-06-07 DIAGNOSIS — I4510 Unspecified right bundle-branch block: Secondary | ICD-10-CM | POA: Diagnosis not present

## 2022-06-07 DIAGNOSIS — I5022 Chronic systolic (congestive) heart failure: Secondary | ICD-10-CM | POA: Insufficient documentation

## 2022-06-07 DIAGNOSIS — Z7984 Long term (current) use of oral hypoglycemic drugs: Secondary | ICD-10-CM | POA: Diagnosis not present

## 2022-06-07 DIAGNOSIS — R519 Headache, unspecified: Secondary | ICD-10-CM | POA: Diagnosis not present

## 2022-06-07 DIAGNOSIS — I4819 Other persistent atrial fibrillation: Secondary | ICD-10-CM | POA: Diagnosis not present

## 2022-06-07 DIAGNOSIS — J45909 Unspecified asthma, uncomplicated: Secondary | ICD-10-CM | POA: Insufficient documentation

## 2022-06-07 DIAGNOSIS — I4891 Unspecified atrial fibrillation: Secondary | ICD-10-CM | POA: Diagnosis not present

## 2022-06-07 DIAGNOSIS — Z7901 Long term (current) use of anticoagulants: Secondary | ICD-10-CM | POA: Insufficient documentation

## 2022-06-07 DIAGNOSIS — E785 Hyperlipidemia, unspecified: Secondary | ICD-10-CM | POA: Diagnosis not present

## 2022-06-07 DIAGNOSIS — K219 Gastro-esophageal reflux disease without esophagitis: Secondary | ICD-10-CM | POA: Diagnosis not present

## 2022-06-07 HISTORY — PX: CARDIOVERSION: SHX1299

## 2022-06-07 LAB — POCT I-STAT, CHEM 8
BUN: 20 mg/dL (ref 6–20)
Calcium, Ion: 1.14 mmol/L — ABNORMAL LOW (ref 1.15–1.40)
Chloride: 102 mmol/L (ref 98–111)
Creatinine, Ser: 1.2 mg/dL (ref 0.61–1.24)
Glucose, Bld: 106 mg/dL — ABNORMAL HIGH (ref 70–99)
HCT: 49 % (ref 39.0–52.0)
Hemoglobin: 16.7 g/dL (ref 13.0–17.0)
Potassium: 4.9 mmol/L (ref 3.5–5.1)
Sodium: 140 mmol/L (ref 135–145)
TCO2: 29 mmol/L (ref 22–32)

## 2022-06-07 SURGERY — CARDIOVERSION
Anesthesia: General

## 2022-06-07 MED ORDER — SODIUM CHLORIDE 0.9 % IV SOLN: INTRAVENOUS | Status: DC

## 2022-06-07 MED ORDER — LIDOCAINE HCL (CARDIAC) PF 100 MG/5ML IV SOSY
PREFILLED_SYRINGE | INTRAVENOUS | Status: DC | PRN
  Administered 2022-06-07: 60 mg via INTRAVENOUS

## 2022-06-07 MED ORDER — PROPOFOL 10 MG/ML IV BOLUS
INTRAVENOUS | Status: DC | PRN
  Administered 2022-06-07: 50 mg via INTRAVENOUS
  Administered 2022-06-07 (×3): 20 mg via INTRAVENOUS

## 2022-06-07 NOTE — Anesthesia Preprocedure Evaluation (Signed)
Anesthesia Evaluation  Patient identified by MRN, date of birth, ID band Patient awake    Reviewed: Allergy & Precautions, NPO status , Patient's Chart, lab work & pertinent test results  History of Anesthesia Complications Negative for: history of anesthetic complications  Airway Mallampati: III  TM Distance: >3 FB Neck ROM: Full    Dental  (+) Dental Advisory Given   Pulmonary asthma    Pulmonary exam normal        Cardiovascular + dysrhythmias Atrial Fibrillation  Rhythm:Irregular Rate:Normal     Neuro/Psych  Headaches    GI/Hepatic Neg liver ROS,GERD  Medicated and Controlled,,  Endo/Other    Renal/GU negative Renal ROS     Musculoskeletal negative musculoskeletal ROS (+)    Abdominal Normal abdominal exam  (+)   Peds  Hematology   Anesthesia Other Findings   Reproductive/Obstetrics                             Anesthesia Physical Anesthesia Plan  ASA: 2  Anesthesia Plan: General   Post-op Pain Management: Minimal or no pain anticipated   Induction: Intravenous  PONV Risk Score and Plan: 2 and Treatment may vary due to age or medical condition  Airway Management Planned: Natural Airway and Nasal Cannula  Additional Equipment: None  Intra-op Plan:   Post-operative Plan:   Informed Consent: I have reviewed the patients History and Physical, chart, labs and discussed the procedure including the risks, benefits and alternatives for the proposed anesthesia with the patient or authorized representative who has indicated his/her understanding and acceptance.       Plan Discussed with: CRNA  Anesthesia Plan Comments:        Anesthesia Quick Evaluation

## 2022-06-07 NOTE — Transfer of Care (Signed)
Immediate Anesthesia Transfer of Care Note  Patient: Jacob Buchanan  Procedure(s) Performed: CARDIOVERSION  Patient Location: PACU and Endoscopy Unit  Anesthesia Type:General  Level of Consciousness: drowsy and patient cooperative  Airway & Oxygen Therapy: Patient Spontanous Breathing and Patient connected to nasal cannula oxygen  Post-op Assessment: Report given to RN and Post -op Vital signs reviewed and stable  Post vital signs: Reviewed and stable  Last Vitals:  Vitals Value Taken Time  BP    Temp    Pulse    Resp    SpO2      Last Pain:  Vitals:   06/07/22 0843  TempSrc: Temporal  PainSc: 0-No pain         Complications: No notable events documented.

## 2022-06-07 NOTE — CV Procedure (Signed)
    CARDIOVERSION NOTE  Procedure: Electrical Cardioversion Indications:  Atrial Fibrillation  Procedure Details:  Consent: Risks of procedure as well as the alternatives and risks of each were explained to the (patient/caregiver).  Consent for procedure obtained.  Time Out: Verified patient identification, verified procedure, site/side was marked, verified correct patient position, special equipment/implants available, medications/allergies/relevent history reviewed, required imaging and test results available.  Performed  Patient placed on cardiac monitor, pulse oximetry, supplemental oxygen as necessary.  Sedation given:  propofol per anesthesia Pacer pads placed anterior and posterior chest.  Cardioverted 3 time(s).  Cardioverted at 150J and 200J x 2 with pressure.  Impression: Findings: Post procedure EKG shows: NSR Complications: None Patient did tolerate procedure well.  Plan: Successful DCCV with 3 stacked shocks to NSR.  Time Spent Directly with the Patient:  30 minutes   [name], MD, [facility], [place] Director of the Advanced Lipid Disorders &  Cardiovascular Risk Reduction Clinic Diplomate of the American Board of Clinical Lipidology Attending Cardiologist  Direct Dial: [phone]  Fax: [phone]  Website:  [email or web address] 06/07/2022, 9:31 AM

## 2022-06-07 NOTE — Interval H&P Note (Signed)
History and Physical Interval Note:  06/07/2022 8:52 AM  Jacob Buchanan  has presented today for surgery, with the diagnosis of AFIB.  The various methods of treatment have been discussed with the patient and family. After consideration of risks, benefits and other options for treatment, the patient has consented to  Procedure(s): CARDIOVERSION (N/A) as a surgical intervention.  The patient's history has been reviewed, patient examined, no change in status, stable for surgery.  I have reviewed the patient's chart and labs.  Questions were answered to the patient's satisfaction.     [name]

## 2022-06-07 NOTE — Anesthesia Postprocedure Evaluation (Signed)
Anesthesia Post Note  Patient: Jacob Buchanan  Procedure(s) Performed: CARDIOVERSION     Patient location during evaluation: Endoscopy Anesthesia Type: General Level of consciousness: awake Pain management: pain level controlled Vital Signs Assessment: post-procedure vital signs reviewed and stable Respiratory status: spontaneous breathing Cardiovascular status: stable Postop Assessment: no apparent nausea or vomiting Anesthetic complications: no   No notable events documented.  Last Vitals:  Vitals:   06/07/22 0943 06/07/22 0953  BP: (!) 117/92 122/88  Pulse: 65 62  Resp: 17 12  Temp: 36.5 C   SpO2: 95% 97%    Last Pain:  Vitals:   06/07/22 0953  TempSrc:   PainSc: 0-No pain                 Huston Foley

## 2022-06-10 ENCOUNTER — Encounter (HOSPITAL_COMMUNITY): Payer: Self-pay | Admitting: Internal Medicine

## 2022-06-21 DIAGNOSIS — G4737 Central sleep apnea in conditions classified elsewhere: Secondary | ICD-10-CM | POA: Diagnosis not present

## 2022-06-21 DIAGNOSIS — G4733 Obstructive sleep apnea (adult) (pediatric): Secondary | ICD-10-CM | POA: Diagnosis not present

## 2022-06-21 DIAGNOSIS — G4736 Sleep related hypoventilation in conditions classified elsewhere: Secondary | ICD-10-CM | POA: Diagnosis not present

## 2022-06-23 ENCOUNTER — Other Ambulatory Visit (HOSPITAL_COMMUNITY): Payer: Self-pay | Admitting: Physician Assistant

## 2022-06-27 DIAGNOSIS — G4736 Sleep related hypoventilation in conditions classified elsewhere: Secondary | ICD-10-CM | POA: Diagnosis not present

## 2022-06-27 DIAGNOSIS — G4737 Central sleep apnea in conditions classified elsewhere: Secondary | ICD-10-CM | POA: Diagnosis not present

## 2022-06-27 DIAGNOSIS — G4733 Obstructive sleep apnea (adult) (pediatric): Secondary | ICD-10-CM | POA: Diagnosis not present

## 2022-07-01 DIAGNOSIS — L720 Epidermal cyst: Secondary | ICD-10-CM | POA: Diagnosis not present

## 2022-07-04 ENCOUNTER — Other Ambulatory Visit: Payer: Self-pay

## 2022-07-04 ENCOUNTER — Telehealth: Payer: Self-pay | Admitting: Family Medicine

## 2022-07-04 NOTE — Telephone Encounter (Signed)
Neither VIAGRA or CIALIS on patient's current med list. Please advise if he can be on both

## 2022-07-04 NOTE — Telephone Encounter (Signed)
Prescription Request  07/04/2022  LOV: 03/14/2022  What is the name of the medication or equipment? sildenafil (VIAGRA) 100 MG tablet  AND tadalafil (CIALIS) 20 MG tablet   Have you contacted your pharmacy to request a refill? No   Which pharmacy would you like this sent to? States he wants rx order emailed to him like last time to [email or web address]   Patient notified that their request is being sent to the clinical staff for review and that they should receive a response within 2 business days.   Please advise at Mobile [phone] (mobile)

## 2022-07-05 ENCOUNTER — Other Ambulatory Visit: Payer: Self-pay | Admitting: *Deleted

## 2022-07-05 MED ORDER — SILDENAFIL CITRATE 100 MG PO TABS: 50.0000 mg | ORAL_TABLET | Freq: Every day | ORAL | 0 refills | Status: AC | PRN

## 2022-07-05 MED ORDER — TADALAFIL 20 MG PO TABS: 10.0000 mg | ORAL_TABLET | Freq: Every day | ORAL | 0 refills | Status: AC | PRN

## 2022-07-05 NOTE — Telephone Encounter (Signed)
Rx Printed, patient notified  Patient stated he been alternation prescription since it works better for him

## 2022-07-05 NOTE — Telephone Encounter (Signed)
Ok to re-order. He has been alternating them in the past. He should not take them on the same day.  [name]. [name], MD 07/05/2022 7:51 AM

## 2022-07-13 DIAGNOSIS — M9901 Segmental and somatic dysfunction of cervical region: Secondary | ICD-10-CM | POA: Diagnosis not present

## 2022-07-13 DIAGNOSIS — M9906 Segmental and somatic dysfunction of lower extremity: Secondary | ICD-10-CM | POA: Diagnosis not present

## 2022-07-13 DIAGNOSIS — M9905 Segmental and somatic dysfunction of pelvic region: Secondary | ICD-10-CM | POA: Diagnosis not present

## 2022-07-13 DIAGNOSIS — M9903 Segmental and somatic dysfunction of lumbar region: Secondary | ICD-10-CM | POA: Diagnosis not present

## 2022-07-14 ENCOUNTER — Other Ambulatory Visit (HOSPITAL_COMMUNITY): Payer: Self-pay | Admitting: Physician Assistant

## 2022-07-14 ENCOUNTER — Other Ambulatory Visit: Payer: Self-pay

## 2022-07-14 MED ORDER — DAPAGLIFLOZIN PROPANEDIOL 10 MG PO TABS: 10.0000 mg | ORAL_TABLET | Freq: Every day | ORAL | 3 refills | Status: DC

## 2022-07-22 ENCOUNTER — Encounter: Payer: Self-pay | Admitting: *Deleted

## 2022-07-27 DIAGNOSIS — M9903 Segmental and somatic dysfunction of lumbar region: Secondary | ICD-10-CM | POA: Diagnosis not present

## 2022-07-27 DIAGNOSIS — M9906 Segmental and somatic dysfunction of lower extremity: Secondary | ICD-10-CM | POA: Diagnosis not present

## 2022-07-27 DIAGNOSIS — M9905 Segmental and somatic dysfunction of pelvic region: Secondary | ICD-10-CM | POA: Diagnosis not present

## 2022-07-27 DIAGNOSIS — M9901 Segmental and somatic dysfunction of cervical region: Secondary | ICD-10-CM | POA: Diagnosis not present

## 2022-07-29 ENCOUNTER — Other Ambulatory Visit: Payer: Self-pay | Admitting: Family Medicine

## 2022-08-08 ENCOUNTER — Other Ambulatory Visit (HOSPITAL_COMMUNITY)
Admission: AD | Admit: 2022-08-08 | Discharge: 2022-08-08 | Disposition: A | Discharge status: Home / Self Care | Payer: BC Managed Care – PPO | Source: Ambulatory Visit | Attending: Cardiovascular Disease | Admitting: Cardiovascular Disease

## 2022-08-09 ENCOUNTER — Ambulatory Visit: Payer: BC Managed Care – PPO | Attending: Interventional Cardiology

## 2022-08-09 DIAGNOSIS — I4819 Other persistent atrial fibrillation: Secondary | ICD-10-CM

## 2022-08-09 LAB — CBC

## 2022-08-09 LAB — BASIC METABOLIC PANEL
CO2: 25 mmol/L (ref 20–29)
Calcium: 9.6 mg/dL (ref 8.7–10.2)
Chloride: 102 mmol/L (ref 96–106)

## 2022-08-10 LAB — BASIC METABOLIC PANEL
BUN/Creatinine Ratio: 10 (ref 9–20)
BUN: 13 mg/dL (ref 6–24)
Creatinine, Ser: 1.27 mg/dL (ref 0.76–1.27)
Glucose: 89 mg/dL (ref 70–99)
Potassium: 4.5 mmol/L (ref 3.5–5.2)
Sodium: 140 mmol/L (ref 134–144)
eGFR: 67 mL/min/{1.73_m2} (ref >59)

## 2022-08-10 LAB — CBC
Hematocrit: 49.6 % (ref 37.5–51.0)
MCHC: 34.9 g/dL (ref 31.5–35.7)
MCV: 99 fL — ABNORMAL HIGH (ref 79–97)
RBC: 4.99 x10E6/uL (ref 4.14–5.80)

## 2022-08-21 NOTE — Pre-Procedure Instructions (Signed)
Attempted to call patient regarding procedure scheduled 08/31/22.  It is scheduled with anesthesia.  Anesthesia requires Farixga to be held for 72 hours prior to procedure.  Jacob Buchanan- Last dose should be Saturday 08/27/22.  Don't take on Sunday 6/23, Monday 6/24 and Tuesday /25.  Don't take day of your procedure.

## 2022-08-23 ENCOUNTER — Telehealth (HOSPITAL_COMMUNITY): Payer: Self-pay | Admitting: Emergency Medicine

## 2022-08-23 NOTE — Telephone Encounter (Signed)
Attempted to call patient regarding upcoming cardiac CT appointment. °Left message on voicemail with name and callback number °Lokelani Lutes RN Navigator Cardiac Imaging °[facility] °[phone] Office °[phone] Cell ° °

## 2022-08-24 ENCOUNTER — Ambulatory Visit (HOSPITAL_COMMUNITY)
Admission: RE | Admit: 2022-08-24 | Discharge: 2022-08-24 | Disposition: A | Discharge status: Home / Self Care | Payer: BC Managed Care – PPO | Source: Ambulatory Visit | Attending: Cardiovascular Disease | Admitting: Cardiovascular Disease

## 2022-08-24 ENCOUNTER — Encounter (HOSPITAL_COMMUNITY): Payer: Self-pay

## 2022-08-24 DIAGNOSIS — I4819 Other persistent atrial fibrillation: Secondary | ICD-10-CM | POA: Diagnosis not present

## 2022-08-24 MED ORDER — IOHEXOL 350 MG/ML SOLN
100.0000 mL | Freq: Once | INTRAVENOUS | Status: AC | PRN
  Administered 2022-08-24: 100 mL via INTRAVENOUS

## 2022-08-29 DIAGNOSIS — M9901 Segmental and somatic dysfunction of cervical region: Secondary | ICD-10-CM | POA: Diagnosis not present

## 2022-08-29 DIAGNOSIS — M9905 Segmental and somatic dysfunction of pelvic region: Secondary | ICD-10-CM | POA: Diagnosis not present

## 2022-08-29 DIAGNOSIS — M9906 Segmental and somatic dysfunction of lower extremity: Secondary | ICD-10-CM | POA: Diagnosis not present

## 2022-08-29 DIAGNOSIS — M9903 Segmental and somatic dysfunction of lumbar region: Secondary | ICD-10-CM | POA: Diagnosis not present

## 2022-08-30 NOTE — Pre-Procedure Instructions (Signed)
Attempted to call patient regarding procedure instructions for tomorrow.  Left voicemail on on the following items: Arrival time 0930 Nothing to eat or drink after midnight No meds AM of procedure Responsible person to drive you home and stay with you for 24 hrs  Have you missed any doses of anti-coagulant Eliquis- should be taken twice a day, if you have missed any doses please let office.  Don't take dose in the morning.

## 2022-08-31 ENCOUNTER — Ambulatory Visit (HOSPITAL_COMMUNITY)
Admission: RE | Admit: 2022-08-31 | Discharge: 2022-08-31 | Disposition: A | Discharge status: Home / Self Care | Payer: BC Managed Care – PPO | Attending: Cardiovascular Disease | Admitting: Cardiovascular Disease

## 2022-08-31 ENCOUNTER — Encounter (HOSPITAL_COMMUNITY)
Admission: RE | Disposition: A | Discharge status: Home / Self Care | Payer: Self-pay | Source: Home / Self Care | Attending: Cardiovascular Disease

## 2022-08-31 ENCOUNTER — Ambulatory Visit (HOSPITAL_COMMUNITY): Payer: BC Managed Care – PPO | Admitting: Anesthesiology

## 2022-08-31 ENCOUNTER — Other Ambulatory Visit (HOSPITAL_COMMUNITY): Payer: Self-pay

## 2022-08-31 DIAGNOSIS — Z7901 Long term (current) use of anticoagulants: Secondary | ICD-10-CM | POA: Diagnosis not present

## 2022-08-31 DIAGNOSIS — D649 Anemia, unspecified: Secondary | ICD-10-CM | POA: Diagnosis not present

## 2022-08-31 DIAGNOSIS — I10 Essential (primary) hypertension: Secondary | ICD-10-CM | POA: Diagnosis not present

## 2022-08-31 DIAGNOSIS — I5022 Chronic systolic (congestive) heart failure: Secondary | ICD-10-CM | POA: Diagnosis not present

## 2022-08-31 DIAGNOSIS — I4819 Other persistent atrial fibrillation: Secondary | ICD-10-CM | POA: Diagnosis not present

## 2022-08-31 DIAGNOSIS — I4891 Unspecified atrial fibrillation: Secondary | ICD-10-CM | POA: Diagnosis not present

## 2022-08-31 DIAGNOSIS — G4733 Obstructive sleep apnea (adult) (pediatric): Secondary | ICD-10-CM | POA: Insufficient documentation

## 2022-08-31 DIAGNOSIS — E785 Hyperlipidemia, unspecified: Secondary | ICD-10-CM | POA: Diagnosis not present

## 2022-08-31 HISTORY — PX: ATRIAL FIBRILLATION ABLATION: EP1191

## 2022-08-31 SURGERY — ATRIAL FIBRILLATION ABLATION
Anesthesia: General

## 2022-08-31 MED ORDER — ONDANSETRON HCL 4 MG/2ML IJ SOLN
INTRAMUSCULAR | Status: DC | PRN
  Administered 2022-08-31: 4 mg via INTRAVENOUS

## 2022-08-31 MED ORDER — PHENYLEPHRINE HCL-NACL 20-0.9 MG/250ML-% IV SOLN
INTRAVENOUS | Status: DC | PRN
  Administered 2022-08-31: 30 ug/min via INTRAVENOUS

## 2022-08-31 MED ORDER — HEPARIN (PORCINE) IN NACL 1000-0.9 UT/500ML-% IV SOLN
INTRAVENOUS | Status: DC | PRN
  Administered 2022-08-31 (×4): 500 mL

## 2022-08-31 MED ORDER — ACETAMINOPHEN 325 MG PO TABS: 650.0000 mg | ORAL_TABLET | ORAL | Status: DC | PRN

## 2022-08-31 MED ORDER — PROTAMINE SULFATE 10 MG/ML IV SOLN
INTRAVENOUS | Status: DC | PRN
  Administered 2022-08-31: 40 mg via INTRAVENOUS
  Administered 2022-08-31: 10 mg via INTRAVENOUS

## 2022-08-31 MED ORDER — SODIUM CHLORIDE 0.9 % IV SOLN: 250.0000 mL | INTRAVENOUS | Status: DC | PRN

## 2022-08-31 MED ORDER — DOBUTAMINE INFUSION FOR EP/ECHO/NUC (1000 MCG/ML)
INTRAVENOUS | Status: DC | PRN
  Administered 2022-08-31: 20 ug/kg/min via INTRAVENOUS

## 2022-08-31 MED ORDER — SODIUM CHLORIDE 0.9% FLUSH: 3.0000 mL | INTRAVENOUS | Status: DC | PRN

## 2022-08-31 MED ORDER — HEPARIN SODIUM (PORCINE) 1000 UNIT/ML IJ SOLN
INTRAMUSCULAR | Status: DC | PRN
  Administered 2022-08-31: 1000 [IU] via INTRAVENOUS

## 2022-08-31 MED ORDER — ACETAMINOPHEN 500 MG PO TABS
1000.0000 mg | ORAL_TABLET | Freq: Once | ORAL | Status: AC
  Administered 2022-08-31: 1000 mg via ORAL
  Filled 2022-08-31: qty 2

## 2022-08-31 MED ORDER — ONDANSETRON HCL 4 MG/2ML IJ SOLN: 4.0000 mg | Freq: Four times a day (QID) | INTRAMUSCULAR | Status: DC | PRN

## 2022-08-31 MED ORDER — LIDOCAINE 2% (20 MG/ML) 5 ML SYRINGE
INTRAMUSCULAR | Status: DC | PRN
  Administered 2022-08-31: 20 mg via INTRAVENOUS

## 2022-08-31 MED ORDER — MIDAZOLAM HCL 2 MG/2ML IJ SOLN
INTRAMUSCULAR | Status: DC | PRN
  Administered 2022-08-31: 2 mg via INTRAVENOUS

## 2022-08-31 MED ORDER — HEPARIN SODIUM (PORCINE) 1000 UNIT/ML IJ SOLN
INTRAMUSCULAR | Status: DC | PRN
  Administered 2022-08-31: 16000 [IU] via INTRAVENOUS

## 2022-08-31 MED ORDER — DEXAMETHASONE SODIUM PHOSPHATE 10 MG/ML IJ SOLN
INTRAMUSCULAR | Status: DC | PRN
  Administered 2022-08-31: 10 mg via INTRAVENOUS

## 2022-08-31 MED ORDER — PROPOFOL 10 MG/ML IV BOLUS
INTRAVENOUS | Status: DC | PRN
  Administered 2022-08-31: 120 mg via INTRAVENOUS
  Administered 2022-08-31: 80 mg via INTRAVENOUS

## 2022-08-31 MED ORDER — SUGAMMADEX SODIUM 200 MG/2ML IV SOLN
INTRAVENOUS | Status: DC | PRN
  Administered 2022-08-31: 200 mg via INTRAVENOUS

## 2022-08-31 MED ORDER — ROCURONIUM BROMIDE 10 MG/ML (PF) SYRINGE
PREFILLED_SYRINGE | INTRAVENOUS | Status: DC | PRN
  Administered 2022-08-31: 60 mg via INTRAVENOUS
  Administered 2022-08-31: 40 mg via INTRAVENOUS

## 2022-08-31 MED ORDER — DOBUTAMINE INFUSION FOR EP/ECHO/NUC (1000 MCG/ML)
INTRAVENOUS | Status: AC
  Filled 2022-08-31: qty 250

## 2022-08-31 MED ORDER — HEPARIN SODIUM (PORCINE) 1000 UNIT/ML IJ SOLN
INTRAMUSCULAR | Status: AC
  Filled 2022-08-31: qty 10

## 2022-08-31 MED ORDER — FENTANYL CITRATE (PF) 100 MCG/2ML IJ SOLN
INTRAMUSCULAR | Status: DC | PRN
  Administered 2022-08-31 (×2): 50 ug via INTRAVENOUS

## 2022-08-31 MED ORDER — SODIUM CHLORIDE 0.9 % IV SOLN: INTRAVENOUS | Status: DC

## 2022-08-31 MED ORDER — COLCHICINE 0.6 MG PO TABS
0.6000 mg | ORAL_TABLET | Freq: Two times a day (BID) | ORAL | 0 refills | Status: DC
  Filled 2022-08-31: qty 10, 5d supply, fill #0

## 2022-08-31 SURGICAL SUPPLY — 19 items
BAG SNAP BAND KOVER 36X36 (MISCELLANEOUS) IMPLANT
CATH ABLAT QDOT MICRO BI TC FJ (CATHETERS) IMPLANT
CATH OCTARAY 2.0 F 3-3-3-3-3 (CATHETERS) IMPLANT
CATH PIGTAIL STEERABLE D1 8.7 (WIRE) IMPLANT
CATH S-M CIRCA TEMP PROBE (CATHETERS) IMPLANT
CATH SOUNDSTAR ECO 8FR (CATHETERS) IMPLANT
CATH WEBSTER BI DIR CS D-F CRV (CATHETERS) IMPLANT
CLOSURE PERCLOSE PROSTYLE (VASCULAR PRODUCTS) IMPLANT
COVER SWIFTLINK CONNECTOR (BAG) ×1 IMPLANT
DEVICE CLOSURE MYNXGRIP 6/7F (Vascular Products) IMPLANT
PACK EP LATEX FREE (CUSTOM PROCEDURE TRAY) ×1
PACK EP LF (CUSTOM PROCEDURE TRAY) ×1 IMPLANT
PAD DEFIB RADIO PHYSIO CONN (PAD) ×1 IMPLANT
PATCH CARTO3 (PAD) IMPLANT
SHEATH CARTO VIZIGO MED CURVE (SHEATH) IMPLANT
SHEATH PINNACLE 8F 10CM (SHEATH) IMPLANT
SHEATH PINNACLE 9F 10CM (SHEATH) IMPLANT
SHEATH PROBE COVER 6X72 (BAG) IMPLANT
TUBING SMART ABLATE COOLFLOW (TUBING) IMPLANT

## 2022-08-31 NOTE — Anesthesia Postprocedure Evaluation (Signed)
Anesthesia Post Note  Patient: Jacob Buchanan  Procedure(s) Performed: ATRIAL FIBRILLATION ABLATION     Patient location during evaluation: Cath Lab Anesthesia Type: General Level of consciousness: awake and alert, patient cooperative and oriented Pain management: pain level controlled Vital Signs Assessment: post-procedure vital signs reviewed and stable Respiratory status: spontaneous breathing, nonlabored ventilation and respiratory function stable Cardiovascular status: blood pressure returned to baseline and stable Postop Assessment: no apparent nausea or vomiting Anesthetic complications: no   There were no known notable events for this encounter.  Last Vitals:  Vitals:   08/31/22 1500 08/31/22 1502  BP: 116/78   Pulse: 63 (!) 57  Resp: 17 12  Temp: 36.7 C   SpO2: 99% 97%    Last Pain:  Vitals:   08/31/22 1500  TempSrc: Temporal  PainSc: 0-No pain                 Saban Heinlen,E. Ty Buntrock

## 2022-08-31 NOTE — Discharge Instructions (Signed)

## 2022-08-31 NOTE — H&P (Signed)
Electrophysiology Office Note:    Date:  08/31/2022   ID:  [name], DOB 05/06/1970, MRN [id number]  PCP:  Ardith Dark, MD   [facility] Providers Cardiologist:  None Electrophysiologist:  Maurice Small, MD     Referring MD: No ref. provider found   History of Present Illness:    Jacob Buchanan is a 52 y.o. male with a hx listed below, significant for AF, CHFrEF, referred for arrhythmia management.  He was diagnosed with atrial fibrillation at a young age, over 20 years ago.  He had a recurrence in December 2023 when he presented to the ER with respiratory symptoms and tested positive for RSV.  He was placed on a diltiazem drip initially and transitioned to metoprolol.  Echocardiogram showed an EF of 40 to 45%. DCCV on 03/29/22 was unsuccessful.  In follow-up in the A-fib clinic, the patient expressed interest in ablation.  Amiodarone was started as a bridge to the procedure.  I reviewed the patient's CT and labs. There was no LAA thrombus. he  has not missed any doses of anticoagulation, and he took his dose last night. There have been no changes in the patient's diagnoses, medications, or condition since our recent clinic visit.   Past Medical History:  Diagnosis Date   Anemia    Atrial fibrillation [facility])    ED (erectile dysfunction)    Esophageal stricture    GERD (gastroesophageal reflux disease)    Sullivan Lone syndrome    HSV (herpes simplex virus) anogenital infection    Hyperlipidemia    Incomplete right bundle branch block    Partial tear of right rotator cuff     Past Surgical History:  Procedure Laterality Date   CARDIOVERSION N/A 03/29/2022   Procedure: CARDIOVERSION;  Surgeon: Thurmon Fair, MD;  Location: [facility] ENDOSCOPY;  Service: Cardiovascular;  Laterality: N/A;   CARDIOVERSION N/A 06/07/2022   Procedure: CARDIOVERSION;  Surgeon: Chrystie Nose, MD;  Location: [facility] ENDOSCOPY;  Service: Cardiovascular;  Laterality: N/A;   COLONOSCOPY   2004 ; 2007; & 05-29-2012   last one with propofol and hemorroidectomy with sclerosing   COLONOSCOPY  09/05/2019   FINGER SURGERY  1994   repair/ reconstruction right index crush injury   INGUINAL HERNIA REPAIR Bilateral age 20   SHOULDER ARTHROSCOPY WITH LABRAL REPAIR Right 12/31/2013   Procedure: RIGHT SHOULDER ARTHROSCOPY WITH LABRAL REPAIR ;  Surgeon: Eugenia Mcalpine, MD;  Location: [facility] [place] [facility];  Service: Orthopedics;  Laterality: Right;   UPPER GI ENDOSCOPY  2015   esophageal dilation    Current Medications: Current Meds  Medication Sig   amiodarone (PACERONE) 200 MG tablet TAKE 1 TABLET BY MOUTH DAILY   apixaban (ELIQUIS) 5 MG TABS tablet Take 1 tablet (5 mg total) by mouth 2 (two) times daily.   dapagliflozin propanediol (FARXIGA) 10 MG TABS tablet Take 1 tablet (10 mg total) by mouth daily.   ferrous sulfate 325 (65 FE) MG tablet Take 325 mg by mouth in the morning.   fluticasone (FLONASE) 50 MCG/ACT nasal spray Place 2 sprays into both nostrils daily as needed for allergies.   metoprolol succinate (TOPROL-XL) 100 MG 24 hr tablet TAKE 1 TABLET BY MOUTH DAILY. TAKE WITH OR IMMEDIATELY FOLLOWING A MEAL   Multiple Vitamin (MULTIVITAMIN) tablet Take 1 tablet by mouth 3 (three) times a week.   olopatadine (PATADAY) 0.1 % ophthalmic solution Place 1 drop into both eyes 2 (two) times daily as needed for allergies.   omeprazole (  PRILOSEC) 40 MG capsule TAKE 1 CAPSULE BY MOUTH TWICE A DAY (Patient taking differently: Take 40 mg by mouth daily.)   sildenafil (VIAGRA) 100 MG tablet Take 0.5-1 tablets (50-100 mg total) by mouth daily as needed for erectile dysfunction.   tadalafil (CIALIS) 20 MG tablet Take 0.5-1 tablets (10-20 mg total) by mouth daily as needed for erectile dysfunction.   valACYclovir (VALTREX) 1000 MG tablet TAKE 1 TABLET BY MOUTH DAILY (Patient taking differently: Take 1,000 mg by mouth 3 (three) times a week.)     Allergies:   Patient has no known allergies.    Social and Family History: Reviewed in Epic  ROS:   Please see the history of present illness.    All other systems reviewed and are negative.  EKGs/Labs/Other Studies Reviewed Today:    Cardiac Studies & Procedures       ECHOCARDIOGRAM  ECHOCARDIOGRAM COMPLETE 02/28/2022  Narrative ECHOCARDIOGRAM REPORT    Patient Name:   [name] Date of Exam: 02/28/2022 Medical Rec #:  [id number]          Height:       73.0 in Accession #:    [id number]         Weight:       200.8 lb Date of Birth:  Jan 15, 1971          BSA:          2.155 m Patient Age:    51 years           BP:           115/77 mmHg Patient Gender: M                  HR:           94 bpm. Exam Location:  Inpatient  Procedure: 2D Echo  Indications:    atrial fibrillation  History:        Patient has no prior history of Echocardiogram examinations. Arrythmias:Atrial Fibrillation; Risk Factors:Dyslipidemia.  Sonographer:    Delcie Roch RDCS Referring Phys: [id number] JENNIFER YATES  IMPRESSIONS   1. Left ventricular ejection fraction, by estimation, is 40 to 45%. The left ventricle has mildly decreased function. The left ventricle demonstrates global hypokinesis. Left ventricular diastolic function could not be evaluated. 2. Right ventricular systolic function is mildly reduced. The right ventricular size is normal. There is normal pulmonary artery systolic pressure. 3. Left atrial size was mildly dilated. 4. Right atrial size was mildly dilated. 5. The mitral valve is normal in structure. No evidence of mitral valve regurgitation. No evidence of mitral stenosis. 6. The aortic valve is normal in structure. Aortic valve regurgitation is not visualized. No aortic stenosis is present. 7. The inferior vena cava is dilated in size with >50% respiratory variability, suggesting right atrial pressure of 8 mmHg.  FINDINGS Left Ventricle: Left ventricular ejection fraction, by estimation, is 40 to 45%. The left  ventricle has mildly decreased function. The left ventricle demonstrates global hypokinesis. The left ventricular internal cavity size was normal in size. There is no left ventricular hypertrophy. Left ventricular diastolic function could not be evaluated due to atrial fibrillation. Left ventricular diastolic function could not be evaluated.  Right Ventricle: The right ventricular size is normal. No increase in right ventricular wall thickness. Right ventricular systolic function is mildly reduced. There is normal pulmonary artery systolic pressure. The tricuspid regurgitant velocity is 1.86 m/s, and with an assumed right atrial pressure of 8 mmHg, the estimated right  ventricular systolic pressure is 21.8 mmHg.  Left Atrium: Left atrial size was mildly dilated.  Right Atrium: Right atrial size was mildly dilated.  Pericardium: There is no evidence of pericardial effusion.  Mitral Valve: The mitral valve is normal in structure. No evidence of mitral valve regurgitation. No evidence of mitral valve stenosis.  Tricuspid Valve: The tricuspid valve is normal in structure. Tricuspid valve regurgitation is not demonstrated. No evidence of tricuspid stenosis.  Aortic Valve: The aortic valve is normal in structure. Aortic valve regurgitation is not visualized. No aortic stenosis is present.  Pulmonic Valve: The pulmonic valve was normal in structure. Pulmonic valve regurgitation is not visualized. No evidence of pulmonic stenosis.  Aorta: The aortic root is normal in size and structure.  Venous: The inferior vena cava is dilated in size with greater than 50% respiratory variability, suggesting right atrial pressure of 8 mmHg.  IAS/Shunts: No atrial level shunt detected by color flow Doppler.   LEFT VENTRICLE PLAX 2D LVOT diam:     2.30 cm LVOT Area:     4.15 cm   RIGHT VENTRICLE             IVC RV S prime:     10.90 cm/s  IVC diam: 2.30 cm TAPSE (M-mode): 1.6 cm  LEFT ATRIUM              Index        RIGHT ATRIUM           Index LA Vol (A2C):   80.6 ml 37.39 ml/m  RA Area:     22.90 cm LA Vol (A4C):   49.8 ml 23.10 ml/m  RA Volume:   70.70 ml  32.80 ml/m LA Biplane Vol: 65.7 ml 30.48 ml/m  AORTA Ao Root diam: 3.50 cm Ao Asc diam:  3.10 cm  TRICUSPID VALVE TR Peak grad:   13.8 mmHg TR Vmax:        186.00 cm/s  SHUNTS Systemic Diam: 2.30 cm  Chilton Si MD Electronically signed by Chilton Si MD Signature Date/Time: 02/28/2022/3:58:34 PM    Final             EKG:  Last EKG results: today - AF with V-rate 89 bpm   Recent Labs: 10/26/2021: ALT 41 02/27/2022: B Natriuretic Peptide 273.5; Magnesium 2.1 03/01/2022: TSH 0.543 08/09/2022: BUN 13; Creatinine, Ser 1.27; Hemoglobin 17.3; Platelets 198; Potassium 4.5; Sodium 140     Physical Exam:    VS:  BP (!) 143/88   Pulse (!) 58   Temp 98.2 F (36.8 C) (Oral)   Resp 18   Ht 6\' 1"  (1.854 m)   Wt 88.9 kg   SpO2 95%   BMI 25.86 kg/m     Wt Readings from Last 3 Encounters:  08/31/22 88.9 kg  06/07/22 90.7 kg  05/19/22 93 kg     GEN: Well nourished, well developed in no acute distress CARDIAC: iRRR, no murmurs, rubs, gallops RESPIRATORY:  Normal work of breathing MUSCULOSKELETAL: no edema    ASSESSMENT & PLAN:    Persistent atrial fibrillation: symptomatic with some mild fatigue. Patient wishes to pursue rhythm control. We discussed options. He prefers an early invasive strategy. And presents today for ablation  CHF with mid-range EF: EF 40-45%, likely tachycardia-mediated OSA: has not tolerated CPAP.     We discussed the indication, rationale, logistics, anticipated benefits, and potential risks of the ablation procedure including but not limited to -- bleed at the groin access site, chest pain,  damage to nearby organs such as the diaphragm, lungs, or esophagus, need for a drainage tube, or prolonged hospitalization. I explained that the risk for stroke, heart attack, need for  open chest surgery, or even death is very low but not zero. he  expressed understanding and wishes to proceed.     Medication Adjustments/Labs and Tests Ordered: Current medicines are reviewed at length with the patient today.  Concerns regarding medicines are outlined above.  Orders Placed This Encounter  Procedures   Informed Consent Details: Physician/Practitioner Attestation; Transcribe to consent form and obtain patient signature   Initiate Pre-op Protocol   Void on call to EP Lab   Confirm CBC and BMP (or CMP) results within 7 days for inpatient and 30 days for outpatient:   Clip right and left femoral area PM before surgery   Clip right internal jugular area PM before surgery   Pre-admission testing diagnosis   EP STUDY   Insert peripheral IV   Meds ordered this encounter  Medications   0.9 %  sodium chloride infusion   acetaminophen (TYLENOL) tablet 1,000 mg     Signed, Maurice Small, MD  08/31/2022 11:19 AM    [facility]

## 2022-08-31 NOTE — Anesthesia Preprocedure Evaluation (Addendum)
Anesthesia Evaluation  Patient identified by MRN, date of birth, ID band Patient awake    Reviewed: Allergy & Precautions, NPO status , Patient's Chart, lab work & pertinent test results, reviewed documented beta blocker date and time   History of Anesthesia Complications Negative for: history of anesthetic complications  Airway Mallampati: II  TM Distance: >3 FB Neck ROM: Full    Dental  (+) Dental Advisory Given   Pulmonary neg pulmonary ROS   breath sounds clear to auscultation       Cardiovascular hypertension, Pt. on medications and Pt. on home beta blockers (-) angina + dysrhythmias Atrial Fibrillation  Rhythm:Irregular Rate:Normal  '23 ECHO:  EF 40-45%. The LV has mildly decreased function, global hypokinesis. Left ventricular diastolic function could not be evaluated.   2. RVF is mildly reduced. The right ventricular size is normal. There is normal pulmonary artery systolic pressure.   3. Left atrial size was mildly dilated.   4. Right atrial size was mildly dilated.   5. The MV is normal in structure. No evidence of MR. No evidence of MS.   6. The aortic valve is normal in structure. Aortic valve regurgitation is not visualized. No AS is present.     Neuro/Psych  Headaches  negative psych ROS   GI/Hepatic Neg liver ROS,GERD  Medicated and Controlled,,  Endo/Other  negative endocrine ROS    Renal/GU negative Renal ROS     Musculoskeletal   Abdominal   Peds  Hematology eliquis   Anesthesia Other Findings   Reproductive/Obstetrics                             Anesthesia Physical Anesthesia Plan  ASA: 3  Anesthesia Plan: General   Post-op Pain Management: Tylenol PO (pre-op)*   Induction: Intravenous  PONV Risk Score and Plan: 2 and Ondansetron and Dexamethasone  Airway Management Planned: Oral ETT  Additional Equipment: None  Intra-op Plan:   Post-operative Plan:  Extubation in OR  Informed Consent: I have reviewed the patients History and Physical, chart, labs and discussed the procedure including the risks, benefits and alternatives for the proposed anesthesia with the patient or authorized representative who has indicated his/her understanding and acceptance.     Dental advisory given  Plan Discussed with: CRNA and Surgeon  Anesthesia Plan Comments:         Anesthesia Quick Evaluation

## 2022-08-31 NOTE — Anesthesia Procedure Notes (Signed)
Procedure Name: Intubation Date/Time: 08/31/2022 12:00 PM  Performed by: Audie Pinto, CRNAPre-anesthesia Checklist: Patient identified, Emergency Drugs available, Suction available and Patient being monitored Patient Re-evaluated:Patient Re-evaluated prior to induction Oxygen Delivery Method: Circle system utilized Preoxygenation: Pre-oxygenation with 100% oxygen Induction Type: IV induction Ventilation: Mask ventilation without difficulty and Oral airway inserted - appropriate to patient size Laryngoscope Size: Mac and 4 Grade View: Grade III Tube type: Oral Tube size: 7.5 mm Number of attempts: 1 Airway Equipment and Method: Stylet and Oral airway Placement Confirmation: ETT inserted through vocal cords under direct vision, positive ETCO2 and breath sounds checked- equal and bilateral Secured at: 23 cm Tube secured with: Tape Dental Injury: Teeth and Oropharynx as per pre-operative assessment

## 2022-08-31 NOTE — Transfer of Care (Signed)
Immediate Anesthesia Transfer of Care Note  Patient: Jacob Buchanan  Procedure(s) Performed: ATRIAL FIBRILLATION ABLATION  Patient Location: PACU and Cath Lab  Anesthesia Type:General  Level of Consciousness: awake, alert , and oriented  Airway & Oxygen Therapy: Patient Spontanous Breathing and Patient connected to nasal cannula oxygen  Post-op Assessment: Report given to RN and Post -op Vital signs reviewed and stable  Post vital signs: Reviewed and stable  Last Vitals:  Vitals Value Taken Time  BP 128/86 08/31/22 1401  Temp    Pulse 69 08/31/22 1403  Resp 17 08/31/22 1403  SpO2 98 % 08/31/22 1403  Vitals shown include unvalidated device data.  Last Pain:  Vitals:   08/31/22 0944  TempSrc: Oral         Complications: There were no known notable events for this encounter.

## 2022-09-01 ENCOUNTER — Encounter (HOSPITAL_COMMUNITY): Payer: Self-pay | Admitting: Cardiovascular Disease

## 2022-09-01 LAB — POCT ACTIVATED CLOTTING TIME
Activated Clotting Time: 324 seconds
Activated Clotting Time: 342 seconds

## 2022-09-05 ENCOUNTER — Other Ambulatory Visit: Payer: Self-pay | Admitting: Otolaryngology

## 2022-09-14 DIAGNOSIS — M9901 Segmental and somatic dysfunction of cervical region: Secondary | ICD-10-CM | POA: Diagnosis not present

## 2022-09-14 DIAGNOSIS — M9905 Segmental and somatic dysfunction of pelvic region: Secondary | ICD-10-CM | POA: Diagnosis not present

## 2022-09-14 DIAGNOSIS — M9906 Segmental and somatic dysfunction of lower extremity: Secondary | ICD-10-CM | POA: Diagnosis not present

## 2022-09-14 DIAGNOSIS — M9903 Segmental and somatic dysfunction of lumbar region: Secondary | ICD-10-CM | POA: Diagnosis not present

## 2022-09-29 ENCOUNTER — Ambulatory Visit (HOSPITAL_COMMUNITY)
Admission: RE | Admit: 2022-09-29 | Discharge: 2022-09-29 | Disposition: A | Discharge status: Home / Self Care | Payer: BC Managed Care – PPO | Source: Ambulatory Visit | Attending: Physician Assistant | Admitting: Physician Assistant

## 2022-09-29 ENCOUNTER — Encounter (HOSPITAL_COMMUNITY): Payer: Self-pay | Admitting: Physician Assistant

## 2022-09-29 VITALS — BP 128/74 | HR 58 | Ht 73.0 in | Wt 200.6 lb

## 2022-09-29 DIAGNOSIS — Z79899 Other long term (current) drug therapy: Secondary | ICD-10-CM | POA: Diagnosis not present

## 2022-09-29 DIAGNOSIS — I5020 Unspecified systolic (congestive) heart failure: Secondary | ICD-10-CM | POA: Diagnosis not present

## 2022-09-29 DIAGNOSIS — I4819 Other persistent atrial fibrillation: Secondary | ICD-10-CM | POA: Diagnosis not present

## 2022-09-29 DIAGNOSIS — I4510 Unspecified right bundle-branch block: Secondary | ICD-10-CM | POA: Diagnosis not present

## 2022-09-29 DIAGNOSIS — E785 Hyperlipidemia, unspecified: Secondary | ICD-10-CM | POA: Diagnosis not present

## 2022-09-29 DIAGNOSIS — Z5181 Encounter for therapeutic drug level monitoring: Secondary | ICD-10-CM | POA: Diagnosis not present

## 2022-09-29 DIAGNOSIS — Z7901 Long term (current) use of anticoagulants: Secondary | ICD-10-CM | POA: Diagnosis not present

## 2022-09-29 DIAGNOSIS — G4733 Obstructive sleep apnea (adult) (pediatric): Secondary | ICD-10-CM | POA: Diagnosis not present

## 2022-09-29 NOTE — Progress Notes (Signed)
Primary Care Physician: Ardith Dark, MD Primary Cardiologist: Dr Jacques Navy  Primary Electrophysiologist: Dr Nelly Laurence  Referring Physician: Dr Keturah Barre is a 52 y.o. male with a history of Gilbert's disease HLD, systolic CHF, atrial fibrillation who presents for follow up in the [facility].  The patient was initially diagnosed with atrial fibrillation remotely 20+ years ago. He had done well without recurrence until he presented to the ED 02/27/22 with congestion and SOB. He tested positive for RSV and was incidentally found to be in afib with RVR. He was started on a diltiazem drip and transferred to [facility] and admitted. Echo showed EF 40-45%, suspected related to tachycardia. Diltiazem was discontinued and metoprolol started for rate control. He was also started on Eliquis for a CHADS2VASC score of 1.   Patient is s/p DCCV 03/29/22 which was unsuccessful. He was loaded on amiodarone as a bridge to ablation and underwent repeat DCCV on 06/07/22. He then had an afib ablation with Dr Nelly Laurence on 08/31/22.  On follow up today, patient reports that he has done well since the ablation with no interim episodes of afib detected. He denies chest pain, swallowing pain, or groin issues. No bleeding issues on anticoagulation.   Today, he denies symptoms of palpitations, chest pain, shortness of breath, orthopnea, PND, lower extremity edema, presyncope, syncope, snoring, daytime somnolence, bleeding, or neurologic sequela. The patient is tolerating medications without difficulties and is otherwise without complaint today.    Atrial Fibrillation Risk Factors:  he does not have symptoms or diagnosis of sleep apnea. he does not have a history of rheumatic fever. he does have a history of alcohol use. The patient does have a history of early familial atrial fibrillation or other arrhythmias. Father had afib.   Atrial Fibrillation Management  history:  Previous antiarrhythmic drugs: amiodarone  Previous cardioversions: 03/29/22, 06/07/22 Previous ablations: 08/31/22 Anticoagulation history: Eliquis   Past Medical History:  Diagnosis Date   Anemia    Atrial fibrillation [facility])    ED (erectile dysfunction)    Esophageal stricture    GERD (gastroesophageal reflux disease)    Sullivan Lone syndrome    HSV (herpes simplex virus) anogenital infection    Hyperlipidemia    Incomplete right bundle branch block    Partial tear of right rotator cuff     ROS- All systems are reviewed and negative except as per the HPI above.  Physical Exam: Vitals:   09/29/22 1108  BP: 128/74  Pulse: (!) 58  Weight: 91 kg  Height: 6\' 1"  (1.854 m)    GEN: Well nourished, well developed in no acute distress NECK: No JVD; No carotid bruits CARDIAC: Regular rate and rhythm, no murmurs, rubs, gallops RESPIRATORY:  Clear to auscultation without rales, wheezing or rhonchi  ABDOMEN: Soft, non-tender, non-distended EXTREMITIES:  No edema; No deformity    Wt Readings from Last 3 Encounters:  09/29/22 91 kg  08/31/22 88.9 kg  06/07/22 90.7 kg    EKG today demonstrates  SB, NST Vent. rate 58 BPM PR interval 154 ms QRS duration 104 ms QT/QTcB 432/424 ms  Echo 02/28/22 demonstrated   1. Left ventricular ejection fraction, by estimation, is 40 to 45%. The  left ventricle has mildly decreased function. The left ventricle  demonstrates global hypokinesis. Left ventricular diastolic function could  not be evaluated.   2. Right ventricular systolic function is mildly reduced. The right  ventricular size is normal. There is normal pulmonary  artery systolic  pressure.   3. Left atrial size was mildly dilated.   4. Right atrial size was mildly dilated.   5. The mitral valve is normal in structure. No evidence of mitral valve  regurgitation. No evidence of mitral stenosis.   6. The aortic valve is normal in structure. Aortic valve regurgitation is   not visualized. No aortic stenosis is present.   7. The inferior vena cava is dilated in size with >50% respiratory  variability, suggesting right atrial pressure of 8 mmHg.   Epic records are reviewed at length today  CHA2DS2-VASc Score = 1  The patient's score is based upon: CHF History: 1 HTN History: 0 Diabetes History: 0 Stroke History: 0 Vascular Disease History: 0 Age Score: 0 Gender Score: 0       ASSESSMENT AND PLAN: Persistent Atrial Fibrillation (ICD10:  I48.19) The patient's CHA2DS2-VASc score is 1, indicating a 0.6% annual risk of stroke.   S/p afib ablation 08/31/22 Patient appears to be maintaining SR Continue amiodarone 200 mg daily for now, hopefully this will be short term post ablation.  Continue Eliquis 5 mg BID with no missed doses for 3 months post ablation.  Continue Toprol 100 mg daily  HFmrEF EF 40-45% Suspected tachycardia mediated Fluid status appears stable today  OSA  Has not tolerated CPAP in the past He is working with PCP to get referral for Inspire.   Follow up with Dr Nelly Laurence as scheduled.    Jorja Loa PA-C Afib Clinic [facility] [address] [place], [place] [address] [phone] 09/29/2022 1:23 PM

## 2022-10-27 ENCOUNTER — Encounter (HOSPITAL_COMMUNITY): Payer: Self-pay | Admitting: Otolaryngology

## 2022-10-28 ENCOUNTER — Other Ambulatory Visit: Payer: Self-pay

## 2022-10-28 ENCOUNTER — Encounter (HOSPITAL_COMMUNITY): Payer: Self-pay | Admitting: Otolaryngology

## 2022-10-28 MED ORDER — APIXABAN 5 MG PO TABS: 5.0000 mg | ORAL_TABLET | Freq: Two times a day (BID) | ORAL | 5 refills | Status: DC

## 2022-10-28 NOTE — Progress Notes (Signed)
Mr. Jacob Buchanan denies chest pain or shortness of breath.  Patient denies having any s/s of Covid in his household, also denies any known exposure to Covid. Mr. Jacob Buchanan denies  any s/s of upper or lower respiratory infection in the past 8 weeks.   Mr. Barb' PCP is Dr. Jacquiline Doe. Electrophysiologist is Dr, York Pellant.  Mr.  Jacob Buchanan takes Marcelline Deist for CHF- I instructed patient to not take it again until after surgery.  Mr. Jacob Buchanan reports that he is continuing to take Eliquis.

## 2022-10-28 NOTE — Telephone Encounter (Signed)
Prescription refill request for Eliquis received. Indication:afib Last office visit:7/24 Scr:1.27  6/24 Age: 52 Weight:91  kg  Prescription refilled

## 2022-10-28 NOTE — Anesthesia Preprocedure Evaluation (Addendum)
Anesthesia Evaluation  Patient identified by MRN, date of birth, ID band Patient awake    Reviewed: Allergy & Precautions, NPO status , Patient's Chart, lab work & pertinent test results  History of Anesthesia Complications Negative for: history of anesthetic complications  Airway Mallampati: II  TM Distance: >3 FB Neck ROM: Full    Dental  (+) Dental Advisory Given   Pulmonary asthma , sleep apnea    breath sounds clear to auscultation       Cardiovascular hypertension, Pt. on medications and Pt. on home beta blockers (-) angina +CHF  + dysrhythmias Atrial Fibrillation  Rhythm:Irregular Rate:Normal  Echo 02/2022   1. Left ventricular ejection fraction, by estimation, is 40 to 45%. The  left ventricle has mildly decreased function. The left ventricle demonstrates global hypokinesis. Left ventricular diastolic function could not be evaluated.   2. Right ventricular systolic function is mildly reduced. The right ventricular size is normal. There is normal pulmonary artery systolic pressure.   3. Left atrial size was mildly dilated.   4. Right atrial size was mildly dilated.   5. The mitral valve is normal in structure. No evidence of mitral valve regurgitation. No evidence of mitral stenosis.   6. The aortic valve is normal in structure. Aortic valve regurgitation is not visualized. No aortic stenosis is present.   7. The inferior vena cava is dilated in size with >50% respiratory variability, suggesting right atrial pressure of 8 mmHg.      Neuro/Psych  Headaches  negative psych ROS   GI/Hepatic Neg liver ROS,GERD  Medicated and Controlled,,  Endo/Other   Hyperthyroidism   Renal/GU negative Renal ROS     Musculoskeletal   Abdominal   Peds  Hematology  (+) Blood dyscrasia, anemia eliquis   Anesthesia Other Findings   Reproductive/Obstetrics                             Anesthesia  Physical Anesthesia Plan  ASA: 3  Anesthesia Plan: General   Post-op Pain Management: Minimal or no pain anticipated   Induction: Intravenous  PONV Risk Score and Plan: 2 and Ondansetron, Propofol infusion, TIVA and Treatment may vary due to age or medical condition  Airway Management Planned: Natural Airway  Additional Equipment: None  Intra-op Plan:   Post-operative Plan:   Informed Consent: I have reviewed the patients History and Physical, chart, labs and discussed the procedure including the risks, benefits and alternatives for the proposed anesthesia with the patient or authorized representative who has indicated his/her understanding and acceptance.     Dental advisory given  Plan Discussed with: CRNA  Anesthesia Plan Comments: (See PAT note written 10/28/2022 by Shonna Chock, PA-C.  )        Anesthesia Quick Evaluation

## 2022-10-28 NOTE — Progress Notes (Signed)
Anesthesia Chart Review: SAME DAY WORK-UP  Case: [id number] Date/Time: 10/31/22 1315   Procedure: DRUG INDUCED SLEEP  ENDOSCOPY (Bilateral)   Anesthesia type: General   Pre-op diagnosis:      Obstructive sleep apnea     Snoring   [facility]: [facility] ROOM 12 / [facility]   Surgeons: Christia Reading, MD       DISCUSSION: Patient is a 52 year old male scheduled for the above procedure.  History includes never smoker, Afib (? Remote history with recurrence 02/27/22 afib with RVR in setting of RSV; s/p unsuccessful DCCV 03/29/22; DCCV 06/07/22, s/p ablation 08/31/22), HFmEF (thought to be tachy-mediated induced 02/2022; Coronary calcium score 0 03/2021 & 08/2022), incomplete RBBB, HLD, Gilbert Syndrome, GERD, anemia, right shoulder recurrent dislocation (s/p right shoulder arthroscopy with capsulolabral reconstruction, labral repair 12/31/13).  S/p Afib ablation 08/31/22. He had [facility] Clinic follow-up with Alphonzo Severance, PA-C on 09/29/22. No known recurrent afib. No chest pain or bleeding issues. No palpitations, SOB, edema, or syncope. CHA2DS2-VASc score is 1. Continue Toprol 100 mg daily and amiodarone 200 mg daily for now. He will need to continue Eliquis without stopping for a total of 3 months post ablation. Volume status stable. Was working on getting Inspire device due to severe OSA with CPAP intolerance. He is scheduled to see Dr. Nelly Laurence on 12/01/22.   For a sleep endoscopy, I would not anticipate that he would have to hold anticoagulation--based on EP notes, he should NOT hold Eliquis until 3 months post ablation (which would be after 12/01/22). He is a same day work-up. PAT RN staff have been attempting to reach Jacob Buchanan to confirm what he was instructed. I was unsuccessful at reaching Dr. Jenne Pane' scheduler.  I've told our RN staff that if any conflicting instructions then would advise him to contact after hours providers to clarify.    VS:  Temp Readings from Last 3 Encounters:  08/31/22 36.7 C (Temporal)   06/07/22 36.5 C (Temporal)  03/29/22 (!) 36.4 C (Tympanic)   BP Readings from Last 3 Encounters:  09/29/22 128/74  08/31/22 (!) 136/92  08/24/22 (!) 146/90   Pulse Readings from Last 3 Encounters:  09/29/22 (!) 58  08/31/22 69  06/07/22 62     PROVIDERS: Ardith Dark, MD is PCP  Weston Brass, MD is listed as primary cardiologist (only saw during 02/2022 admission for afib with RVR, primarily followed at the [facility] Clinic by Alphonzo Severance, PA-C) Mealor, Jari Pigg, MD is EP Claudette Head, MD is GI   LABS: Most recent lab results in [facility] include: Lab Results  Component Value Date   WBC 6.0 08/09/2022   HGB 17.3 08/09/2022   HCT 49.6 08/09/2022   PLT 198 08/09/2022   GLUCOSE 89 08/09/2022   CHOL 199 10/26/2021   TRIG 309.0 (H) 10/26/2021   HDL 37.80 (L) 10/26/2021   LDLDIRECT 89.0 10/26/2021   LDLCALC 122 (H) 06/18/2020   ALT 41 10/26/2021   AST 36 10/26/2021   NA 140 08/09/2022   K 4.5 08/09/2022   CL 102 08/09/2022   CREATININE 1.27 08/09/2022   BUN 13 08/09/2022   CO2 25 08/09/2022   TSH 0.543 03/01/2022   PSA 1.11 03/09/2021   HGBA1C 5.1 10/26/2021    Home Sleep Study 06/21/22 ([facility]): IMPRESSIONS  - Patient snored 48.3% during the sleep.  - Severe Oxygen Desaturation  - Severe Obstructive Sleep apnea OSA  NREM AHI = 70.8. REM AHI = 50.2.   DIAGNOSIS  - Severe Obstructive Sleep  Apnea  327.23 [G47.33 ICD-10]  - Central Sleep Apnea  327.27 [G47.37 ICD-10]  - Nocturnal Hypoxemia  327.26 [G47.36 ICD-10]   RECOMMENDATIONS  - CPAP titration to determine optimal pressure required to alleviate sleep disordered breathing. BiPAP or ASV titration may be required to eliminate central sleep apnea...     IMAGES: CT Chest (over read CT Cardiac) 08/24/22: IMPRESSION: No significant extracardiac findings within the visualized chest.    EKG: EKG 09/29/22: Sinus bradycardia Left axis deviation Incomplete right bundle branch block T wave abnormality,  consider anterolateral ischemia Abnormal ECG When compared with ECG of 31-Aug-2022 14:19, PREVIOUS ECG IS PRESENT Since last tracing rate slower Confirmed by Rinaldo Cloud ([id number]) on 09/29/2022 11:43:33 PM   CV: Echo 02/28/22: IMPRESSIONS   1. Left ventricular ejection fraction, by estimation, is 40 to 45%. The  left ventricle has mildly decreased function. The left ventricle  demonstrates global hypokinesis. Left ventricular diastolic function could  not be evaluated.   2. Right ventricular systolic function is mildly reduced. The right  ventricular size is normal. There is normal pulmonary artery systolic  pressure.   3. Left atrial size was mildly dilated.   4. Right atrial size was mildly dilated.   5. The mitral valve is normal in structure. No evidence of mitral valve  regurgitation. No evidence of mitral stenosis.   6. The aortic valve is normal in structure. Aortic valve regurgitation is  not visualized. No aortic stenosis is present.   7. The inferior vena cava is dilated in size with >50% respiratory  variability, suggesting right atrial pressure of 8 mmHg.    CT/CTA Cardiac 08/24/22 (Pre-PVI): IMPRESSION: 1. There is normal pulmonary vein drainage into the left atrium. 2. The moderate-sized atrial appendage is a chicken-wing type with a single lobe and ostial size 12 x 12 mm and length 30 mm. There is no thrombus in the left atrial appendage, verified on PV delay images. 3. The esophagus runs near the ostium of the left inferior pulmonary vein. 4. Calcium score: Coronary calcium score of 0.  CT Cardiac Scoring 04/05/21: CORONARY CALCIUM SCORES: Left Main: 0 LAD: 0 LCx: 0 RCA: 0 Total Agatston Score: 0 MESA database percentile: 0  AORTA MEASUREMENTS: Ascending Aorta: 32 mm Descending Aorta: 25 mm  IMPRESSION: Coronary calcium score of 0.   Past Medical History:  Diagnosis Date   Anemia    Atrial fibrillation (HCC)    CHF (congestive heart failure)  (HCC)    ED (erectile dysfunction)    Esophageal stricture    GERD (gastroesophageal reflux disease)    Sullivan Lone syndrome    HSV (herpes simplex virus) anogenital infection    Hyperlipidemia    Incomplete right bundle branch block    Partial tear of right rotator cuff     Past Surgical History:  Procedure Laterality Date   ATRIAL FIBRILLATION ABLATION N/A 08/31/2022   Procedure: ATRIAL FIBRILLATION ABLATION;  Surgeon: Maurice Small, MD;  [facility] INV[facility] CV [facility];  Service: Cardiovascular;  Laterality: N/A;   CARDIOVERSION N/A 03/29/2022   Procedure: CARDIOVERSION;  Surgeon: Thurmon Fair, MD;  Location: [facility] ENDOSCOPY;  Service: Cardiovascular;  Laterality: N/A;   CARDIOVERSION N/A 06/07/2022   Procedure: CARDIOVERSION;  Surgeon: Chrystie Nose, MD;  Location: [facility] ENDOSCOPY;  Service: Cardiovascular;  Laterality: N/A;   COLONOSCOPY  2004 ; 2007; & 05-29-2012   last one with propofol and hemorroidectomy with sclerosing   COLONOSCOPY  09/05/2019   FINGER SURGERY  1994   repair/ reconstruction  right index crush injury   INGUINAL HERNIA REPAIR Bilateral age 8   SHOULDER ARTHROSCOPY WITH LABRAL REPAIR Right 12/31/2013   Procedure: RIGHT SHOULDER ARTHROSCOPY WITH LABRAL REPAIR ;  Surgeon: Eugenia Mcalpine, MD;  Location: [facility];  Service: Orthopedics;  Laterality: Right;   UPPER GI ENDOSCOPY  2015   esophageal dilation    MEDICATIONS: No current facility-administered medications for this encounter.    amiodarone (PACERONE) 200 MG tablet   dapagliflozin propanediol (FARXIGA) 10 MG TABS tablet   ferrous sulfate 325 (65 FE) MG tablet   fluticasone (FLONASE) 50 MCG/ACT nasal spray   metoprolol succinate (TOPROL-XL) 100 MG 24 hr tablet   Multiple Vitamin (MULTIVITAMIN) tablet   olopatadine (PATADAY) 0.1 % ophthalmic solution   omeprazole (PRILOSEC) 40 MG capsule   sildenafil (VIAGRA) 100 MG tablet   tadalafil (CIALIS) 20 MG tablet   valACYclovir (VALTREX) 1000  MG tablet   apixaban (ELIQUIS) 5 MG TABS tablet   colchicine 0.6 MG tablet    Shonna Chock, PA-C Surgical Short Stay/Anesthesiology [facility] Phone [phone] [facility] Phone [phone] 10/28/2022 4:59 PM

## 2022-10-31 ENCOUNTER — Ambulatory Visit (HOSPITAL_COMMUNITY): Payer: Self-pay | Admitting: Vascular Surgery

## 2022-10-31 ENCOUNTER — Other Ambulatory Visit: Payer: Self-pay

## 2022-10-31 ENCOUNTER — Encounter (HOSPITAL_COMMUNITY): Payer: Self-pay | Admitting: Otolaryngology

## 2022-10-31 ENCOUNTER — Ambulatory Visit (HOSPITAL_COMMUNITY): Payer: BC Managed Care – PPO | Admitting: Vascular Surgery

## 2022-10-31 ENCOUNTER — Encounter (HOSPITAL_COMMUNITY)
Admission: RE | Disposition: A | Discharge status: Home / Self Care | Payer: Self-pay | Source: Home / Self Care | Attending: Otolaryngology

## 2022-10-31 ENCOUNTER — Ambulatory Visit (HOSPITAL_COMMUNITY)
Admission: RE | Admit: 2022-10-31 | Discharge: 2022-10-31 | Disposition: A | Discharge status: Home / Self Care | Payer: BC Managed Care – PPO | Attending: Otolaryngology | Admitting: Otolaryngology

## 2022-10-31 DIAGNOSIS — G4733 Obstructive sleep apnea (adult) (pediatric): Secondary | ICD-10-CM | POA: Diagnosis not present

## 2022-10-31 DIAGNOSIS — Z7901 Long term (current) use of anticoagulants: Secondary | ICD-10-CM | POA: Diagnosis not present

## 2022-10-31 DIAGNOSIS — K219 Gastro-esophageal reflux disease without esophagitis: Secondary | ICD-10-CM | POA: Diagnosis not present

## 2022-10-31 DIAGNOSIS — I509 Heart failure, unspecified: Secondary | ICD-10-CM | POA: Insufficient documentation

## 2022-10-31 DIAGNOSIS — J45909 Unspecified asthma, uncomplicated: Secondary | ICD-10-CM | POA: Diagnosis not present

## 2022-10-31 DIAGNOSIS — I4891 Unspecified atrial fibrillation: Secondary | ICD-10-CM | POA: Insufficient documentation

## 2022-10-31 DIAGNOSIS — I110 Hypertensive heart disease with heart failure: Secondary | ICD-10-CM | POA: Diagnosis not present

## 2022-10-31 DIAGNOSIS — I4510 Unspecified right bundle-branch block: Secondary | ICD-10-CM | POA: Insufficient documentation

## 2022-10-31 DIAGNOSIS — R0683 Snoring: Secondary | ICD-10-CM | POA: Diagnosis not present

## 2022-10-31 DIAGNOSIS — F1290 Cannabis use, unspecified, uncomplicated: Secondary | ICD-10-CM | POA: Insufficient documentation

## 2022-10-31 HISTORY — DX: Sleep apnea, unspecified: G47.30

## 2022-10-31 HISTORY — PX: DRUG INDUCED ENDOSCOPY: SHX6808

## 2022-10-31 HISTORY — DX: Heart failure, unspecified: I50.9

## 2022-10-31 LAB — BASIC METABOLIC PANEL
Anion gap: 11 (ref 5–15)
BUN: 13 mg/dL (ref 6–20)
CO2: 25 mmol/L (ref 22–32)
Calcium: 9 mg/dL (ref 8.9–10.3)
Chloride: 104 mmol/L (ref 98–111)
Creatinine, Ser: 1.14 mg/dL (ref 0.61–1.24)
GFR, Estimated: >60 mL/min (ref >60)
Glucose, Bld: 90 mg/dL (ref 70–99)
Potassium: 3.9 mmol/L (ref 3.5–5.1)
Sodium: 140 mmol/L (ref 135–145)

## 2022-10-31 LAB — CBC
HCT: 50.1 % (ref 39.0–52.0)
Hemoglobin: 17.7 g/dL — ABNORMAL HIGH (ref 13.0–17.0)
MCH: 34.2 pg — ABNORMAL HIGH (ref 26.0–34.0)
MCHC: 35.3 g/dL (ref 30.0–36.0)
MCV: 96.7 fL (ref 80.0–100.0)
Platelets: 164 10*3/uL (ref 150–400)
RBC: 5.18 MIL/uL (ref 4.22–5.81)
RDW: 11.8 % (ref 11.5–15.5)
WBC: 6.9 10*3/uL (ref 4.0–10.5)
nRBC: 0 % (ref 0.0–0.2)

## 2022-10-31 SURGERY — DRUG INDUCED SLEEP ENDOSCOPY
Anesthesia: General | Laterality: Bilateral

## 2022-10-31 MED ORDER — ORAL CARE MOUTH RINSE: 15.0000 mL | Freq: Once | OROMUCOSAL | Status: AC

## 2022-10-31 MED ORDER — PROPOFOL 1000 MG/100ML IV EMUL
INTRAVENOUS | Status: AC
  Filled 2022-10-31: qty 100

## 2022-10-31 MED ORDER — LACTATED RINGERS IV SOLN: INTRAVENOUS | Status: DC

## 2022-10-31 MED ORDER — LIDOCAINE-EPINEPHRINE 1 %-1:100000 IJ SOLN
INTRAMUSCULAR | Status: AC
  Filled 2022-10-31: qty 1

## 2022-10-31 MED ORDER — CHLORHEXIDINE GLUCONATE 0.12 % MT SOLN
15.0000 mL | Freq: Once | OROMUCOSAL | Status: AC
  Administered 2022-10-31: 15 mL via OROMUCOSAL
  Filled 2022-10-31: qty 15

## 2022-10-31 MED ORDER — PROPOFOL 500 MG/50ML IV EMUL
INTRAVENOUS | Status: DC | PRN
  Administered 2022-10-31: 100 ug/kg/min via INTRAVENOUS

## 2022-10-31 MED ORDER — OXYMETAZOLINE HCL 0.05 % NA SOLN
NASAL | Status: AC
  Filled 2022-10-31: qty 30

## 2022-10-31 SURGICAL SUPPLY — 13 items
BAG COUNTER SPONGE SURGICOUNT (BAG) IMPLANT
BAG SPNG CNTER NS LX DISP (BAG)
CANISTER SUCT 1200ML W/VALVE (MISCELLANEOUS) ×1 IMPLANT
GLOVE BIO SURGEON STRL SZ7.5 (GLOVE) ×1 IMPLANT
KIT BASIN OR (CUSTOM PROCEDURE TRAY) ×1 IMPLANT
NDL PRECISIONGLIDE 27X1.5 (NEEDLE) IMPLANT
NEEDLE PRECISIONGLIDE 27X1.5 (NEEDLE) IMPLANT
PATTIES SURGICAL .5 X3 (DISPOSABLE) ×1 IMPLANT
SHEET MEDIUM DRAPE 40X70 STRL (DRAPES) IMPLANT
SOL ANTI FOG 6CC (MISCELLANEOUS) ×1 IMPLANT
SYR CONTROL 10ML LL (SYRINGE) IMPLANT
TOWEL GREEN STERILE FF (TOWEL DISPOSABLE) ×1 IMPLANT
TUBE CONNECTING 20X1/4 (TUBING) ×1 IMPLANT

## 2022-10-31 NOTE — Op Note (Signed)
Preop diagnosis: Obstructive sleep apnea Postop diagnosis: same Procedure: Drug-induced sleep endoscopy Surgeon: Redmond Baseman Anesth: IV sedation Compl: None Findings: There is majority anterior-posterior collapse at the velum making him a candidate for hypoglossal nerve stimulator placement.  There was also anterior-posterior collapse at the tongue base. Description:  After discussing risks, benefits, and alternatives, the patient was brought to the operative suite and placed on the operative table in the supine position.  Anesthesia was induced and the patient was given light sedation to simulate natural sleep. When the proper level was reached, an Afrin-soaked pledget was placed in the right nasal passage for a couple of minutes and then removed.  The fiberoptic laryngoscope was then passed to view the pharynx and larynx.  Findings are noted above and the exam was recorded.  After completion, the scope was removed and the patient was returned to anesthesia for wakeup and was moved to the recovery room in stable condition.

## 2022-10-31 NOTE — H&P (Signed)
Jacob Buchanan is an 52 y.o. male.   Chief Complaint: Sleep apnea HPI: 52 year old male with obstructive sleep apnea who has been unable to tolerate CPAP.  Past Medical History:  Diagnosis Date   Anemia    Atrial fibrillation (HCC)    CHF (congestive heart failure) (HCC)    ED (erectile dysfunction)    Esophageal stricture    GERD (gastroesophageal reflux disease)    Sullivan Lone syndrome    HSV (herpes simplex virus) anogenital infection    Hyperlipidemia    Incomplete right bundle branch block    Partial tear of right rotator cuff    Sleep apnea     Past Surgical History:  Procedure Laterality Date   ATRIAL FIBRILLATION ABLATION N/A 08/31/2022   Procedure: ATRIAL FIBRILLATION ABLATION;  Surgeon: Maurice Small, MD;  [facility] IN[facility];  Service: Cardiovascular;  Laterality: N/A;   CARDIOVERSION N/A 03/29/2022   Procedure: CARDIOVERSION;  Surgeon: Thurmon Fair, MD;  Location[facility] ENDOS[facility];  Service: Cardiovascular;  Laterality: N/A;   CARDIOVERSION N/A 06/07/2022   Procedure: CARDIOVERSION;  Surgeon: Chrystie Nose, MD;  Location: [facility] ENDOSCOPY;  Service: Cardiovascular;  Laterality: N/A;   COLONOSCOPY  2004 ; 2007; & 05-29-2012   last one with propofol and hemorroidectomy with sclerosing   COLONOSCOPY  09/05/2019   FINGER SURGERY  1994   repair/ reconstruction right index crush injury   INGUINAL HERNIA REPAIR Bilateral age 8   SHOULDER ARTHROSCOPY WITH LABRAL REPAIR Right 12/31/2013   Procedure: RIGHT SHOULDER ARTHROSCOPY WITH LABRAL REPAIR ;  Surgeon: Eugenia Mcalpine, MD;  Location: [facility] [address];  Service: Orthopedics;  Laterality: Right;   UPPER GI ENDOSCOPY  2015   esophageal dilation    Family History  Problem Relation Age of Onset   Lung cancer Maternal Grandfather    Other Sister        Diabetes Insipidus   Rectal cancer Sister    Mitral valve prolapse Father    Ulcers Sister        peptic   Emphysema Paternal Grandfather    Lung  cancer Paternal Grandfather    Stroke Neg Hx    Heart disease Neg Hx    Colon cancer Neg Hx    Colon polyps Neg Hx    Esophageal cancer Neg Hx    Stomach cancer Neg Hx    Social History:  reports that he has never smoked. He has never used smokeless tobacco. He reports that he does not currently use alcohol after a past usage of about 14.0 standard drinks of alcohol per week. He reports current drug use. Drug: Marijuana.  Allergies:  Allergies  Allergen Reactions   Pollen Extract     Medications Prior to Admission  Medication Sig Dispense Refill   amiodarone (PACERONE) 200 MG tablet TAKE 1 TABLET BY MOUTH DAILY 30 tablet 5   apixaban (ELIQUIS) 5 MG TABS tablet Take 1 tablet (5 mg total) by mouth 2 (two) times daily. 60 tablet 5   dapagliflozin propanediol (FARXIGA) 10 MG TABS tablet Take 1 tablet (10 mg total) by mouth daily. 90 tablet 3   ferrous sulfate 325 (65 FE) MG tablet Take 325 mg by mouth in the morning.     fluticasone (FLONASE) 50 MCG/ACT nasal spray Place 2 sprays into both nostrils daily as needed for allergies.     metoprolol succinate (TOPROL-XL) 100 MG 24 hr tablet TAKE 1 TABLET BY MOUTH DAILY. TAKE WITH OR IMMEDIATELY FOLLOWING A MEAL  90 tablet 3   Multiple Vitamin (MULTIVITAMIN) tablet Take 1 tablet by mouth 4 (four) times a week.     olopatadine (PATADAY) 0.1 % ophthalmic solution Place 1 drop into both eyes 2 (two) times daily as needed for allergies.     omeprazole (PRILOSEC) 40 MG capsule TAKE 1 CAPSULE BY MOUTH TWICE A DAY (Patient taking differently: Take 40 mg by mouth daily.) 180 capsule 1   sildenafil (VIAGRA) 100 MG tablet Take 0.5-1 tablets (50-100 mg total) by mouth daily as needed for erectile dysfunction. 90 tablet 0   tadalafil (CIALIS) 20 MG tablet Take 0.5-1 tablets (10-20 mg total) by mouth daily as needed for erectile dysfunction. 90 tablet 0   valACYclovir (VALTREX) 1000 MG tablet TAKE 1 TABLET BY MOUTH DAILY (Patient taking differently: Take 1,000  mg by mouth 3 (three) times a week.) 90 tablet 0   colchicine 0.6 MG tablet Take 1 tablet (0.6 mg total) by mouth 2 (two) times daily for 5 days. 10 tablet 0    Results for orders placed or performed during the hospital encounter of 10/31/22 (from the past 48 hour(s))  Basic metabolic panel per protocol     Status: None   Collection Time: 10/31/22 11:27 AM  Result Value Ref Range   Sodium 140 135 - 145 mmol/L   Potassium 3.9 3.5 - 5.1 mmol/L   Chloride 104 98 - 111 mmol/L   CO2 25 22 - 32 mmol/L   Glucose, Bld 90 70 - 99 mg/dL    Comment: Glucose reference range applies only to samples taken after fasting for at least 8 hours.   BUN 13 6 - 20 mg/dL   Creatinine, Ser 4.78 0.61 - 1.24 mg/dL   Calcium 9.0 8.9 - 29.5 mg/dL   GFR, Estimated >62 >13 mL/min    Comment: (NOTE) Calculated using the CKD-EPI Creatinine Equation (2021)    Anion gap 11 5 - 15    Comment: Performed at [facility], [address] [place], [place] [address]  CBC per protocol     Status: Abnormal   Collection Time: 10/31/22 11:27 AM  Result Value Ref Range   WBC 6.9 4.0 - 10.5 K/uL   RBC 5.18 4.22 - 5.81 MIL/uL   Hemoglobin 17.7 (H) 13.0 - 17.0 g/dL   HCT 84.6 96.2 - 95.2 %   MCV 96.7 80.0 - 100.0 fL   MCH 34.2 (H) 26.0 - 34.0 pg   MCHC 35.3 30.0 - 36.0 g/dL   RDW 84.1 32.4 - 40.1 %   Platelets 164 150 - 400 K/uL   nRBC 0.0 0.0 - 0.2 %    Comment: Performed at [facility], [address] [place], [place] [address]   No results found.  Review of Systems  All other systems reviewed and are negative.   Blood pressure (!) 143/88, pulse (!) 53, temperature 97.7 F (36.5 C), temperature source Oral, resp. rate 17, height 6\' 1"  (1.854 m), weight 90.3 kg, SpO2 98%. Physical Exam Constitutional:      Appearance: Normal appearance. He is normal weight.  HENT:     Head: Normocephalic and atraumatic.     Right Ear: External ear normal.     Left Ear: External ear normal.     Nose: Nose normal.      Mouth/Throat:     Mouth: Mucous membranes are moist.     Pharynx: Oropharynx is clear.  Eyes:     Extraocular Movements: Extraocular movements intact.  Conjunctiva/sclera: Conjunctivae normal.     Pupils: Pupils are equal, round, and reactive to light.  Cardiovascular:     Rate and Rhythm: Normal rate.  Pulmonary:     Effort: Pulmonary effort is normal.  Musculoskeletal:     Cervical back: Normal range of motion.  Skin:    General: Skin is warm and dry.  Neurological:     General: No focal deficit present.     Mental Status: He is alert and oriented to person, place, and time.  Psychiatric:        Mood and Affect: Mood normal.        Behavior: Behavior normal.        Thought Content: Thought content normal.        Judgment: Judgment normal.      Assessment/Plan Obstructive sleep apnea and BMI 26.25  To OR for sleep endoscopy.  Christia Reading, MD 10/31/2022, 1:29 PM

## 2022-10-31 NOTE — Transfer of Care (Signed)
Immediate Anesthesia Transfer of Care Note  Patient: Jacob Buchanan  Procedure(s) Performed: DRUG INDUCED SLEEP  ENDOSCOPY (Bilateral)  Patient Location: PACU  Anesthesia Type:MAC  Level of Consciousness: awake and alert   Airway & Oxygen Therapy: Patient Spontanous Breathing and Patient connected to nasal cannula oxygen  Post-op Assessment: Report given to RN and Post -op Vital signs reviewed and stable  Post vital signs: Reviewed and stable  Last Vitals:  Vitals Value Taken Time  BP    Temp    Pulse    Resp    SpO2      Last Pain:  Vitals:   10/31/22 1134  TempSrc:   PainSc: 0-No pain      Patients Stated Pain Goal: 0 (10/31/22 1134)  Complications: No notable events documented.

## 2022-10-31 NOTE — Brief Op Note (Signed)
10/31/2022  2:28 PM  PATIENT:  Jacob Buchanan  52 y.o. male  PRE-OPERATIVE DIAGNOSIS:  Obstructive sleep apnea Snoring  POST-OPERATIVE DIAGNOSIS:  Obstructive sleep apneaSnoring  PROCEDURE:  Procedure(s): DRUG INDUCED SLEEP  ENDOSCOPY (Bilateral)  SURGEON:  Surgeons and Role:    Christia Reading, MD - Primary  PHYSICIAN ASSISTANT:   ASSISTANTS: none   ANESTHESIA:   IV sedation  EBL:  None   BLOOD ADMINISTERED:none  DRAINS: none   LOCAL MEDICATIONS USED:  NONE  SPECIMEN:  No Specimen  DISPOSITION OF SPECIMEN:  N/A  COUNTS:  YES  TOURNIQUET:  * No tourniquets in log *  DICTATION: .Note written in EPIC  PLAN OF CARE: Discharge to home after PACU  PATIENT DISPOSITION:  PACU - hemodynamically stable.   Delay start of Pharmacological VTE agent (>24hrs) due to surgical blood loss or risk of bleeding: no

## 2022-10-31 NOTE — Progress Notes (Signed)
Dr. Jenne Pane made aware that patient took Eliquis this morning. Ok per Dr. Jenne Pane.

## 2022-11-01 ENCOUNTER — Encounter (HOSPITAL_COMMUNITY): Payer: Self-pay | Admitting: Otolaryngology

## 2022-11-01 ENCOUNTER — Ambulatory Visit (INDEPENDENT_AMBULATORY_CARE_PROVIDER_SITE_OTHER): Payer: BC Managed Care – PPO | Admitting: Family Medicine

## 2022-11-01 VITALS — BP 113/73 | HR 55 | Temp 97.8°F | Ht 73.0 in | Wt 201.2 lb

## 2022-11-01 DIAGNOSIS — J309 Allergic rhinitis, unspecified: Secondary | ICD-10-CM

## 2022-11-01 DIAGNOSIS — R222 Localized swelling, mass and lump, trunk: Secondary | ICD-10-CM | POA: Diagnosis not present

## 2022-11-01 NOTE — Anesthesia Postprocedure Evaluation (Signed)
Anesthesia Post Note  Patient: Jacob Buchanan  Procedure(s) Performed: DRUG INDUCED SLEEP  ENDOSCOPY (Bilateral)     Patient location during evaluation: PACU Anesthesia Type: General Level of consciousness: awake and alert Pain management: pain level controlled Vital Signs Assessment: post-procedure vital signs reviewed and stable Respiratory status: spontaneous breathing Cardiovascular status: stable Anesthetic complications: no   No notable events documented.  Last Vitals:  Vitals:   10/31/22 1434 10/31/22 1445  BP: 128/82 (!) 143/97  Pulse: (!) 50 (!) 48  Resp: 14 14  Temp: (!) 36.3 C (!) 36.3 C  SpO2: 100% 99%    Last Pain:  Vitals:   10/31/22 1445  TempSrc:   PainSc: 0-No pain                 Lewie Loron

## 2022-11-01 NOTE — Patient Instructions (Signed)
It was very nice to see you today!  I think your neck swelling is probably due to a side effect of the medications however since this has been going on for a month we will check a CT scan to further evaluate.  Return if symptoms worsen or fail to improve.   Take care, Dr [name]  PLEASE NOTE:  If you had any lab tests, please let us know if you have not heard back within a few days. You may see your results on mychart before we have a chance to review them but we will give you a call once they are reviewed by [place].   If we ordered any referrals today, please let us know if you have not heard from their office within the next week.   If you had any urgent prescriptions sent in today, please check with the pharmacy within an hour of our visit to make sure the prescription was transmitted appropriately.   Please try these tips to maintain a healthy lifestyle:  Eat at least 3 REAL meals and 1-2 snacks per day.  Aim for no more than 5 hours between eating.  If you eat breakfast, please do so within one hour of getting up.   Each meal should contain half fruits/vegetables, one quarter protein, and one quarter carbs (no bigger than a computer mouse)  Cut down on sweet beverages. This includes juice, soda, and sweet tea.   Drink at least 1 glass of water with each meal and aim for at least 8 glasses per day  Exercise at least 150 minutes every week.

## 2022-11-01 NOTE — Progress Notes (Signed)
   Jacob Buchanan is a 52 y.o. male who presents today for an office visit.  Assessment/Plan:  New/Acute Problems: Supraclavicular Fullness Unclear etiology though likely related to recent cardiac ablation and his cardiac medications including amiodarone.  Based on history and bilateral distribution unlikely to represent malignancy though given that this has been going on for a month we will check urgent CT neck to further evaluate.  Will hold off on labs for today however depending on results may need to have him come back to check labs including CBC, c-Met, and TSH to evaluate for amiodarone toxicity.  Depending on results of imaging and labs would consider referral back to ENT or cardiology to discuss trial off medications.   Chronic Problems Addressed Today: Allergic rhinitis Mild flare recently likely due to pollen outbreak.  He can continue Flonase.  Also recommended over-the-counter Claritin or Zyrtec.    Subjective:  HPI:  See A/P for status of chronic conditions.  Patient is here for concern for swelling on neck for the last month or so. Located on both sides.  Stable over the last several weeks.  No pain.  No obvious injuries or precipitating events.  No fever or chills.  No chest pain or shortness of breath.  No nausea or vomiting.  No intentional weight loss.  No night sweats.  He did undergo cardiac ablation about 2 months ago for atrial fibrillation and did well with this postoperatively.        Objective:  Physical Exam: BP 113/73   Pulse (!) 55   Temp 97.8 F (36.6 C)   Ht 6\' 1"  (1.854 m)   Wt 201 lb 3.2 oz (91.3 kg)   SpO2 95%   BMI 26.55 kg/m   Gen: No acute distress, resting comfortably HEENT: Fullness noted along supraclavicular fossa bilaterally.  No palpable lymph nodes or other masses noted. CV: Regular rate and rhythm with no murmurs appreciated Pulm: Normal work of breathing, clear to auscultation bilaterally with no crackles, wheezes, or  rhonchi Neuro: Grossly normal, moves all extremities Psych: Normal affect and thought content      [name] M. [name], MD 11/01/2022 12:00 PM

## 2022-11-01 NOTE — Assessment & Plan Note (Signed)
Mild flare recently likely due to pollen outbreak.  He can continue Flonase.  Also recommended over-the-counter Claritin or Zyrtec.

## 2022-11-06 ENCOUNTER — Ambulatory Visit (HOSPITAL_BASED_OUTPATIENT_CLINIC_OR_DEPARTMENT_OTHER)
Admission: RE | Admit: 2022-11-06 | Discharge: 2022-11-06 | Disposition: A | Discharge status: Home / Self Care | Payer: BC Managed Care – PPO | Source: Ambulatory Visit | Attending: Family Medicine | Admitting: Family Medicine

## 2022-11-06 DIAGNOSIS — R221 Localized swelling, mass and lump, neck: Secondary | ICD-10-CM | POA: Diagnosis not present

## 2022-11-06 DIAGNOSIS — R222 Localized swelling, mass and lump, trunk: Secondary | ICD-10-CM | POA: Insufficient documentation

## 2022-11-06 MED ORDER — IOHEXOL 300 MG/ML  SOLN
100.0000 mL | Freq: Once | INTRAMUSCULAR | Status: AC | PRN
  Administered 2022-11-06: 100 mL via INTRAVENOUS

## 2022-11-15 ENCOUNTER — Telehealth: Payer: Self-pay | Admitting: Family Medicine

## 2022-11-15 NOTE — Telephone Encounter (Signed)
Results not in yet

## 2022-11-15 NOTE — Telephone Encounter (Signed)
Pt would like a call back with imaging results 

## 2022-11-17 NOTE — Telephone Encounter (Signed)
Pt would like to know why results are not in yet, its been 2 weeks. Please advise.

## 2022-11-17 NOTE — Telephone Encounter (Signed)
See note

## 2022-11-17 NOTE — Telephone Encounter (Signed)
The radiology department is significantly backlogged due to staffing shortages. The results should come back soon but we can reach out to them to see if they have a time frame.  [name]. [name], MD 11/17/2022 2:11 PM

## 2022-11-21 NOTE — Telephone Encounter (Signed)
Called radiology reading report [phone] for results Stated results not read yet

## 2022-11-21 NOTE — Progress Notes (Signed)
Good news is his CT scan did show any obvious abnormalities that explain the fullness in his neck.  However we still do not have an explanation.  Recommend referral to ENT for further evaluation if he is agreeable.  [name]. [name], MD 11/21/2022 1:40 PM

## 2022-11-21 NOTE — Telephone Encounter (Signed)
Patient aware.

## 2022-11-22 NOTE — Telephone Encounter (Signed)
See results note.

## 2022-11-22 NOTE — Telephone Encounter (Signed)
Patient returned call in regards to imaging results. Requests call back.

## 2022-11-23 NOTE — Telephone Encounter (Signed)
See result note.  

## 2022-12-01 ENCOUNTER — Ambulatory Visit: Payer: BC Managed Care – PPO | Admitting: Cardiovascular Disease

## 2022-12-05 IMAGING — CT CT CARDIAC CORONARY ARTERY CALCIUM SCORE
3 series · 14 of 20 positions shown, 16 images · non-contrast
Comparison: None.

CLINICAL DATA: 50-year-old Caucasian male with history of
hyperlipidemia and family history of heart disease.

EXAM:
CT CARDIAC CORONARY ARTERY CALCIUM SCORE
TECHNIQUE: Non-contrast imaging through the heart was performed using
prospective ECG gating. Image post processing was performed on an
independent workstation, allowing for quantitative analysis of the
heart and coronary arteries. Note that this exam targets the heart
and the chest was not imaged in its entirety.

[Series 2: calcium scoring 2.00 qr36 bestdiast 71% hrt calciu · axial · 0.34mm/px · z∈[+1674,+1764]mm · 4 of 77 slices shown]
[im 16/77  vessel]
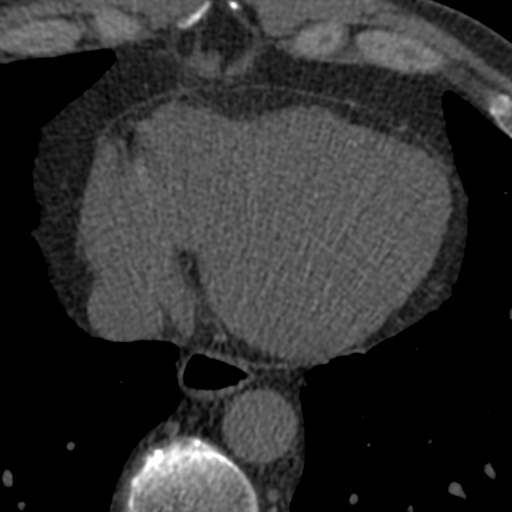
[im 31/77  vessel]
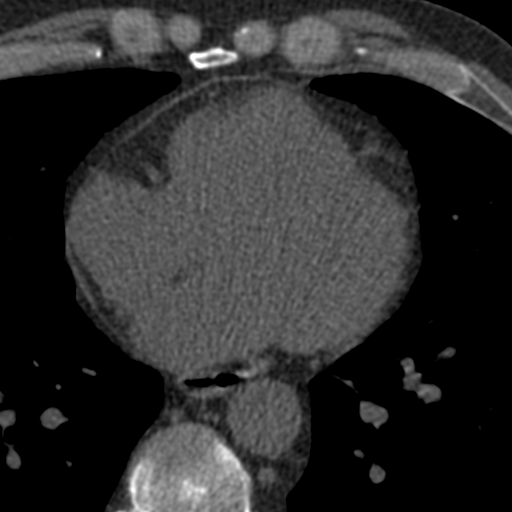
[im 46/77  vessel]
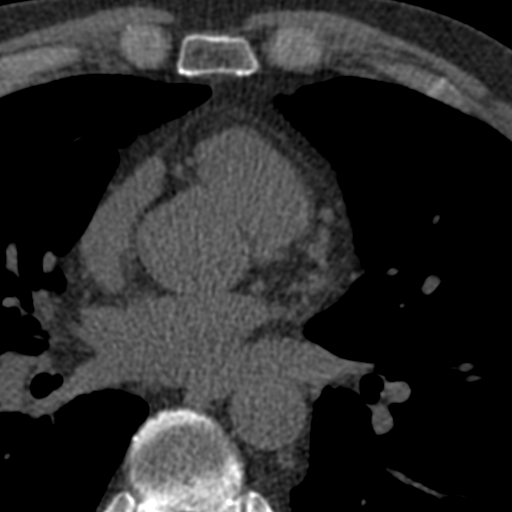
[im 61/77  vessel]
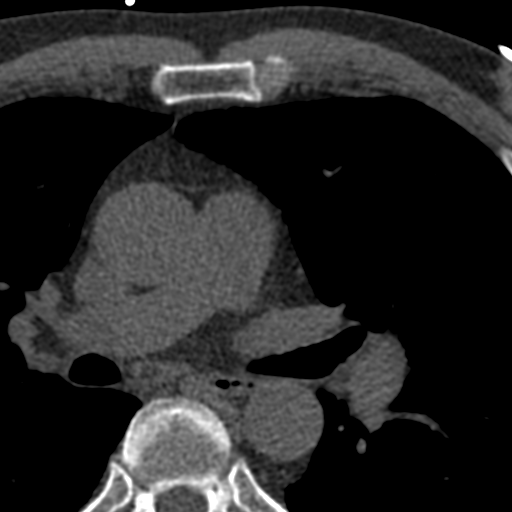

[Series 3: calcium scoring 2.00 br40 bestdiast 71% axial · axial · 0.58mm/px · z∈[+1669,+1773]mm · 5 of 80 slices shown, 7 images]
[im 14/80  vessel]
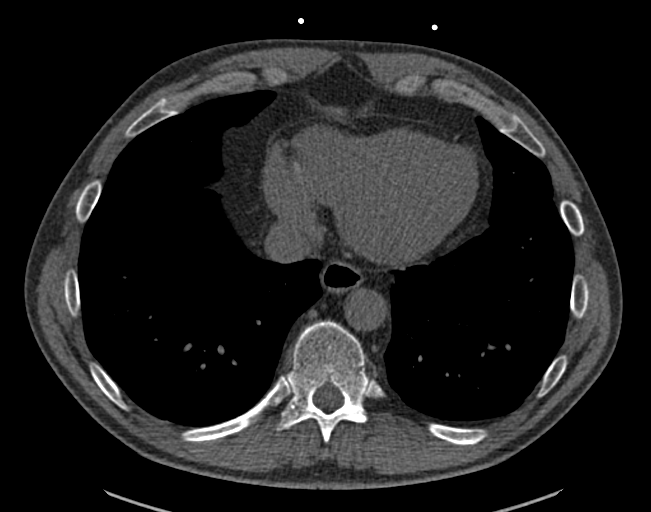
[im 14/80  lung]
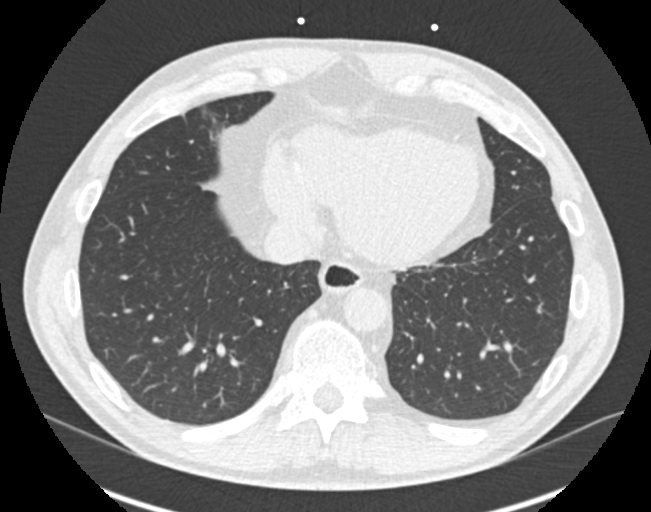
[im 27/80  vessel]
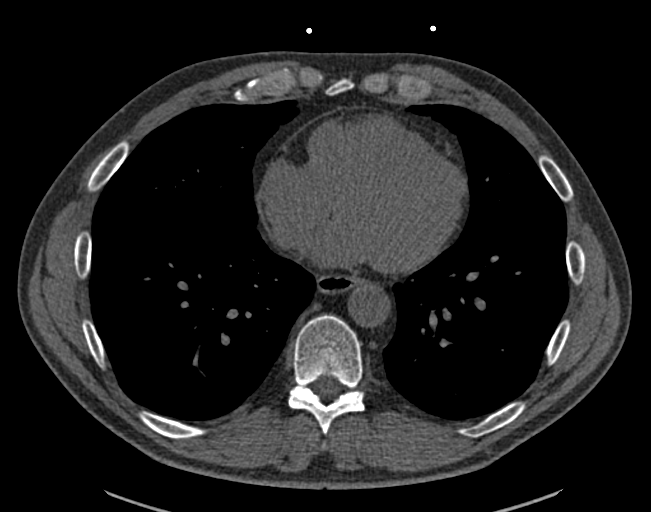
[im 40/80  vessel]
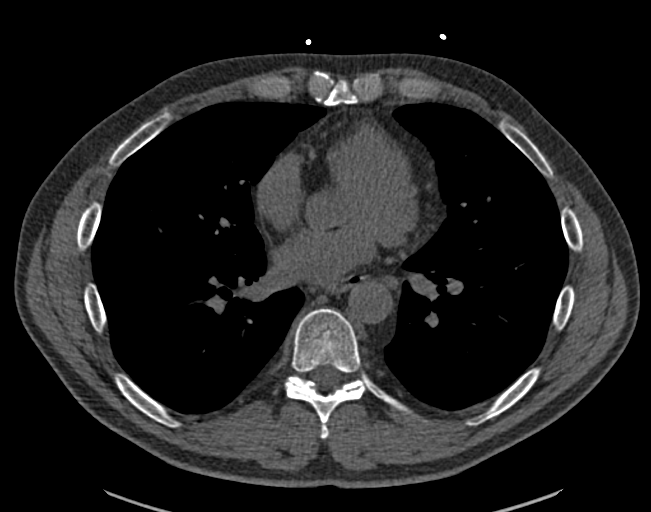
[im 53/80  vessel]
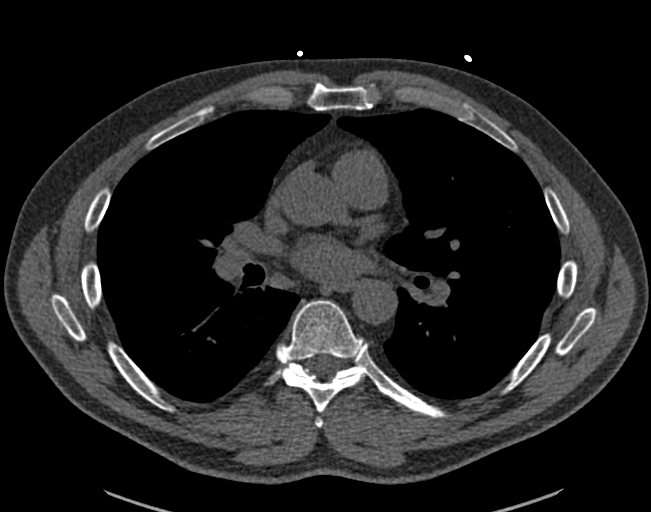
[im 66/80  vessel]
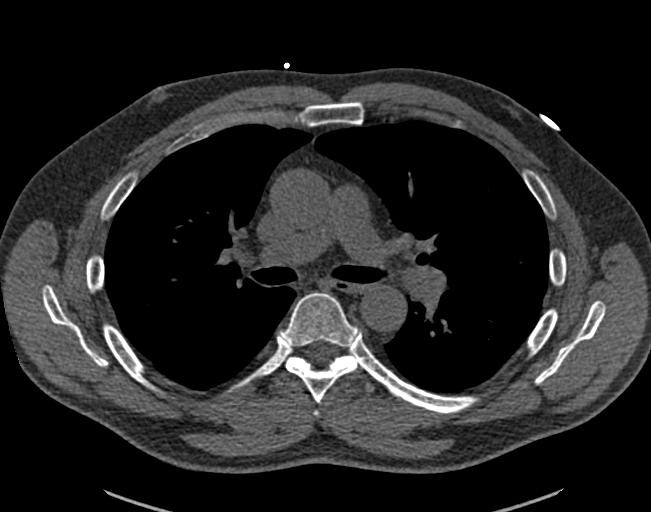
[im 66/80  lung]
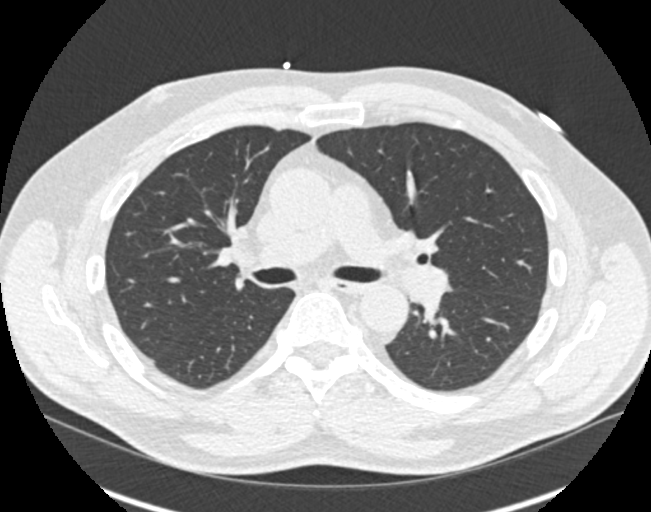

[Series 9: calcium scoring 2.00 br60 bestdiast 71% lungs · axial · 0.58mm/px · z∈[+1669,+1773]mm · 5 of 80 slices shown]
[im 14/80  vessel]
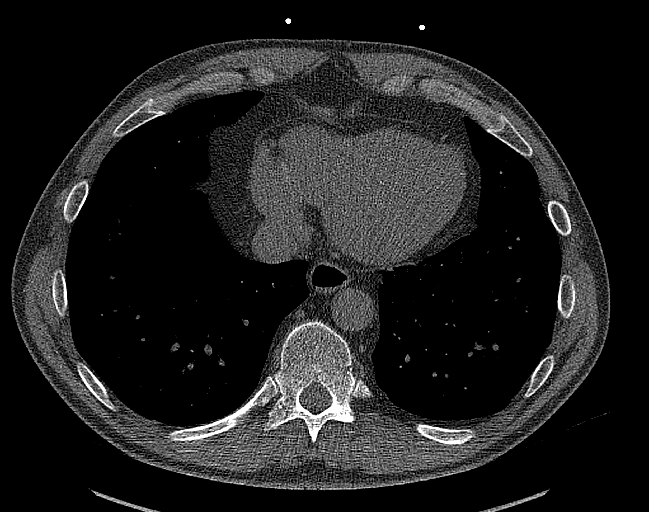
[im 27/80  vessel]
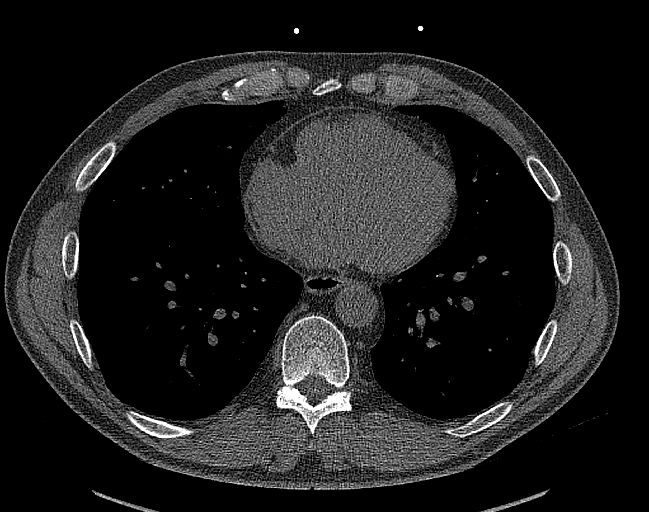
[im 40/80  vessel]
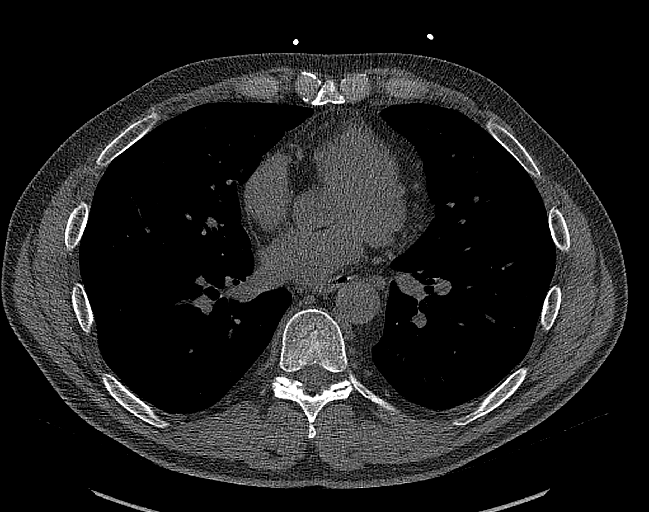
[im 53/80  vessel]
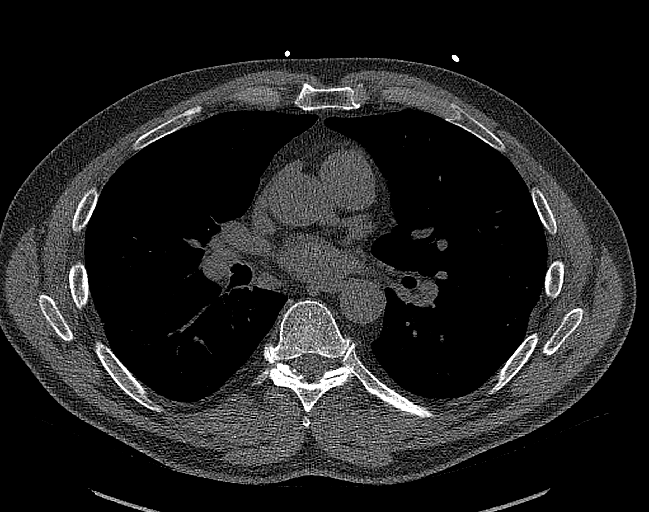
[im 66/80  vessel]
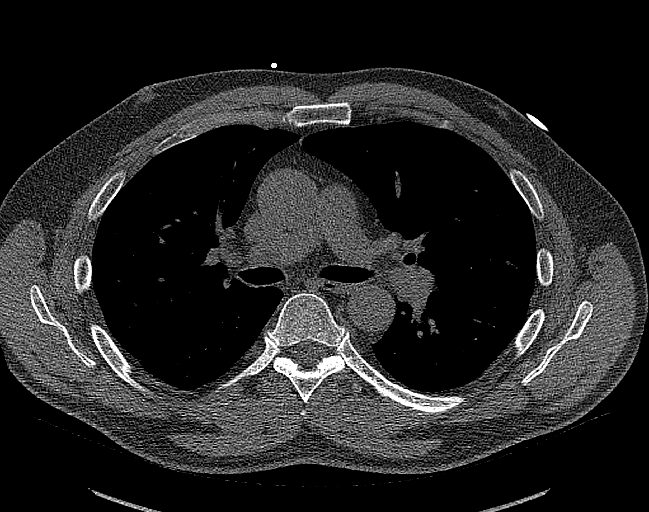

[14 of 20 positions shown; findings below may reference images not displayed]

FINDINGS: CORONARY CALCIUM SCORES:

Left Main: 0

LAD: 0

LCx: 0

RCA: 0

Total Agatston Score: 0

[HOSPITAL] percentile: 0

AORTA MEASUREMENTS:

Ascending Aorta: 32 mm

Descending Aorta: 25 mm

OTHER FINDINGS:


## 2022-12-07 ENCOUNTER — Ambulatory Visit: Payer: BC Managed Care – PPO | Admitting: Gastroenterology

## 2022-12-21 NOTE — Progress Notes (Unsigned)
Electrophysiology Office Note:   Date:  12/22/2022  ID:  [name], DOB 1970-07-14, MRN [id number]  Primary Cardiologist: None Electrophysiologist: Maurice Small, MD      History of Present Illness:   Jacob Buchanan is a 52 y.o. male with h/o persistent atrial fibrillation s/p ablation 08/2022 seen today for routine electrophysiology followup.   He was diagnosed with AF in ~2004. In December 2023, he had RSV and had recurrence of AF.  He failed DCCV in 03/2022. He was seen in the AF Clinic and was started on amiodarone for bridge to ablation. He was admitted in June 2024 for atrial fibrillation ablation.   Since last being seen in our clinic the patient reports doing very well.  Has not had any known recurrence of atrial fib - he checks his heart rhythm with a [employer].  No shortness of breath or acid reflux post procedure.   He denies chest pain, palpitations, dyspnea, PND, orthopnea, nausea, vomiting, dizziness, syncope, edema, weight gain, or early satiety.   Review of systems complete and found to be negative unless listed in HPI.   EP Information / Studies Reviewed:    EKG is ordered today. Personal review as below.  EKG Interpretation Date/Time:  Thursday December 22 2022 12:12:59 EDT Ventricular Rate:  59 PR Interval:  174 QRS Duration:  104 QT Interval:  412 QTC Calculation: 407 R Axis:   -3  Text Interpretation: Sinus bradycardia Incomplete right bundle branch block Confirmed by Canary Brim ([id number]) on 12/22/2022 12:18:50 PM   Studies:  ECHO 02/2022 > LVEF 40-45%, LV global hypokinesis, RV systolic function mildly reduced, LA mildly dilated, RA mildly dilated CT Cardiac Morphology 08/2022 > normal pulmonary vein drainage into the LA, moderate sized atrial appendage is a chicken-wing type, no thrombus in LAA, calcium score 0  Arrhythmia Persistent Atrial Fibrillation, Dx ~ 2004, recurrent 02/2022 AF Ablation 08/31/22  Risk Assessment/Calculations:     CHA2DS2-VASc Score = 1   This indicates a 0.6% annual risk of stroke. The patient's score is based upon: CHF History: 1 HTN History: 0 Diabetes History: 0 Stroke History: 0 Vascular Disease History: 0 Age Score: 0 Gender Score: 0              Physical Exam:   VS:  BP 122/86   Pulse (!) 59   Ht 6\' 1"  (1.854 m)   Wt 205 lb (93 kg)   SpO2 97%   BMI 27.05 kg/m    Wt Readings from Last 3 Encounters:  12/22/22 205 lb (93 kg)  11/01/22 201 lb 3.2 oz (91.3 kg)  10/31/22 199 lb (90.3 kg)     GEN: Well nourished, well developed in no acute distress NECK: No JVD; No carotid bruits CARDIAC: Regular rate and rhythm, no murmurs, rubs, gallops RESPIRATORY:  Clear to auscultation without rales, wheezing or rhonchi  ABDOMEN: Soft, non-tender, non-distended EXTREMITIES:  trace ankle edema; No deformity   ASSESSMENT AND PLAN:    Persistent Atrial Fibrillation  Secondary Hypercoagulable State S/p ablation 08/2022  -no further episodes of known AF  -uses Kardia Mobile to monitor rhythm  -stop amiodarone   -continue eliquis 5mg  BID  -no acid reflux or new dyspnea post procedure  CHFmrEF  LVEF 40-45%, likely tachy mediated  -euvolemic on exam  -continue toprolol 100 mg QD -assess ECHO 3 mo post ablation to review LV function, if improved may be able to come off farxiga in future   OSA  -intolerant of CPAP  Follow up with Dr. Nelly Laurence in 6 months  Signed, Canary Brim, MSN, APRN, NP-C, AGACNP-BC [facility] - Electrophysiology  12/22/2022, 12:40 PM

## 2022-12-22 ENCOUNTER — Encounter: Payer: Self-pay | Admitting: Student

## 2022-12-22 ENCOUNTER — Ambulatory Visit: Payer: BC Managed Care – PPO | Attending: Cardiovascular Disease | Admitting: Pulmonary Disease

## 2022-12-22 VITALS — BP 122/86 | HR 59 | Ht 73.0 in | Wt 205.0 lb

## 2022-12-22 DIAGNOSIS — I5022 Chronic systolic (congestive) heart failure: Secondary | ICD-10-CM | POA: Diagnosis not present

## 2022-12-22 DIAGNOSIS — I4819 Other persistent atrial fibrillation: Secondary | ICD-10-CM

## 2022-12-22 DIAGNOSIS — I5020 Unspecified systolic (congestive) heart failure: Secondary | ICD-10-CM | POA: Diagnosis not present

## 2022-12-22 DIAGNOSIS — G4733 Obstructive sleep apnea (adult) (pediatric): Secondary | ICD-10-CM | POA: Diagnosis not present

## 2022-12-22 NOTE — Patient Instructions (Signed)
Medication Instructions:  Stop amiodarone *If you need a refill on your cardiac medications before your next appointment, please call your pharmacy*  Lab Work: None ordered If you have labs (blood work) drawn today and your tests are completely normal, you will receive your results only by: MyChart Message (if you have MyChart) OR A paper copy in the mail If you have any lab test that is abnormal or we need to change your treatment, we will call you to review the results.  Testing/Procedures: Your physician has requested that you have an echocardiogram. Echocardiography is a painless test that uses sound waves to create images of your heart. It provides your doctor with information about the size and shape of your heart and how well your heart's chambers and valves are working. This procedure takes approximately one hour. There are no restrictions for this procedure. Please do NOT wear cologne, perfume, aftershave, or lotions (deodorant is allowed). Please arrive 15 minutes prior to your appointment time.   Follow-Up: At [facility], you and your health needs are our priority.  As part of our continuing mission to provide you with exceptional heart care, we have created designated Provider Care Teams.  These Care Teams include your primary Cardiologist (physician) and Advanced Practice Providers (APPs -  Physician Assistants and Nurse Practitioners) who all work together to provide you with the care you need, when you need it.   Your next appointment:   6 month(s)  Provider:   York Pellant, MD

## 2022-12-27 ENCOUNTER — Telehealth: Payer: Self-pay

## 2022-12-27 ENCOUNTER — Telehealth: Payer: Self-pay | Admitting: Cardiovascular Disease

## 2022-12-27 NOTE — Telephone Encounter (Signed)
Spoke with patient, instructed patient to contact his insurance company to assess why inspire has been denied. From a cardiac standpoint, Dr Nelly Laurence would be happy to provide paperwork needed to submit for an appeal. Patient states he will contact his insurance and follow up with [place] if needed.

## 2022-12-27 NOTE — Telephone Encounter (Signed)
Call to patient who states he was referred to Dr. Jenne Pane by [facility] neurology for [facility] device workup. He completed endoscopy with Dr. Jenne Pane on 10/31/22 and Dr. Jenne Pane did recommend him for the Inspire device but unfortunately his insurance denied him 2x. He is calling in today to see if Dr. Nelly Laurence can offer any further testing or referral as he has met his deductible for the year and if it's possible to have insurance reverse their decision so he can have the procedure this year.  Patient states he tried cpap with full face mask and oral appliance and could not wear either for more than 1 month. He states he was referred to Dr. Jenne Pane by [facility].   Advised patient that he might consider trying cpap again with a different mask, he is not willing to try cpap again at this time. Forwarded to Dr. Mayford Knife as patient is requesting to be seen by cardiology/sleep medicine as he also has afib. His goal is to see if Dr. Mayford Knife can help him reverse insurance decision.

## 2022-12-27 NOTE — Telephone Encounter (Signed)
Pt called in stating he was denied by his insurance for Inspire device recommended by another one of his providers. He asked if Dr. Nelly Laurence can help assist with this. Please advise.

## 2022-12-28 ENCOUNTER — Telehealth: Payer: Self-pay | Admitting: Family Medicine

## 2022-12-28 ENCOUNTER — Other Ambulatory Visit: Payer: Self-pay | Admitting: *Deleted

## 2022-12-28 ENCOUNTER — Encounter (INDEPENDENT_AMBULATORY_CARE_PROVIDER_SITE_OTHER): Payer: Self-pay | Admitting: Otolaryngology

## 2022-12-28 DIAGNOSIS — J309 Allergic rhinitis, unspecified: Secondary | ICD-10-CM

## 2022-12-28 NOTE — Telephone Encounter (Signed)
Referral placed.

## 2022-12-28 NOTE — Telephone Encounter (Signed)
Pt states per last appt and conversation, he does want a referral to go an ENT.

## 2022-12-29 NOTE — Telephone Encounter (Signed)
Spoke with patient and advised that Dr. Mayford Knife has reviewed the information I sent. Explained that without cpap trials we don't have much to add to his case. Advised he could try to go back to Dr. Jenne Pane since his 2nd appeal is still in process to find out what he can do to strengthen his case.  Alternatively, he could contact the neurologist who referred him to Dr. Jenne Pane to see if they can provide any supporting documentation about failed cpap trials. Patient verbalized understanding.

## 2022-12-29 NOTE — Telephone Encounter (Signed)
Spoke with Angelique Blonder [facility] Coordinator) and Imelda Pillow (Dr. Dionicio Stall assistant) at Dr. Dionicio Stall office. They state patient's 2nd appeal is still in process, was initiated 11/30/22. The previous approval for Inspire Device was denied for weight and for no cpap trials. Angelique Blonder states she will check on current appeal status and fax [place] documentation that they have.

## 2023-01-06 ENCOUNTER — Other Ambulatory Visit (HOSPITAL_COMMUNITY): Payer: Self-pay | Admitting: Physician Assistant

## 2023-01-12 ENCOUNTER — Encounter: Payer: Self-pay | Admitting: Family Medicine

## 2023-01-12 ENCOUNTER — Ambulatory Visit: Payer: BC Managed Care – PPO | Admitting: Family Medicine

## 2023-01-12 VITALS — BP 137/86 | HR 66 | Temp 96.9°F | Ht 73.0 in | Wt 202.6 lb

## 2023-01-12 DIAGNOSIS — F321 Major depressive disorder, single episode, moderate: Secondary | ICD-10-CM | POA: Insufficient documentation

## 2023-01-12 DIAGNOSIS — M9903 Segmental and somatic dysfunction of lumbar region: Secondary | ICD-10-CM | POA: Diagnosis not present

## 2023-01-12 DIAGNOSIS — N5089 Other specified disorders of the male genital organs: Secondary | ICD-10-CM | POA: Diagnosis not present

## 2023-01-12 DIAGNOSIS — M9906 Segmental and somatic dysfunction of lower extremity: Secondary | ICD-10-CM | POA: Diagnosis not present

## 2023-01-12 DIAGNOSIS — M9901 Segmental and somatic dysfunction of cervical region: Secondary | ICD-10-CM | POA: Diagnosis not present

## 2023-01-12 DIAGNOSIS — M9905 Segmental and somatic dysfunction of pelvic region: Secondary | ICD-10-CM | POA: Diagnosis not present

## 2023-01-12 LAB — CBC
HCT: 51.6 % (ref 39.0–52.0)
Hemoglobin: 17.9 g/dL — ABNORMAL HIGH (ref 13.0–17.0)
MCHC: 34.8 g/dL (ref 30.0–36.0)
MCV: 99.6 fL (ref 78.0–100.0)
Platelets: 201 10*3/uL (ref 150.0–400.0)
RBC: 5.18 Mil/uL (ref 4.22–5.81)
RDW: 12.6 % (ref 11.5–15.5)
WBC: 6.2 10*3/uL (ref 4.0–10.5)

## 2023-01-12 LAB — COMPREHENSIVE METABOLIC PANEL
ALT: 35 U/L (ref 0–53)
AST: 27 U/L (ref 0–37)
Albumin: 4.2 g/dL (ref 3.5–5.2)
Alkaline Phosphatase: 90 U/L (ref 39–117)
BUN: 19 mg/dL (ref 6–23)
CO2: 29 meq/L (ref 19–32)
Calcium: 9.3 mg/dL (ref 8.4–10.5)
Chloride: 103 meq/L (ref 96–112)
Creatinine, Ser: 1.21 mg/dL (ref 0.40–1.50)
GFR: 67.78 mL/min (ref >60.00)
Glucose, Bld: 89 mg/dL (ref 70–99)
Potassium: 4 meq/L (ref 3.5–5.1)
Sodium: 139 meq/L (ref 135–145)
Total Bilirubin: 0.9 mg/dL (ref 0.2–1.2)
Total Protein: 7.2 g/dL (ref 6.0–8.3)

## 2023-01-12 NOTE — Patient Instructions (Signed)
It was very nice to see you today!  I will refer you to see the urologist.  Will also refer you to see a therapist.  Will check blood work today.  Return if symptoms worsen or fail to improve.   Take care, Dr [name]  PLEASE NOTE:  If you had any lab tests, please let us know if you have not heard back within a few days. You may see your results on mychart before we have a chance to review them but we will give you a call once they are reviewed by [place].   If we ordered any referrals today, please let us know if you have not heard from their office within the next week.   If you had any urgent prescriptions sent in today, please check with the pharmacy within an hour of our visit to make sure the prescription was transmitted appropriately.   Please try these tips to maintain a healthy lifestyle:  Eat at least 3 REAL meals and 1-2 snacks per day.  Aim for no more than 5 hours between eating.  If you eat breakfast, please do so within one hour of getting up.   Each meal should contain half fruits/vegetables, one quarter protein, and one quarter carbs (no bigger than a computer mouse)  Cut down on sweet beverages. This includes juice, soda, and sweet tea.   Drink at least 1 glass of water with each meal and aim for at least 8 glasses per day  Exercise at least 150 minutes every week.

## 2023-01-12 NOTE — Assessment & Plan Note (Signed)
Patient asked to be referred to see a therapist.  Does admit to being more down and depressed.  He was not interested in discussing this further with me today.  We did discuss medications however he is not interested in this at this point.  No reported SI or HI.  Will place referral to see therapist.  He will reach out to me if he would like to start medication or if he has any significant change in symptoms.

## 2023-01-12 NOTE — Progress Notes (Signed)
   Jacob Buchanan is a 52 y.o. male who presents today for an office visit.  Assessment/Plan:  New/Acute Problems: Scrotal Swelling Fullness and swelling noted on exam today.  He did have a ultrasound a year ago which demonstrated a small testicular cyst.  Reassured patient that his ultrasound last year did not pick up on any signs of testicular cancer or serious etiology.  Exam today is not consistent with epididymitis or orchitis.  It is possible that his cyst may have grown in size or potentially ruptured.  Intermittent torsion is also a possibility though would expect a higher degree of pain than what he is describing.  He is not currently having any pain - low suspicion for acute torsion. It is reassuring that symptoms have improved the last couple of days.  We did discuss repeating ultrasound at this point however given recurrence of symptoms would be reasonable for him to see a urologist.  Will place referral today.  He will let us know if his symptoms do not continue to improve.  We discussed reasons to return to care or seek emergent care.  Chronic Problems Addressed Today: Depression, major, single episode, moderate (HCC) Patient asked to be referred to see a therapist.  Does admit to being more down and depressed.  He was not interested in discussing this further with me today.  We did discuss medications however he is not interested in this at this point.  No reported SI or HI.  Will place referral to see therapist.  He will reach out to me if he would like to start medication or if he has any significant change in symptoms.     Subjective:  HPI:  See assessment / plan for status of chronic conditions. Patient here today with recurrence of left sided testicular swelling. We did see him a year ago for this.  At that time obtain an ultrasound which showed small left testicular cyst and right sided testicular cyst as well.  His pain improved gradually over a few months until returning  several weeks ago.  He did have a small amount of pain with the swelling but this is improving.  This comes and goes.  No redness.  No obvious injuries or precipitating events.  No treatments tried.  He is sexually active but has no concern for STDs.  No dysuria.  No penile discharge.  No fevers or chills.  He also request to be referred to see a therapist today.  Admits to being more down and depressed recently.  No reported SI or HI.        Objective:  Physical Exam: BP 137/86   Pulse 66   Temp (!) 96.9 F (36.1 C) (Temporal)   Ht 6\' 1"  (1.854 m)   Wt 202 lb 9.6 oz (91.9 kg)   SpO2 95%   BMI 26.73 kg/m   Gen: No acute distress, resting comfortably CV: Regular rate and rhythm with no murmurs appreciated Pulm: Normal work of breathing, clear to auscultation bilaterally with no crackles, wheezes, or rhonchi GU: Normal male genitalia.  Scrotum palpated.  Fullness noted in left side of scrotum without any obvious masses or lesions.  No epididymal pain. No pain with palpation.  Neuro: Grossly normal, moves all extremities Psych: Normal affect and thought content      Aalivia Mcgraw M. [name], MD 01/12/2023 11:55 AM

## 2023-01-13 NOTE — Progress Notes (Signed)
His hemoglobin is elevated.  This is probably nothing to worry about but I would like for him to come back in a couple of weeks to recheck. Please place order for CBC with differential and peripheral smear.  [name]. [name], MD 01/13/2023 7:30 AM

## 2023-01-17 ENCOUNTER — Ambulatory Visit (HOSPITAL_COMMUNITY): Payer: BC Managed Care – PPO | Attending: Pulmonary Disease

## 2023-01-18 ENCOUNTER — Other Ambulatory Visit: Payer: Self-pay | Admitting: *Deleted

## 2023-01-18 ENCOUNTER — Ambulatory Visit (HOSPITAL_COMMUNITY): Payer: BC Managed Care – PPO | Attending: Pulmonary Disease

## 2023-01-18 DIAGNOSIS — I5020 Unspecified systolic (congestive) heart failure: Secondary | ICD-10-CM | POA: Diagnosis not present

## 2023-01-18 DIAGNOSIS — D582 Other hemoglobinopathies: Secondary | ICD-10-CM

## 2023-01-18 LAB — ECHOCARDIOGRAM COMPLETE
Area-P 1/2: 3.33 cm2
S' Lateral: 2.7 cm

## 2023-01-20 ENCOUNTER — Ambulatory Visit (INDEPENDENT_AMBULATORY_CARE_PROVIDER_SITE_OTHER): Payer: BC Managed Care – PPO | Admitting: Otolaryngology

## 2023-01-20 ENCOUNTER — Encounter (INDEPENDENT_AMBULATORY_CARE_PROVIDER_SITE_OTHER): Payer: Self-pay

## 2023-01-20 VITALS — Ht 73.0 in | Wt 202.0 lb

## 2023-01-20 DIAGNOSIS — R221 Localized swelling, mass and lump, neck: Secondary | ICD-10-CM

## 2023-01-20 NOTE — Progress Notes (Signed)
Dear Dr. [name], Here is my assessment for our mutual patient, Jacob Buchanan. Thank you for allowing me the opportunity to care for your patient. Please do not hesitate to contact me should you have any other questions. Sincerely, Dr. Jovita Kussmaul  Otolaryngology Clinic Note  HISTORY: Jacob Buchanan is a 52 y.o. male kindly referred by Dr. [name] for evaluation of neck masses. Both sides. Not changed. Not painful. No ear pain, no dysphagia, no odynophagia, no hemoptysis. No B symptoms. No antecedent event. Points to supraclavicular area. He has not noted any distinct masses, just fullness. Has noticed them for about 3 months No weightlifting recently or pain with neck movement  Additional evaluation has included CT Neck (11/2022).    Uses flonase for allergies.   AP/AC: Eliquis  PMHx: GERD, Asthma, Allergic Rhinitis, A-fib s/p ablation 2024, on Eliquis, HLD, OSA (intolerant to CPAP - AHI 75, O2 nadir 75%)  RADIOGRAPHIC EVALUATION AND INDEPENDENT REVIEW OF OTHER RECORDS:: CT Neck (11/06/2022): right septal deviation, slight inferior L max mucosal thickening, otherwise no significant sinus disease. Hypoplastic frontals. Scattered 1A, submandibular LAD but otherwise no masses or obvious thyroid nodules noted Markers present and marked - no masses noted  Prior saw Dr. Jenne Pane for inspire, candidate based on DISE but insurance denial Referral notes reviewed (2024): "Allergic rhinitis - Mild flare recently likely due to pollen outbreak.  He can continue Flonase.  Also recommended over-the-counter Claritin or Zyrtec. Supraclavicular fullness - negative CT Neck, no other Sx"  Past Medical History:  Diagnosis Date   Anemia    Atrial fibrillation (HCC)    CHF (congestive heart failure) (HCC)    ED (erectile dysfunction)    Esophageal stricture    GERD (gastroesophageal reflux disease)    Sullivan Lone syndrome    HSV (herpes simplex virus) anogenital infection    Hyperlipidemia    Incomplete  right bundle branch block    Partial tear of right rotator cuff    Sleep apnea    Past Surgical History:  Procedure Laterality Date   ATRIAL FIBRILLATION ABLATION N/A 08/31/2022   Procedure: ATRIAL FIBRILLATION ABLATION;  Surgeon: Maurice Small, MD;  Location: [facility] INVASIVE CV LAB;  Service: Cardiovascular;  Laterality: N/A;   CARDIOVERSION N/A 03/29/2022   Procedure: CARDIOVERSION;  Surgeon: Thurmon Fair, MD;  Location: [facility] ENDOSCOPY;  Service: Cardiovascular;  Laterality: N/A;   CARDIOVERSION N/A 06/07/2022   Procedure: CARDIOVERSION;  Surgeon: Chrystie Nose, MD;  Location: [facility] ENDOSCOPY;  Service: Cardiovascular;  Laterality: N/A;   COLONOSCOPY  2004 ; 2007; & 05-29-2012   last one with propofol and hemorroidectomy with sclerosing   COLONOSCOPY  09/05/2019   DRUG INDUCED ENDOSCOPY Bilateral 10/31/2022   Procedure: DRUG INDUCED SLEEP  ENDOSCOPY;  Surgeon: Christia Reading, MD;  Location: [facility] OR;  Service: ENT;  Laterality: Bilateral;   FINGER SURGERY  1994   repair/ reconstruction right index crush injury   INGUINAL HERNIA REPAIR Bilateral age 62   SHOULDER ARTHROSCOPY WITH LABRAL REPAIR Right 12/31/2013   Procedure: RIGHT SHOULDER ARTHROSCOPY WITH LABRAL REPAIR ;  Surgeon: Eugenia Mcalpine, MD;  Location: [facility] ;  Service: Orthopedics;  Laterality: Right;   UPPER GI ENDOSCOPY  2015   esophageal dilation   Family History  Problem Relation Age of Onset   Lung cancer Maternal Grandfather    Other Sister        Diabetes Insipidus   Rectal cancer Sister    Mitral valve prolapse Father    Ulcers Sister  peptic   Emphysema Paternal Grandfather    Lung cancer Paternal Grandfather    Stroke Neg Hx    Heart disease Neg Hx    Colon cancer Neg Hx    Colon polyps Neg Hx    Esophageal cancer Neg Hx    Stomach cancer Neg Hx    Social History   Tobacco Use   Smoking status: Never   Smokeless tobacco: Never   Tobacco comments:    Never smoke 03/09/22   Substance Use Topics   Alcohol use: Not Currently    Alcohol/week: 14.0 standard drinks of alcohol    Types: 6 Glasses of wine, 8 Cans of beer per week   Allergies  Allergen Reactions   Pollen Extract    Current Outpatient Medications  Medication Sig Dispense Refill   apixaban (ELIQUIS) 5 MG TABS tablet Take 1 tablet (5 mg total) by mouth 2 (two) times daily. 60 tablet 5   dapagliflozin propanediol (FARXIGA) 10 MG TABS tablet Take 1 tablet (10 mg total) by mouth daily. 90 tablet 3   ferrous sulfate 325 (65 FE) MG tablet Take 325 mg by mouth in the morning.     fluticasone (FLONASE) 50 MCG/ACT nasal spray Place 2 sprays into both nostrils daily as needed for allergies.     metoprolol succinate (TOPROL-XL) 100 MG 24 hr tablet TAKE 1 TABLET BY MOUTH DAILY. TAKE WITH OR IMMEDIATELY FOLLOWING A MEAL 90 tablet 3   Multiple Vitamin (MULTIVITAMIN) tablet Take 1 tablet by mouth 4 (four) times a week.     olopatadine (PATADAY) 0.1 % ophthalmic solution Place 1 drop into both eyes 2 (two) times daily as needed for allergies.     omeprazole (PRILOSEC) 40 MG capsule TAKE 1 CAPSULE BY MOUTH TWICE A DAY (Patient taking differently: Take 40 mg by mouth daily.) 180 capsule 1   sildenafil (VIAGRA) 100 MG tablet Take 0.5-1 tablets (50-100 mg total) by mouth daily as needed for erectile dysfunction. 90 tablet 0   tadalafil (CIALIS) 20 MG tablet Take 0.5-1 tablets (10-20 mg total) by mouth daily as needed for erectile dysfunction. 90 tablet 0   valACYclovir (VALTREX) 1000 MG tablet TAKE 1 TABLET BY MOUTH DAILY (Patient taking differently: Take 1,000 mg by mouth 3 (three) times a week.) 90 tablet 0   No current facility-administered medications for this visit.   Ht 6\' 1"  (1.854 m)   Wt 202 lb (91.6 kg)   BMI 26.65 kg/m   PHYSICAL EXAM:  Ht 6\' 1"  (1.854 m)   Wt 202 lb (91.6 kg)   BMI 26.65 kg/m    Salient findings:  CN II-XII intact  Bilateral EAC clear and TM intact with well pneumatized middle ear  spaces Nose: Anterior rhinoscopy reveals septum deviates right.No masses on anterior rhinoscopy No lesions of oral cavity/oropharynx; dentition fair No obviously palpable neck masses/lymphadenopathy/thyromegaly; over the area in question over bilateral supraclavicular fossa, slight fullness but this is symmetric and I am unable to palpate any masses No respiratory distress or stridor   ASSESSMENT:  52 yo M with A-fib on Eliquis 1. Fullness of neck   Mr. Richman is currently asymptomatic, and noted bilateral supraclavicular fullness. His CT recently done for this is without any masses, and on exam, I am unable to palpate any masses. The area is quite soft, symmetric, and appears to may just have some fat deposition perhaps. He does not smoke and does not have any other risk factors for epithelial H&N malignancy.  Regardless, I  offered him TFL to rule out any other lesions, and repeat examination in about 6 months.  He would like to follow up as needed and he will contact us should he notice any change or other concerning symptoms.   Thank you for allowing me the opportunity to care for your patient. Please do not hesitate to contact me should you have any other questions.  Sincerely, Jovita Kussmaul, MD Otolarynoglogist (ENT), [facility] Phone: [phone] Fax: [phone]  01/20/2023, 12:51 PM   MDM:  Level 3: [phone] Complexity/Problems addressed: low - chronic problem Data complexity: mod - independent interpretation CT - Morbidity: low

## 2023-01-27 ENCOUNTER — Other Ambulatory Visit: Payer: BC Managed Care – PPO

## 2023-01-27 DIAGNOSIS — D582 Other hemoglobinopathies: Secondary | ICD-10-CM | POA: Diagnosis not present

## 2023-01-27 NOTE — Addendum Note (Signed)
Addended by: Nash Shearer D on: 01/27/2023 11:07 AM   Modules accepted: Orders

## 2023-01-30 LAB — CBC WITH DIFFERENTIAL/PLATELET
Absolute Lymphocytes: 1224 {cells}/uL (ref 850–3900)
Absolute Monocytes: 509 {cells}/uL (ref 200–950)
Basophils Absolute: 48 {cells}/uL (ref 0–200)
Basophils Relative: 1 %
Eosinophils Absolute: 110 {cells}/uL (ref 15–500)
Eosinophils Relative: 2.3 %
HCT: 52.8 % — ABNORMAL HIGH (ref 38.5–50.0)
Hemoglobin: 17.9 g/dL — ABNORMAL HIGH (ref 13.2–17.1)
MCH: 34 pg — ABNORMAL HIGH (ref 27.0–33.0)
MCHC: 33.9 g/dL (ref 32.0–36.0)
MCV: 100.2 fL — ABNORMAL HIGH (ref 80.0–100.0)
MPV: 9.7 fL (ref 7.5–12.5)
Monocytes Relative: 10.6 %
Neutro Abs: 2909 {cells}/uL (ref 1500–7800)
Neutrophils Relative %: 60.6 %
Platelets: 221 10*3/uL (ref 140–400)
RBC: 5.27 10*6/uL (ref 4.20–5.80)
RDW: 12 % (ref 11.0–15.0)
Total Lymphocyte: 25.5 %
WBC: 4.8 10*3/uL (ref 3.8–10.8)

## 2023-01-30 LAB — PATHOLOGIST SMEAR REVIEW

## 2023-01-31 NOTE — Progress Notes (Signed)
Hemoglobin is still elevated.  I do not have a good explanation for why this is elevated.  This is probably nothing significant however it would be a good idea for him to see a hematologist for further testing.  Please place referral if he is agreeable.

## 2023-02-22 ENCOUNTER — Other Ambulatory Visit: Payer: Self-pay | Admitting: Family Medicine

## 2023-03-10 ENCOUNTER — Telehealth: Payer: Self-pay | Admitting: Family Medicine

## 2023-03-10 NOTE — Telephone Encounter (Signed)
 Patient called in regards to labs from November . Patient believed referral to hematologist had already been sent . Informed him we were waiting on his approval . Patient states he agrees with plan of care and would like referral to hematologist sent .

## 2023-03-14 ENCOUNTER — Other Ambulatory Visit: Payer: Self-pay | Admitting: *Deleted

## 2023-03-14 DIAGNOSIS — D649 Anemia, unspecified: Secondary | ICD-10-CM

## 2023-03-14 NOTE — Telephone Encounter (Signed)
 Referral placed.

## 2023-04-12 ENCOUNTER — Telehealth: Payer: Self-pay | Admitting: Cardiovascular Disease

## 2023-04-12 MED ORDER — DAPAGLIFLOZIN PROPANEDIOL 10 MG PO TABS: 10.0000 mg | ORAL_TABLET | Freq: Every day | ORAL | 2 refills | Status: DC

## 2023-04-12 NOTE — Telephone Encounter (Signed)
 Pt's medication was sent to pt's pharmacy as requested. Confirmation received.

## 2023-04-12 NOTE — Telephone Encounter (Signed)
*  STAT* If patient is at the pharmacy, call can be transferred to refill team.   1. Which medications need to be refilled? (please list name of each medication and dose if known) dapagliflozin  propanediol (FARXIGA ) 10 MG TABS tablet      4. Which pharmacy/location (including street and city if local pharmacy) is medication to be sent to? [id number] - [place], [place] - [address]     5. Do they need a 30 day or 90 day supply? 90

## 2023-04-13 ENCOUNTER — Telehealth: Payer: Self-pay | Admitting: Pharmacy Technician

## 2023-04-13 ENCOUNTER — Other Ambulatory Visit (HOSPITAL_COMMUNITY): Payer: Self-pay

## 2023-04-13 NOTE — Telephone Encounter (Signed)
 Pharmacy Patient Advocate Encounter   Received notification from CoverMyMeds that prior authorization for Farxiga  is required/requested.   Insurance verification completed.   The patient is insured through [employer] .   Per test claim: PA required; PA submitted to above mentioned insurance via CoverMyMeds Key/confirmation #/EOC [id number] Status is pending

## 2023-04-14 ENCOUNTER — Other Ambulatory Visit (HOSPITAL_COMMUNITY): Payer: Self-pay

## 2023-04-14 NOTE — Telephone Encounter (Signed)
 Sent over documentation that pt takes for heart failure

## 2023-04-17 ENCOUNTER — Other Ambulatory Visit (HOSPITAL_COMMUNITY): Payer: Self-pay

## 2023-04-17 NOTE — Telephone Encounter (Signed)
 Pharmacy Patient Advocate Encounter  Received notification from [employer] that Prior Authorization for Farxiga  has been DENIED.  Full denial letter will be uploaded to the media tab. See denial reason below.  Jardiance  is preferred   PA #/Case ID/Reference #: [id number]

## 2023-04-18 ENCOUNTER — Telehealth: Payer: Self-pay | Admitting: Cardiovascular Disease

## 2023-04-18 MED ORDER — DAPAGLIFLOZIN PROPANEDIOL 10 MG PO TABS: 10.0000 mg | ORAL_TABLET | Freq: Every day | ORAL | 2 refills | Status: DC

## 2023-04-18 NOTE — Telephone Encounter (Signed)
Pt's medication was sent to pt's pharmacy as requested. Confirmation received.

## 2023-04-18 NOTE — Telephone Encounter (Signed)
*  STAT* If patient is at the pharmacy, call can be transferred to refill team.   1. Which medications need to be refilled? (please list name of each medication and dose if known)   dapagliflozin propanediol (FARXIGA) 10 MG TABS tablet   2. Would you like to learn more about the convenience, safety, & potential cost savings by using the [facility]?   3. Are you open to using the [facility] (Type [facility]. ).  4. Which pharmacy/location (including street and city if local pharmacy) is medication to be sent to?  [facility] [id number] - [place], [place] - [address]   5. Do they need a 30 day or 90 day supply?  30 day  Patient stated he is completely out of this medication.

## 2023-04-20 MED ORDER — DAPAGLIFLOZIN PROPANEDIOL 10 MG PO TABS: 10.0000 mg | ORAL_TABLET | Freq: Every day | ORAL | 0 refills | Status: DC

## 2023-04-20 NOTE — Telephone Encounter (Signed)
Spoke with patient, needs to be established with general cardiology. Made appointment on 2/27 at 2:20 with Jacob Buchanan. Provided 2 weeks worth of samples of farxiga 10 mg ok per Canary Brim. Patient to discuss continued need for farxiga and switching to jardiance if needed. Patient thankful for returned call, no further needs

## 2023-04-20 NOTE — Addendum Note (Signed)
Addended by: Sherle Poe R on: 04/20/2023 05:20 PM   Modules accepted: Orders

## 2023-04-20 NOTE — Telephone Encounter (Signed)
Patient is following up. He states he is completely out of [place] and would like to discuss going on Jardiance instead. Please advise.

## 2023-04-25 ENCOUNTER — Inpatient Hospital Stay: Payer: 59

## 2023-04-25 ENCOUNTER — Encounter: Payer: Self-pay | Admitting: Oncology

## 2023-04-25 ENCOUNTER — Inpatient Hospital Stay: Payer: 59 | Attending: Oncology | Admitting: Oncology

## 2023-04-25 VITALS — BP 132/94 | HR 73 | Temp 98.0°F | Resp 18 | Wt 214.1 lb

## 2023-04-25 DIAGNOSIS — K219 Gastro-esophageal reflux disease without esophagitis: Secondary | ICD-10-CM | POA: Diagnosis not present

## 2023-04-25 DIAGNOSIS — Z825 Family history of asthma and other chronic lower respiratory diseases: Secondary | ICD-10-CM

## 2023-04-25 DIAGNOSIS — D751 Secondary polycythemia: Secondary | ICD-10-CM

## 2023-04-25 DIAGNOSIS — I480 Paroxysmal atrial fibrillation: Secondary | ICD-10-CM | POA: Diagnosis not present

## 2023-04-25 DIAGNOSIS — Z7289 Other problems related to lifestyle: Secondary | ICD-10-CM | POA: Diagnosis not present

## 2023-04-25 DIAGNOSIS — G4730 Sleep apnea, unspecified: Secondary | ICD-10-CM | POA: Diagnosis not present

## 2023-04-25 DIAGNOSIS — E785 Hyperlipidemia, unspecified: Secondary | ICD-10-CM

## 2023-04-25 DIAGNOSIS — Z79899 Other long term (current) drug therapy: Secondary | ICD-10-CM | POA: Insufficient documentation

## 2023-04-25 DIAGNOSIS — Z800 Family history of malignant neoplasm of digestive organs: Secondary | ICD-10-CM

## 2023-04-25 DIAGNOSIS — Z8249 Family history of ischemic heart disease and other diseases of the circulatory system: Secondary | ICD-10-CM

## 2023-04-25 DIAGNOSIS — F1290 Cannabis use, unspecified, uncomplicated: Secondary | ICD-10-CM | POA: Diagnosis not present

## 2023-04-25 DIAGNOSIS — D7589 Other specified diseases of blood and blood-forming organs: Secondary | ICD-10-CM | POA: Diagnosis not present

## 2023-04-25 DIAGNOSIS — Z801 Family history of malignant neoplasm of trachea, bronchus and lung: Secondary | ICD-10-CM | POA: Diagnosis not present

## 2023-04-25 DIAGNOSIS — Z8379 Family history of other diseases of the digestive system: Secondary | ICD-10-CM | POA: Insufficient documentation

## 2023-04-25 DIAGNOSIS — Z7901 Long term (current) use of anticoagulants: Secondary | ICD-10-CM | POA: Diagnosis not present

## 2023-04-25 DIAGNOSIS — Z833 Family history of diabetes mellitus: Secondary | ICD-10-CM | POA: Diagnosis not present

## 2023-04-25 DIAGNOSIS — Z0000 Encounter for general adult medical examination without abnormal findings: Secondary | ICD-10-CM | POA: Insufficient documentation

## 2023-04-25 LAB — CMP (CANCER CENTER ONLY)
ALT: 33 U/L (ref 0–44)
AST: 30 U/L (ref 15–41)
Albumin: 4.2 g/dL (ref 3.5–5.0)
Alkaline Phosphatase: 90 U/L (ref 38–126)
Anion gap: 6 (ref 5–15)
BUN: 15 mg/dL (ref 6–20)
CO2: 30 mmol/L (ref 22–32)
Calcium: 9.6 mg/dL (ref 8.9–10.3)
Chloride: 103 mmol/L (ref 98–111)
Creatinine: 1.3 mg/dL — ABNORMAL HIGH (ref 0.61–1.24)
GFR, Estimated: >60 mL/min (ref >60)
Glucose, Bld: 110 mg/dL — ABNORMAL HIGH (ref 70–99)
Potassium: 4.1 mmol/L (ref 3.5–5.1)
Sodium: 139 mmol/L (ref 135–145)
Total Bilirubin: 0.9 mg/dL (ref 0.0–1.2)
Total Protein: 7.6 g/dL (ref 6.5–8.1)

## 2023-04-25 LAB — VITAMIN B12: Vitamin B-12: 355 pg/mL (ref 180–914)

## 2023-04-25 LAB — FOLATE: Folate: 18.3 ng/mL (ref >5.9)

## 2023-04-25 LAB — CBC WITH DIFFERENTIAL (CANCER CENTER ONLY)
Abs Immature Granulocytes: 0.03 10*3/uL (ref 0.00–0.07)
Basophils Absolute: 0.1 10*3/uL (ref 0.0–0.1)
Basophils Relative: 1 %
Eosinophils Absolute: 0.1 10*3/uL (ref 0.0–0.5)
Eosinophils Relative: 2 %
HCT: 50.6 % (ref 39.0–52.0)
Hemoglobin: 18.3 g/dL — ABNORMAL HIGH (ref 13.0–17.0)
Immature Granulocytes: 1 %
Lymphocytes Relative: 29 %
Lymphs Abs: 1.7 10*3/uL (ref 0.7–4.0)
MCH: 34.4 pg — ABNORMAL HIGH (ref 26.0–34.0)
MCHC: 36.2 g/dL — ABNORMAL HIGH (ref 30.0–36.0)
MCV: 95.1 fL (ref 80.0–100.0)
Monocytes Absolute: 0.7 10*3/uL (ref 0.1–1.0)
Monocytes Relative: 12 %
Neutro Abs: 3.3 10*3/uL (ref 1.7–7.7)
Neutrophils Relative %: 55 %
Platelet Count: 186 10*3/uL (ref 150–400)
RBC: 5.32 MIL/uL (ref 4.22–5.81)
RDW: 11.9 % (ref 11.5–15.5)
WBC Count: 5.9 10*3/uL (ref 4.0–10.5)
nRBC: 0 % (ref 0.0–0.2)

## 2023-04-25 LAB — IRON AND TIBC
Iron: 167 ug/dL (ref 45–182)
Saturation Ratios: 40 % — ABNORMAL HIGH (ref 17.9–39.5)
TIBC: 421 ug/dL (ref 250–450)
UIBC: 254 ug/dL

## 2023-04-25 LAB — LACTATE DEHYDROGENASE: LDH: 181 U/L (ref 98–192)

## 2023-04-25 LAB — FERRITIN: Ferritin: 65 ng/mL (ref 24–336)

## 2023-04-25 NOTE — Assessment & Plan Note (Signed)
Up to date on colonoscopy (2021) and endoscopy.

## 2023-04-25 NOTE — Progress Notes (Signed)
[facility]  HEMATOLOGY CLINIC CONSULTATION NOTE    PATIENT NAME: Jacob Buchanan   MR#: [id number] DOB: 1970-07-18  DATE OF SERVICE: 04/25/2023   REFERRING PHYSICIAN  Ardith Dark, MD   Patient Care Team: Ardith Dark, MD as PCP - General (Family Medicine) Mealor, Roberts Gaudy, MD as PCP - Electrophysiology (Cardiology)    REASON FOR CONSULTATION/ CHIEF COMPLAINT: Polycythemia  ASSESSMENT & PLAN:  Jacob Buchanan is a 53 y.o. gentleman with past medical history of atrial fibrillation, GERD, Gilbert's syndrome, dyslipidemia, sleep apnea (not on CPAP), was referred to our clinic for evaluation of polycythemia.  Polycythemia Elevated hemoglobin (17.9) and hematocrit (52.8) noted in November 2024.   Differential includes primary polycythemia (bone marrow mutation) and secondary polycythemia (e.g., sleep apnea).   Risks include increased blood viscosity, potential for clots, heart issues, or stroke, especially with hematocrit >55 or >45 if JAK2 mutation is present.  Labs today showed persistent mild polycythemia with hemoglobin of 18.3, hematocrit 50.6, MCV 95.  White count and platelet count are normal.  Creatinine 1.3, otherwise unremarkable CMP.  LDH and ferritin are within normal limits.  We will pursue additional workup for polycythemia.  Checked erythropoietin level.  Will also obtain JAK2 mutation testing with reflex analysis to rule out primary myeloproliferative neoplasm.  In his case, most likely etiology for polycythemia is untreated OSA, causing secondary polycythemia.  - Schedule phone call visit in two weeks to discuss results  - Plan for phlebotomy if hematocrit >55 or if JAK2 mutation positive  - Follow-up visit in three months.  Depending on above results, we will reschedule appointment to a sooner date if needed.  Macrocytosis Intermittent macrocytosis, likely alcohol induced.  Will check B12 and folate today.  Will follow-up on  the results.  Paroxysmal atrial fibrillation (HCC) Diagnosed one year ago, currently on Eliquis BID. No recent AFib episodes, heart rate regular on examination. - Continue Eliquis and Farxiga as prescribed - Follow-up with electrophysiologist next month  Healthcare maintenance Up to date on colonoscopy (2021) and endoscopy.   I reviewed lab results and outside records for this visit and discussed relevant results with the patient. Diagnosis, plan of care and treatment options were also discussed in detail with the patient. Opportunity provided to ask questions and answers provided to his apparent satisfaction. Provided instructions to call our clinic with any problems, questions or concerns prior to return visit. I recommended to continue follow-up with PCP and sub-specialists. He verbalized understanding and agreed with the plan. No barriers to learning was detected.  Meryl Crutch, MD  04/25/2023 2:36 PM  [facility] - A DEPT OF [facility] [address] [place] [address] Dept: [phone] Dept Fax: [phone]   HISTORY OF PRESENTING ILLNESS:   Discussed the use of AI scribe software for clinical note transcription with the patient, who gave verbal consent to proceed.   He has had consistently elevated hemoglobin levels over the past 15 months. The patient noticed the high levels and requested the referral. The patient denies any history of smoking or use of testosterone supplements. The patient has sleep apnea but is not using any treatment for it due to discomfort with the CPAP machine and mouth guard. The patient also reports daily alcohol consumption. The patient has not started any new medications recently. The patient denies any symptoms of chest pain, trouble breathing, dizziness, or lightheadedness. The patient's appetite has been stable  and he has been maintaining his weight.  He denies any history of  blood clots.   MEDICAL HISTORY:  Past Medical History:  Diagnosis Date   Anemia    Atrial fibrillation (HCC)    CHF (congestive heart failure) (HCC)    ED (erectile dysfunction)    Esophageal stricture    GERD (gastroesophageal reflux disease)    Sullivan Lone syndrome    HSV (herpes simplex virus) anogenital infection    Hyperlipidemia    Incomplete right bundle branch block    Partial tear of right rotator cuff    Sleep apnea     SURGICAL HISTORY: Past Surgical History:  Procedure Laterality Date   ATRIAL FIBRILLATION ABLATION N/A 08/31/2022   Procedure: ATRIAL FIBRILLATION ABLATION;  Surgeon: Maurice Small, MD;  Location: [facility] INVASIVE CV LAB;  Service: Cardiovascular;  Laterality: N/A;   CARDIOVERSION N/A 03/29/2022   Procedure: CARDIOVERSION;  Surgeon: Thurmon Fair, MD;  Location: [facility] ENDOSCOPY;  Service: Cardiovascular;  Laterality: N/A;   CARDIOVERSION N/A 06/07/2022   Procedure: CARDIOVERSION;  Surgeon: Chrystie Nose, MD;  Location: [facility] ENDOSCOPY;  Service: Cardiovascular;  Laterality: N/A;   COLONOSCOPY  2004 ; 2007; & 05-29-2012   last one with propofol and hemorroidectomy with sclerosing   COLONOSCOPY  09/05/2019   DRUG INDUCED ENDOSCOPY Bilateral 10/31/2022   Procedure: DRUG INDUCED SLEEP  ENDOSCOPY;  Surgeon: Christia Reading, MD;  Location: [facility] OR;  Service: ENT;  Laterality: Bilateral;   FINGER SURGERY  1994   repair/ reconstruction right index crush injury   INGUINAL HERNIA REPAIR Bilateral age 45   SHOULDER ARTHROSCOPY WITH LABRAL REPAIR Right 12/31/2013   Procedure: RIGHT SHOULDER ARTHROSCOPY WITH LABRAL REPAIR ;  Surgeon: Eugenia Mcalpine, MD;  Location: [facility] [place];  Service: Orthopedics;  Laterality: Right;   UPPER GI ENDOSCOPY  2015   esophageal dilation    SOCIAL HISTORY: Social History   Socioeconomic History   Marital status: Divorced    Spouse name: Not on file   Number of children: 0   Years of education: Not on file   Highest  education level: Master's degree (e.g., MA, MS, MEng, MEd, MSW, MBA)  Occupational History   Occupation: landscaper-self  employed  Tobacco Use   Smoking status: Never   Smokeless tobacco: Never   Tobacco comments:    Never smoke 03/09/22  Vaping Use   Vaping status: Never Used  Substance and Sexual Activity   Alcohol use: Not Currently    Alcohol/week: 14.0 standard drinks of alcohol    Types: 6 Glasses of wine, 8 Cans of beer per week   Drug use: Yes    Types: Marijuana    Comment: routine use of marijuana, occasional mushrooms   Sexual activity: Not on file  Other Topics Concern   Not on file  Social History Narrative   Not on file   Social Drivers of Health   Financial Resource Strain: Low Risk  (03/01/2022)   Overall Financial Resource Strain (CARDIA)    Difficulty of Paying Living Expenses: Not very hard  Food Insecurity: No Food Insecurity (04/25/2023)   Hunger Vital Sign    Worried About Running Out of Food in the Last Year: Never true    Ran Out of Food in the Last Year: Never true  Transportation Needs: No Transportation Needs (06/01/2022)   [facility]   Transportation    In the past 12 months, has lack of reliable transportation kept you from medical appointments,  meetings, work or from getting things needed for daily living? : No  Physical Activity: Not on file  Stress: Not on file  Social Connections: Not on file  Intimate Partner Violence: Not At Risk (04/25/2023)   Humiliation, Afraid, Rape, and Kick questionnaire    Fear of Current or Ex-Partner: No    Emotionally Abused: No    Physically Abused: No    Sexually Abused: No    FAMILY HISTORY: Family History  Problem Relation Age of Onset   Lung cancer Maternal Grandfather    Other Sister        Diabetes Insipidus   Rectal cancer Sister    Mitral valve prolapse Father    Ulcers Sister        peptic   Emphysema Paternal Grandfather    Lung cancer Paternal Grandfather     Stroke Neg Hx    Heart disease Neg Hx    Colon cancer Neg Hx    Colon polyps Neg Hx    Esophageal cancer Neg Hx    Stomach cancer Neg Hx     CURRENT MEDICATIONS   Current Outpatient Medications  Medication Instructions   apixaban (ELIQUIS) 5 mg, Oral, 2 times daily   dapagliflozin propanediol (FARXIGA) 10 mg, Oral, Daily before breakfast   ferrous sulfate 325 mg, Every morning   fluticasone (FLONASE) 50 MCG/ACT nasal spray 2 sprays, Daily PRN   metoprolol succinate (TOPROL-XL) 100 mg, Oral, Daily, TAKE WITH OR IMMEDIATELY FOLLOWING A MEAL.   Multiple Vitamin (MULTIVITAMIN) tablet 1 tablet, 4 times weekly   olopatadine (PATADAY) 0.1 % ophthalmic solution 1 drop, 2 times daily PRN   omeprazole (PRILOSEC) 40 MG capsule TAKE 1 CAPSULE BY MOUTH TWICE A DAY   sildenafil (VIAGRA) 50-100 mg, Oral, Daily PRN   tadalafil (CIALIS) 10-20 mg, Oral, Daily PRN   valACYclovir (VALTREX) 1,000 mg, Oral, Daily     ALLERGIES  He is allergic to pollen extract.  REVIEW OF SYSTEMS:    Review of Systems - Oncology  All of the other pertinent systems were reviewed with the patient and were unremarkable except as mentioned above in HPI.  PHYSICAL EXAMINATION:    Onc Performance Status - 04/25/23 1000       ECOG Perf Status   ECOG Perf Status Fully active, able to carry on all pre-disease performance without restriction      KPS SCALE   KPS % SCORE Normal, no compliants, no evidence of disease             Vitals:   04/25/23 1018  BP: (!) 132/94  Pulse: 73  Resp: 18  Temp: 98 F (36.7 C)  SpO2: 97%   Filed Weights   04/25/23 1018  Weight: 214 lb 2 oz (97.1 kg)    Physical Exam Constitutional:      General: He is not in acute distress.    Appearance: Normal appearance.  HENT:     Head: Normocephalic and atraumatic.  Eyes:     General: No scleral icterus.    Conjunctiva/sclera: Conjunctivae normal.  Cardiovascular:     Rate and Rhythm: Normal rate and regular rhythm.      Heart sounds: Normal heart sounds.  Pulmonary:     Effort: Pulmonary effort is normal.     Breath sounds: Normal breath sounds.  Abdominal:     General: There is no distension.  Musculoskeletal:     Right lower leg: No edema.     Left lower leg: No edema.  Neurological:     General: No focal deficit present.     Mental Status: He is alert and oriented to person, place, and time.  Psychiatric:        Mood and Affect: Mood normal.        Behavior: Behavior normal.        Thought Content: Thought content normal.     LABORATORY DATA:   I have reviewed the data as listed.  Results for orders placed or performed in visit on 04/25/23  Lactate dehydrogenase  Result Value Ref Range   LDH 181 98 - 192 U/L  Ferritin  Result Value Ref Range   Ferritin 65 24 - 336 ng/mL  CMP (Cancer Center only)  Result Value Ref Range   Sodium 139 135 - 145 mmol/L   Potassium 4.1 3.5 - 5.1 mmol/L   Chloride 103 98 - 111 mmol/L   CO2 30 22 - 32 mmol/L   Glucose, Bld 110 (H) 70 - 99 mg/dL   BUN 15 6 - 20 mg/dL   Creatinine 8.65 (H) 7.84 - 1.24 mg/dL   Calcium 9.6 8.9 - 69.6 mg/dL   Total Protein 7.6 6.5 - 8.1 g/dL   Albumin 4.2 3.5 - 5.0 g/dL   AST 30 15 - 41 U/L   ALT 33 0 - 44 U/L   Alkaline Phosphatase 90 38 - 126 U/L   Total Bilirubin 0.9 0.0 - 1.2 mg/dL   GFR, Estimated >29 >52 mL/min   Anion gap 6 5 - 15  CBC with Differential (Cancer Center Only)  Result Value Ref Range   WBC Count 5.9 4.0 - 10.5 K/uL   RBC 5.32 4.22 - 5.81 MIL/uL   Hemoglobin 18.3 (H) 13.0 - 17.0 g/dL   HCT 84.1 32.4 - 40.1 %   MCV 95.1 80.0 - 100.0 fL   MCH 34.4 (H) 26.0 - 34.0 pg   MCHC 36.2 (H) 30.0 - 36.0 g/dL   RDW 02.7 25.3 - 66.4 %   Platelet Count 186 150 - 400 K/uL   nRBC 0.0 0.0 - 0.2 %   Neutrophils Relative % 55 %   Neutro Abs 3.3 1.7 - 7.7 K/uL   Lymphocytes Relative 29 %   Lymphs Abs 1.7 0.7 - 4.0 K/uL   Monocytes Relative 12 %   Monocytes Absolute 0.7 0.1 - 1.0 K/uL   Eosinophils  Relative 2 %   Eosinophils Absolute 0.1 0.0 - 0.5 K/uL   Basophils Relative 1 %   Basophils Absolute 0.1 0.0 - 0.1 K/uL   Immature Granulocytes 1 %   Abs Immature Granulocytes 0.03 0.00 - 0.07 K/uL     RADIOGRAPHIC STUDIES:  No recent pertinent imaging available to review.  Orders Placed This Encounter  Procedures   CBC with Differential (Cancer Center Only)    Standing Status:   Future    Number of Occurrences:   1    Expiration Date:   04/24/2024   CMP (Cancer Center only)    Standing Status:   Future    Number of Occurrences:   1    Expiration Date:   04/24/2024   Iron and TIBC    Standing Status:   Future    Number of Occurrences:   1    Expiration Date:   04/24/2024   Ferritin    Standing Status:   Future    Number of Occurrences:   1    Expiration Date:   04/24/2024   Lactate dehydrogenase  Standing Status:   Future    Number of Occurrences:   1    Expiration Date:   04/24/2024   Erythropoietin    Standing Status:   Future    Number of Occurrences:   1    Expiration Date:   04/24/2024   JAK2 V617F rfx CALR/MPL/E12-15    Standing Status:   Future    Number of Occurrences:   1    Expiration Date:   04/24/2024   Vitamin B12    Standing Status:   Future    Number of Occurrences:   1    Expiration Date:   04/24/2024   Folate    Standing Status:   Future    Number of Occurrences:   1    Expiration Date:   04/24/2024    Future Appointments  Date Time Provider Department Center  05/04/2023  2:20 PM Swinyer, Zachary George, NP [facility]  05/09/2023  3:00 PM Azura Tufaro, Archie Patten, MD [facility] None  06/02/2023 10:45 AM Mealor, Roberts Gaudy, MD [facility]  07/24/2023 11:00 AM [facility] PHLEBOTOMIST [facility] None  07/24/2023 11:15 AM Daney Moor, Archie Patten, MD [facility] None     I spent a total of 55 minutes during this encounter with the patient including review of chart and various tests results, discussions about plan of care and coordination of care  plan.   This document was completed utilizing speech recognition software. Grammatical errors, random word insertions, pronoun errors, and incomplete sentences are an occasional consequence of this system due to software limitations, ambient noise, and hardware issues. Any formal questions or concerns about the content, text or information contained within the body of this dictation should be directly addressed to the provider for clarification.

## 2023-04-25 NOTE — Assessment & Plan Note (Signed)
Intermittent macrocytosis, likely alcohol induced.  Will check B12 and folate today.  Will follow-up on the results.

## 2023-04-25 NOTE — Assessment & Plan Note (Addendum)
Elevated hemoglobin (17.9) and hematocrit (52.8) noted in November 2024.   Differential includes primary polycythemia (bone marrow mutation) and secondary polycythemia (e.g., sleep apnea).   Risks include increased blood viscosity, potential for clots, heart issues, or stroke, especially with hematocrit >55 or >45 if JAK2 mutation is present.  Labs today showed persistent mild polycythemia with hemoglobin of 18.3, hematocrit 50.6, MCV 95.  White count and platelet count are normal.  Creatinine 1.3, otherwise unremarkable CMP.  LDH and ferritin are within normal limits.  We will pursue additional workup for polycythemia.  Checked erythropoietin level.  Will also obtain JAK2 mutation testing with reflex analysis to rule out primary myeloproliferative neoplasm.  In his case, most likely etiology for polycythemia is untreated OSA, causing secondary polycythemia.  - Schedule phone call visit in two weeks to discuss results  - Plan for phlebotomy if hematocrit >55 or if JAK2 mutation positive  - Follow-up visit in three months.  Depending on above results, we will reschedule appointment to a sooner date if needed.

## 2023-04-25 NOTE — Assessment & Plan Note (Signed)
Diagnosed one year ago, currently on Eliquis BID. No recent AFib episodes, heart rate regular on examination. - Continue Eliquis and Farxiga as prescribed - Follow-up with electrophysiologist next month

## 2023-04-27 ENCOUNTER — Other Ambulatory Visit: Payer: Self-pay | Admitting: Cardiovascular Disease

## 2023-04-27 ENCOUNTER — Other Ambulatory Visit: Payer: Self-pay | Admitting: Family Medicine

## 2023-04-27 LAB — ERYTHROPOIETIN: Erythropoietin: 9.9 m[IU]/mL (ref 2.6–18.5)

## 2023-04-27 NOTE — Telephone Encounter (Signed)
Prescription refill request for Eliquis received. Indication: AF Last office visit: 12/22/22  Janeece Riggers NP Scr: 1.30 on 04/25/23  Epic Age: 53 Weight: 93kg  Based on above findings Eliquis 5mg  twice daily is the appropriate dose.  Refill approved.

## 2023-05-01 LAB — JAK2 V617F RFX CALR/MPL/E12-15

## 2023-05-01 LAB — CALR +MPL + E12-E15  (REFLEX)

## 2023-05-04 ENCOUNTER — Ambulatory Visit: Payer: 59 | Attending: Nurse Practitioner | Admitting: Nurse Practitioner

## 2023-05-04 ENCOUNTER — Other Ambulatory Visit (HOSPITAL_COMMUNITY): Payer: Self-pay

## 2023-05-04 ENCOUNTER — Telehealth: Payer: Self-pay | Admitting: Pharmacy Technician

## 2023-05-04 ENCOUNTER — Encounter: Payer: Self-pay | Admitting: Nurse Practitioner

## 2023-05-04 VITALS — BP 122/70 | HR 68 | Ht 73.0 in | Wt 210.6 lb

## 2023-05-04 DIAGNOSIS — G4733 Obstructive sleep apnea (adult) (pediatric): Secondary | ICD-10-CM | POA: Diagnosis not present

## 2023-05-04 DIAGNOSIS — I4819 Other persistent atrial fibrillation: Secondary | ICD-10-CM | POA: Diagnosis not present

## 2023-05-04 DIAGNOSIS — I5022 Chronic systolic (congestive) heart failure: Secondary | ICD-10-CM | POA: Diagnosis not present

## 2023-05-04 DIAGNOSIS — D6869 Other thrombophilia: Secondary | ICD-10-CM | POA: Diagnosis not present

## 2023-05-04 MED ORDER — EMPAGLIFLOZIN 10 MG PO TABS: 10.0000 mg | ORAL_TABLET | Freq: Every day | ORAL | Status: DC

## 2023-05-04 MED ORDER — EMPAGLIFLOZIN 10 MG PO TABS: 10.0000 mg | ORAL_TABLET | Freq: Every day | ORAL | 3 refills | Status: AC

## 2023-05-04 MED ORDER — APIXABAN 5 MG PO TABS: 5.0000 mg | ORAL_TABLET | Freq: Two times a day (BID) | ORAL | 0 refills | Status: DC

## 2023-05-04 NOTE — Progress Notes (Signed)
 Cardiology Office Note:  .   Date:  05/04/2023  ID:  Jacob Buchanan, DOB September 23, 1970, MRN [id number] PCP: Jacob Dark, MD  [facility] Providers Cardiologist:  None Electrophysiologist:  Jacob Small, MD    Patient Profile: .      PMH Persistent atrial fibrillation on chronic anticoagulation Failed DCCV 03/2022 S/p ablation 08/2022 HFmrEF OSA Gilbert's disease Hyperlipidemia Coronary calcium score of 0 on CT 08/2022  Referred to [facility] health atrial fibrillation clinic and seen by Jacob Loa, PA on 03/09/2022. Previous heart history of murmur that he was told would be monitored. He had done well since that time without recurrence until he presented to ED 02/27/2022 with congestion and SOB.  He tested positive for RSV and was incidentally found to be in A-fib with RVR.  He was started on diltiazem drip and transferred to [facility] for admission.  Echo showed EF 40 to 45%, suspected related to tachycardia.  Diltiazem was discontinued and metoprolol started for rate control.  He was started on Eliquis for CHA2DS2-VASc score of 1.  EKG revealed A-fib RVR at 124 bpm.  Had unsuccessful cardioversion 03/29/2022.  He was started on amiodarone for bridge to ablation.  Repeat cardioversion 06/07/2022, unsuccessful.  CT 08/2022 revealed calcium score of 0.  He underwent A-fib ablation with Dr. Nelly Buchanan on 08/31/2022.  Last cardiology clinic visit was 12/22/2022 with Jacob Brim, NP.  EKG revealed sinus bradycardia with incomplete RBBB.  Reported no further episodes of known AF.  Amiodarone was stopped.  He reported diagnosis of OSA intolerant to CPAP.  2D echo was ordered and completed 01/18/2023 and revealed mildly reduced LVEF 50 to 55%, indeterminate diastolic parameters, normal RV, no significant valve disease.        History of Present Illness: .   Jacob Buchanan is a pleasant 53 y.o. male who is here today for follow-up of HFmrEF. 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 He has recently recommitted to regular exercise and healthier eating. He has not previously been seen by general cardiology. His heart function has improved since the last checkup, but is still slightly below normal.He has been on  [place] for several months, but due to insurance issues, it is no longer covered. [place] Pepper is the preferred SGLT2i. He also reports a significant increase in the cost of Eliquis on his new insurance.  Advised him to discuss plan for continuing [facility] when he sees Dr. Nelly Buchanan in 1 month.  He is experiencing sleep apnea and is seeking treatment for it; did not tolerate CPAP. He denies chest pain, shortness of breath, lower extremity edema, fatigue, palpitations, melena, presyncope, syncope, orthopnea, and PND.   Discussed the use of AI scribe software for clinical note transcription with the patient, who gave verbal consent to proceed.   ROS: See HPI       Studies Reviewed: .        Risk Assessment/Calculations:    CHA2DS2-VASc Score = 1   This indicates a 0.6% annual risk of stroke. The patient's score is based upon: CHF History: 1 HTN History: 0 Diabetes History: 0 Stroke History: 0 Vascular Disease History: 0 Age Score: 0 Gender Score: 0             Physical Exam:   VS:  BP 122/70 (BP Location: Left Arm)   Pulse 68   Ht 6\' 1"  (1.854 m)   Wt 210 lb 9.6 oz (95.5 kg)   SpO2 95%  BMI 27.79 kg/m    Wt Readings from Last 3 Encounters:  05/04/23 210 lb 9.6 oz (95.5 kg)  04/25/23 214 lb 2 oz (97.1 kg)  01/20/23 202 lb (91.6 kg)    GEN: Well nourished, well developed in no acute distress NECK: No JVD; No carotid bruits CARDIAC: RRR, no murmurs, rubs, gallops RESPIRATORY:  Clear to auscultation without rales, wheezing or rhonchi  ABDOMEN: Soft, non-tender, non-distended EXTREMITIES:  No edema; No deformity     ASSESSMENT AND PLAN: .    Persistent atrial fib s/p ablation: Clinically appears to be in sinus rhythm today, HR  well-controlled.  He is feeling well and does not have any concerning symptoms of A-fib. No bleeding concerns.  Continue Eliquis 5 mg twice daily which is appropriate dose for stroke prevention for CHA2DS2-VASc score of 1.  He is concerned about increased cost of Eliquis on new insurance plan.  He sees Dr. Nelly Buchanan in 1 month and has enough Eliquis to last for the next 2 weeks.  Samples provided to give him enough until the appointment. Advised him to discuss plan for continuing OAC at that appointment.  Continue metoprolol for rate control.  HFmrEF: Initially diagnosed with HFrEF 02/2022 when he had RSV and developed atrial fibrillation. Decreased LV function felt to be 2/2 a fib. He has been on GDMT for CHF including SGLT2i and metoprolol. Improvement in LVEF to 50-55% on echo 01/2023.  Insurance prefers [facility] over [facility], so we will switch him to Jardiance 10 mg daily.  Renal function has been stable on labs completed 04/25/2023.  We will plan for repeat BMET in 1 month.  Continue metoprolol.  OSA: Says he cannot tolerate CPAP, will sleep less time trying to wear it.  He has been seen by Dr. Jenne Buchanan and would like to get the inspire device, however he is getting pushback from insurance. We discussed the importance of managing his sleep apnea in the setting of heart health.        Disposition:6 months with Dr. Bjorn Buchanan (new gen cards)  Signed, Jacob Bridegroom, NP-C

## 2023-05-04 NOTE — Patient Instructions (Signed)
 Medication Instructions:   DISCONTINUE Farxiga   START Jardiance one (1) tablet by mouth ( 10 mg) daily.   *If you need a refill on your cardiac medications before your next appointment, please call your pharmacy*   Lab Work:  None ordered.  If you have labs (blood work) drawn today and your tests are completely normal, you will receive your results only by: MyChart Message (if you have MyChart) OR A paper copy in the mail If you have any lab test that is abnormal or we need to change your treatment, we will call you to review the results.   Testing/Procedures:  None ordered.   Follow-Up: At [facility], you and your health needs are our priority.  As part of our continuing mission to provide you with exceptional heart care, we have created designated Provider Care Teams.  These Care Teams include your primary Cardiologist (physician) and Advanced Practice Providers (APPs -  Physician Assistants and Nurse Practitioners) who all work together to provide you with the care you need, when you need it.  We recommend signing up for the patient portal called "MyChart".  Sign up information is provided on this After Visit Summary.  MyChart is used to connect with patients for Virtual Visits (Telemedicine).  Patients are able to view lab/test results, encounter notes, upcoming appointments, etc.  Non-urgent messages can be sent to your provider as well.   To learn more about what you can do with MyChart, go to [email or web address].    Your next appointment:   6 month(s)  Provider:   Dr.Schumann    Other Instructions  Your physician wants you to follow-up in: 6 months. You will receive a reminder letter in the mail two months in advance. If you don't receive a letter, please call our office to schedule the follow-up appointment.    1st Floor: - Lobby - Registration  - Pharmacy  - Lab - Cafe  2nd Floor: - PV Lab - Diagnostic Testing (echo, CT, nuclear med)  3rd  Floor: - Vacant  4th Floor: - TCTS (cardiothoracic surgery) - AFib Clinic - Structural Heart Clinic - Vascular Surgery  - Vascular Ultrasound  5th Floor: - [facility] (general and EP) - Clinical Pharmacy for coumadin, hypertension, lipid, weight-loss medications, and med management appointments    Valet parking services will be available as well.

## 2023-05-04 NOTE — Telephone Encounter (Signed)
 Pharmacy Patient Advocate Encounter   Received notification from Fax that prior authorization for JARDIANCE is required/requested.   Insurance verification completed.   The patient is insured through [employer] .   Per test claim: PA required; PA submitted to above mentioned insurance via CoverMyMeds Key/confirmation #/EOC [id number] Status is pending

## 2023-05-05 ENCOUNTER — Telehealth: Payer: Self-pay | Admitting: Pharmacy Technician

## 2023-05-05 ENCOUNTER — Encounter: Payer: Self-pay | Admitting: Pharmacy Technician

## 2023-05-05 NOTE — Telephone Encounter (Signed)
 Pharmacy Patient Advocate Encounter  Received notification from [employer] that Prior Authorization for jardiance has been APPROVED from 05/03/23 to 05/02/24. Spoke to pharmacy to process.Copay is $152.58 for 30 days .    PA #/Case ID/Reference #: [id number]

## 2023-05-05 NOTE — Telephone Encounter (Signed)
 I got the patient a coupon and now his copay is now 10.00

## 2023-05-05 NOTE — Telephone Encounter (Signed)
   I got the patient a coupon, gave it to the pharmacy and sent the patient a mychart

## 2023-05-09 ENCOUNTER — Inpatient Hospital Stay: Payer: 59 | Attending: Oncology | Admitting: Oncology

## 2023-05-09 ENCOUNTER — Encounter: Payer: Self-pay | Admitting: Oncology

## 2023-05-09 DIAGNOSIS — D751 Secondary polycythemia: Secondary | ICD-10-CM

## 2023-05-09 NOTE — Assessment & Plan Note (Signed)
 Elevated hemoglobin (17.9) and hematocrit (52.8) noted in November 2024.   On his initial consultation with [place] on 04/25/2023, labs showed persistent mild polycythemia with hemoglobin of 18.3, hematocrit 50.6, MCV 95.  White count and platelet count were normal.  Creatinine 1.3, otherwise unremarkable CMP.  LDH, iron studies, ferritin, vitamin B12 and folate were within normal limits.  Erythropoietin level was normal.  JAK2 mutation analysis with reflex testing for CALR, MPL, exon 12-15 mutations were all negative, thus ruling out primary polycythemia.   In his case, most likely etiology for polycythemia is untreated OSA, causing secondary polycythemia.  Risks include increased blood viscosity, potential for clots, heart issues, or stroke, especially with hematocrit >55.   Plan for phlebotomy if hematocrit >55.  Currently no indication.  We will continue close monitoring.

## 2023-05-09 NOTE — Progress Notes (Signed)
 [facility]  HEMATOLOGY-ONCOLOGY ELECTRONIC VISIT PROGRESS NOTE  PATIENT NAME: Jacob Buchanan   MR#: [id number] DOB: 03-Oct-1970  DATE OF SERVICE: 05/09/2023  Patient Care Team: Ardith Dark, MD as PCP - General (Family Medicine) Mealor, Roberts Gaudy, MD as PCP - Electrophysiology (Cardiology)  I connected with the patient via telephone conference and verified that I am speaking with the correct person using two identifiers. The patient's location is at home and I am providing care from the [facility].  I discussed the limitations, risks, security and privacy concerns of performing an evaluation and management service by e-visits and the availability of in person appointments.  I also discussed with the patient that there may be a patient responsible charge related to this service. The patient expressed understanding and agreed to proceed.   ASSESSMENT & PLAN:   Jacob Buchanan is a 53 y.o. gentleman with past medical history of atrial fibrillation, GERD, Gilbert's syndrome, dyslipidemia, sleep apnea (not on CPAP), was referred to our clinic in February 2025 for evaluation of polycythemia.   Polycythemia Elevated hemoglobin (17.9) and hematocrit (52.8) noted in November 2024.   On his initial consultation with [place] on 04/25/2023, labs showed persistent mild polycythemia with hemoglobin of 18.3, hematocrit 50.6, MCV 95.  White count and platelet count were normal.  Creatinine 1.3, otherwise unremarkable CMP.  LDH, iron studies, ferritin, vitamin B12 and folate were within normal limits.  Erythropoietin level was normal.  JAK2 mutation analysis with reflex testing for CALR, MPL, exon 12-15 mutations were all negative, thus ruling out primary polycythemia.   In his case, most likely etiology for polycythemia is untreated OSA, causing secondary polycythemia.  Risks include increased blood viscosity, potential for clots, heart issues, or stroke, especially with  hematocrit >55.   Plan for phlebotomy if hematocrit >55.  Currently no indication.  We will continue close monitoring.  RTC in May 2025 as scheduled.  I discussed the assessment and treatment plan with the patient. The patient was provided an opportunity to ask questions and all were answered. The patient agreed with the plan and demonstrated an understanding of the instructions. The patient was advised to call back or seek an in-person evaluation if the symptoms worsen or if the condition fails to improve as anticipated.    I spent 11 minutes over the phone with the patient reviewing test results, discuss management and coordination/planning of care.  Meryl Crutch, MD 05/09/2023 3:16 PM [facility] - A DE[facility] OF [facility] [address] [place] [address] Dept: [phone] Dept Fax: [phone]   INTERVAL HISTORY:  Please see above for problem oriented charting.  The purpose of today's discussion is to explain recent lab results and to formulate plan of care.  He reports no new symptoms since last clinic visit.  He feels he is at his baseline.  SUMMARY OF HEMATOLOGY HISTORY:  He has had consistently elevated hemoglobin levels at least since 2023. The patient noticed the high levels and requested the referral. The patient denies any history of smoking or use of testosterone supplements. The patient has sleep apnea but is not using any treatment for it due to discomfort with the CPAP machine and mouth guard. The patient also reports daily alcohol consumption. The patient has not started any new medications recently. The patient denies any symptoms of chest pain, trouble breathing, dizziness, or lightheadedness. The patient's appetite has been stable and he has  been maintaining his weight.  He denies any history of blood clots.     On his initial consultation with [place] on 04/25/2023, labs showed persistent mild  polycythemia with hemoglobin of 18.3, hematocrit 50.6, MCV 95.  White count and platelet count were normal.  Creatinine 1.3, otherwise unremarkable CMP.  LDH, iron studies, ferritin, vitamin B12 and folate were within normal limits.  Erythropoietin level was normal.  JAK2 mutation analysis with reflex testing for CALR, MPL, exon 12-15 mutations were all negative, thus ruling out primary polycythemia.   In his case, most likely etiology for polycythemia is untreated OSA, causing secondary polycythemia.  Risks include increased blood viscosity, potential for clots, heart issues, or stroke, especially with hematocrit >55.   Plan for phlebotomy if hematocrit >55.  Currently no indication.  We will continue close monitoring.  REVIEW OF SYSTEMS:    Review of Systems - Oncology  All other pertinent systems were reviewed with the patient and are negative.  I have reviewed the past medical history, past surgical history, social history and family history with the patient and they are unchanged from previous note.  ALLERGIES:  He is allergic to pollen extract.  MEDICATIONS:  Current Outpatient Medications  Medication Sig Dispense Refill   apixaban (ELIQUIS) 5 MG TABS tablet TAKE 1 TABLET BY MOUTH 2 TIMES A DAY 180 tablet 1   apixaban (ELIQUIS) 5 MG TABS tablet Take 1 tablet (5 mg total) by mouth 2 (two) times daily. 28 tablet 0   empagliflozin (JARDIANCE) 10 MG TABS tablet Take 1 tablet (10 mg total) by mouth daily before breakfast. 90 tablet 3   empagliflozin (JARDIANCE) 10 MG TABS tablet Take 1 tablet (10 mg total) by mouth daily before breakfast.     ferrous sulfate 325 (65 FE) MG tablet Take 325 mg by mouth in the morning.     fluticasone (FLONASE) 50 MCG/ACT nasal spray Place 2 sprays into both nostrils daily as needed for allergies.     metoprolol succinate (TOPROL-XL) 100 MG 24 hr tablet TAKE 1 TABLET BY MOUTH DAILY. TAKE WITH OR IMMEDIATELY FOLLOWING A MEAL 90 tablet 3   Multiple Vitamin  (MULTIVITAMIN) tablet Take 1 tablet by mouth 4 (four) times a week.     olopatadine (PATADAY) 0.1 % ophthalmic solution Place 1 drop into both eyes 2 (two) times daily as needed for allergies.     omeprazole (PRILOSEC) 40 MG capsule TAKE 1 CAPSULE BY MOUTH TWICE A DAY 180 capsule 1   sildenafil (VIAGRA) 100 MG tablet Take 0.5-1 tablets (50-100 mg total) by mouth daily as needed for erectile dysfunction. 90 tablet 0   tadalafil (CIALIS) 20 MG tablet Take 0.5-1 tablets (10-20 mg total) by mouth daily as needed for erectile dysfunction. 90 tablet 0   valACYclovir (VALTREX) 1000 MG tablet TAKE 1 TABLET BY MOUTH DAILY 90 tablet 0   No current facility-administered medications for this visit.    PHYSICAL EXAMINATION:    Onc Performance Status - 05/09/23 1500       ECOG Perf Status   ECOG Perf Status Fully active, able to carry on all pre-disease performance without restriction      KPS SCALE   KPS % SCORE Normal, no compliants, no evidence of disease             LABORATORY DATA:   I have reviewed the data as listed.  Recent Results (from the past 2160 hours)  Vitamin B12     Status: None   Collection  Time: 04/25/23 11:22 AM  Result Value Ref Range   Vitamin B-12 355 180 - 914 pg/mL    Comment: (NOTE) This assay is not validated for testing neonatal or myeloproliferative syndrome specimens for Vitamin B12 levels. Performed at [facility], [address], [place], [place] [address]   JAK2 V617F rfx CALR/MPL/E12-15     Status: None   Collection Time: 04/25/23 11:22 AM  Result Value Ref Range   Specimen Type Comment:     Comment: NOT PROVIDED CORRECTED ON 02/24 AT 1736: PREVIOUSLY REPORTED AS DRAWN BY RN    JAK2 V617F Result Comment     Comment: (NOTE) NEGATIVE The JAK2 V617F mutation is not detected in the provided specimen of this individual. Results should be interpreted in conjunction with clinical and other laboratory findings for the most  accurate interpretation. This test was developed and its performance characteristics determined by [facility]. It has not been cleared or approved by the Food and Drug Administration.    Reflex Comment     Comment: (NOTE) Reflex to CALR Mutation Analysis, JAK2 Exon 12-15 Mutation Analysis, and MPL Mutation Analysis is indicated.    V617F Rfx CALR/MPL/E12-15 Bkgd Comment     Comment: (NOTE)    Molecular testing of blood or bone marrow is useful in the evaluation of suspected myeloproliferative neoplasms (MPN). Mutations in the JAK2, MPL, and CALR genes are present in virtually all MPNs and their presence help distinguish benign reactive processes from clonal neoplasms. These mutations are generally considered mutually exclusive, although concurrent clones have been reported in rare patients. This test will assess for the JAK2V617F (exon 14) mutation first and will reflex to CALR mutation analysis, MPL mutation analysis, and JAK2 exon 12 to 15 mutation analysis if the JAK2V617F mutation is negative.    The JAK2 (Janus kinase 2) gene encodes for a non-receptor protein tyrosine kinase that activates cytokine and growth factor signaling. The V617F (c.1849 G>T) mutation results in constitutive activation of JAK2 and downstream STAT5 and ERK signaling. The V617F mutation is observed in approximately 95% of polycythemia vera (PV), 60% of essential thromboc ythemia (ET) and primary myelofibrosis (PMF). It is also infrequently present (3-5%) in myelodysplastic syndrome, chronic myelomonocytic leukemia, and other atypical chronic myeloid disorders. A small percentage of JAK2 mutation positive patients (3.3%) contain other non-V617F mutations within exons 12 to 15. In particular, mutations in exon 12 of JAK2 have been described in approximately 3% of patients with PV. JAK2 allele burden correlates with clinical phenotype, with low levels of mutant allele characterized by thrombocytosis,  intermediate levels with erythrocytosis, and high mutant allele burden correlating with enhanced myelopoiesis of the BM, leukocytosis, increasing spleen size, and circulating CD34-positive cells.    The CALR (Calreticulin) gene encodes for a multifunctional calcium-binding protein involved in many cellular activities such as growth, proliferation, adhesion, and programmed cell death. Among patients with JAK2 negative MPNs, CALR are found in a pproximately 70% of patients with JAK2-negative essential thrombocythemia (ET) and 60-88% of patients with JAK2-negative primary myelofibrosis(PMF). Only a minority of patients (approximately 8%) with myelodysplasia have mutations in the CALR gene. CALR mutations are rarely detected in patients with de novo acute myeloid leukemia, chronic myelogenous leukemia, lymphoid leukemia, or solid tumors. CALR mutations are not detected in polycythemia and generally appear to be mutually exclusive with JAK2 mutations and MPL mutations. The majority of mutational changes involve a variety of insertion deletion mutations in exon 9 of the calreticulin gene: approximately 53% of all CALR mutations are  a 52 bp deletion (type-1) while the second most prevalent mutation (approximately 32%) contains a 5 bp insertion (type-2). Other mutations (non-type 1 or type 2) are seen in a small minority of cases. CALR mutations in PMF tend to be with a favorable prognosis compared to JAK2 V617F [employer], whereas primary myelofibrosis negative for CALR, JAK2 V617F and MPL mutations (so-called triple negative) is associated with a poor prognosis and shorter survival.    The MPL (myeloproliferative leukemia virus oncogene) gene encodes the thrombopoietin receptor which regulates hematopoiesis and megakaryopoiesis. Activating MPL mutations are associated with a subset of myeloproliferative neoplasms and acute megakaryoblastic leukemia. MPL W515 mutations are present  in approximately 5-8% of patients with primary myelofibrosis (PMF) and 1-4% of patients with essential thrombocythemia (ET). The S505 mutation is detected in patients with hereditary thrombocythemia.    Limitations    This assay has a sensitivity of approximately 1% VAF for JAK2 V617F, 2.5% VAF for other mutations in JAK2 exons 12 to 15, CALR mutations, and MPL mutations. Deletions in JAK2 up to 6 bp and insertions up to 34 bp have been detected in validation studies. Deletions in CALR up to 70 bp and i nsertions up to 12 bp have been detected in validation studies.    Method based next generation sequencing.     Comment: Comment [employer]    References Comment     Comment: (NOTE) Alghasham N, Alnouri Y, Abalkhail H, Clarita Leber. Detection of mutations in JAK2 exons 12-15 by [employer] sequencing. Int J Lab Hematol. 2016 Feb;38(1):34-41. doi: 10.1111/ijlh.16109. Epub 2015 Sep 11. PMID: 60454098. Pura Spice, Unk Lightning, Hasserjian R, Patrica Duel, Borowitz MJ, Leroy Libman MM, Barton CD, [place], Vardiman JW. The 2016 revision to the World Health Organization classification of myeloid neoplasms and acute leukemia. Blood. 2016 May 19;127(20):2391-405. doi: 10.1182/blood-2016-03-643544. Epub 2016 Apr 11. PMID: 11914782. Genevie Ann [facility], Zhang ZJ, Rumson S, Albitar M. Mutation profile of JAK2 transcripts in patients with chronic myeloproliferative neoplasias. J Mol Diagn. 2009 Jan;11(1):49-53.doi: 10.2353/jmoldx.2009.080114. Epub 2008 Dec 12. PMID: 95621308; PMCID: MVH8469629. NCCN Clinical Practice Guidelines in Oncology (NCCN Guidelines) Myeloproliferative Neoplasms Version 3.2022 - October 15, 2020. Swerdlow SH, Programmer, multimedia. WHO classif ication of Tumours of Haematopoietic and Lymphoid Tissues. 4th edn. Jaci Standard, [place]: Geologist, engineering for [employer] on [employer]; 2017. Tefferi A. Primary myelofibrosis: 2021 update on diagnosis, risk-stratification and management. Am J Hematol.  2021 Jan;96(1):145-162. doi: 10.1002/ajh.26050. Epub 2020 Dec 2. PMID: 52841324. Royetta Car, Kralovics R. Genetic basis and molecular pathophysiology of classical myeloproliferative neoplasms. Blood. 2017 Feb 9;129(6):667-679. doi: 10.1182/blood-2016-10-695940. Epub 2016 Dec 27. PMID: 40102725.    Director Review Comment     Comment: (NOTE) Mellody Life, PhD, [facility]    Director, Molecular Oncology    [facility]    Research [place], [place] [address]    [phone] Performed At: [facility] [address] [place], [place] [id number] Maurine Simmering MDPhD [id number] Performed At: [facility] [place], [place] [id number] Maurine Simmering MDPhD [id number]   Erythropoietin     Status: None   Collection Time: 04/25/23 11:22 AM  Result Value Ref Range   Erythropoietin 9.9 2.6 - 18.5 mIU/mL    Comment: (NOTE) Beckman [employer] UniCel DxI 800 Immunoassay System Values obtained with different assay methods or kits cannot be used interchangeably. Results cannot be interpreted as absolute evidence of the presence or absence of malignant disease. Performed At: [facility] [address] [place], [place] [id number] Jolene Schimke MD MV:[id number]   Lactate dehydrogenase     Status: None   Collection Time: 04/25/23 11:22 AM  Result Value Ref Range   LDH 181 98 - 192 U/L    Comment: Performed at [facility], [address] [place], [place] [address]  Iron and TIBC     Status: Abnormal   Collection Time: 04/25/23 11:22 AM  Result Value Ref Range   Iron 167 45 - 182 ug/dL   TIBC 440 102 - 725 ug/dL   Saturation Ratios 40 (H) 17.9 - 39.5 %   UIBC 254 ug/dL    Comment: Performed at [facility], [address] [place], [place] [address]  CMP (Cancer Center only)     Status: Abnormal   Collection Time: 04/25/23 11:22 AM  Result Value Ref Range   Sodium 139 135 - 145 mmol/L   Potassium 4.1 3.5  - 5.1 mmol/L   Chloride 103 98 - 111 mmol/L   CO2 30 22 - 32 mmol/L   Glucose, Bld 110 (H) 70 - 99 mg/dL    Comment: Glucose reference range applies only to samples taken after fasting for at least 8 hours.   BUN 15 6 - 20 mg/dL   Creatinine 0.34 (H) 7.42 - 1.24 mg/dL   Calcium 9.6 8.9 - 59.5 mg/dL   Total Protein 7.6 6.5 - 8.1 g/dL   Albumin 4.2 3.5 - 5.0 g/dL   AST 30 15 - 41 U/L   ALT 33 0 - 44 U/L   Alkaline Phosphatase 90 38 - 126 U/L   Total Bilirubin 0.9 0.0 - 1.2 mg/dL   GFR, Estimated >63 >87 mL/min    Comment: (NOTE) Calculated using the CKD-EPI Creatinine Equation (2021)    Anion gap 6 5 - 15    Comment: Performed at [facility], [address] [place], [place] [address]  CBC with Differential (Cancer Center Only)     Status: Abnormal   Collection Time: 04/25/23 11:22 AM  Result Value Ref Range   WBC Count 5.9 4.0 - 10.5 K/uL   RBC 5.32 4.22 - 5.81 MIL/uL   Hemoglobin 18.3 (H) 13.0 - 17.0 g/dL   HCT 29.5 18.8 - 41.6 %   MCV 95.1 80.0 - 100.0 fL   MCH 34.4 (H) 26.0 - 34.0 pg   MCHC 36.2 (H) 30.0 - 36.0 g/dL   RDW 60.6 30.1 - 60.1 %   Platelet Count 186 150 - 400 K/uL   nRBC 0.0 0.0 - 0.2 %   Neutrophils Relative % 55 %   Neutro Abs 3.3 1.7 - 7.7 K/uL   Lymphocytes Relative 29 %   Lymphs Abs 1.7 0.7 - 4.0 K/uL   Monocytes Relative 12 %   Monocytes Absolute 0.7 0.1 - 1.0 K/uL   Eosinophils Relative 2 %   Eosinophils Absolute 0.1 0.0 - 0.5 K/uL   Basophils Relative 1 %   Basophils Absolute 0.1 0.0 - 0.1 K/uL   Immature Granulocytes 1 %   Abs Immature Granulocytes 0.03 0.00 - 0.07 K/uL    Comment: Performed at [facility], [address], [place], [place] [address]  CALR +MPL + E12-E15 (reflexed)     Status: None   Collection Time: 04/25/23 11:22 AM  Result Value Ref Range   CALR Result Comment     Comment: (NOTE) NEGATIVE No insertions or deletions were detected within the analyzed region of the calreticulin (CALR)  gene.  A negative result does not entirely exclude the possibility of a clonal population carrying CALR gene mutations that are not covered by this assay. Results should be interpreted in conjunction with clinical and laboratory findings for the most accurate interpretation.    MPL Result Comment     Comment: (NOTE) NEGATIVE No MPL mutation was identified in the provided specimen of this individual. Results should be interpreted in conjunction with clinical and other laboratory findings for the most accurate interpretation.    E12-15 Result Comment     Comment: (NOTE) NEGATIVE    JAK2 mutations were not detected in exons 12, 13, 14 and 15. The G to T nucleotide change encoding the V617F mutation was not detected. This result does not rule out the presence of JAK2 mutation at a level below the detection sensitivity of this assay, the presence of other mutations outside the analyzed region of the JAK2 gene, or the presence of a myeloproliferative or other neoplasm. Result must be correlated with other clinical data for the most accurate diagnosis. Performed At: [facility] [address] [place], [place] [id number] Maurine Simmering MDPhD [id number]   Folate     Status: None   Collection Time: 04/25/23 11:24 AM  Result Value Ref Range   Folate 18.3 >5.9 ng/mL    Comment: Performed at [facility], [address], [place], [place] [address]  Ferritin     Status: None   Collection Time: 04/25/23 11:24 AM  Result Value Ref Range   Ferritin 65 24 - 336 ng/mL    Comment: Performed at [facility], [address] [place], [place] [address]     RADIOGRAPHIC STUDIES:  No recent pertinent imaging studies available to review.  Orders Placed This Encounter  Procedures   CBC with Differential (Cancer Center Only)    Standing Status:   Future    Expected Date:   07/24/2023    Expiration Date:   05/08/2024     Future Appointments  Date Time Provider  Department Center  06/02/2023 10:45 AM Mealor, Roberts Gaudy, MD [facility]  07/24/2023 11:00 AM [facility] PHLEBOTOMIST [facility] None  07/24/2023 11:15 AM Keierra Nudo, Archie Patten, MD [facility] None    This document was completed utilizing speech recognition software. Grammatical errors, random word insertions, pronoun errors, and incomplete sentences are an occasional consequence of this system due to software limitations, ambient noise, and hardware issues. Any formal questions or concerns about the content, text or information contained within the body of this dictation should be directly addressed to the provider for clarification.

## 2023-06-02 ENCOUNTER — Encounter: Payer: Self-pay | Admitting: Cardiovascular Disease

## 2023-06-02 ENCOUNTER — Ambulatory Visit: Payer: 59 | Attending: Internal Medicine | Admitting: Cardiovascular Disease

## 2023-06-02 VITALS — BP 124/90 | HR 67 | Ht 73.0 in | Wt 211.8 lb

## 2023-06-02 DIAGNOSIS — I4819 Other persistent atrial fibrillation: Secondary | ICD-10-CM | POA: Diagnosis not present

## 2023-06-02 NOTE — Patient Instructions (Signed)
 Medication Instructions:  Your physician recommends that you continue on your current medications as directed. Please refer to the Current Medication list given to you today. *If you need a refill on your cardiac medications before your next appointment, please call your pharmacy*   Follow-Up: At [facility], you and your health needs are our priority.  As part of our continuing mission to provide you with exceptional heart care, our providers are all part of one team.  This team includes your primary Cardiologist (physician) and Advanced Practice Providers or APPs (Physician Assistants and Nurse Practitioners) who all work together to provide you with the care you need, when you need it.  Your next appointment:   6 month(s)  Provider:   York Pellant, MD  We recommend signing up for the patient portal called "MyChart".  Sign up information is provided on this After Visit Summary.  MyChart is used to connect with patients for Virtual Visits (Telemedicine).  Patients are able to view lab/test results, encounter notes, upcoming appointments, etc.  Non-urgent messages can be sent to your provider as well.   To learn more about what you can do with MyChart, go to [email or web address].        1st Floor: - Lobby - Registration  - Pharmacy  - Lab - Cafe  2nd Floor: - PV Lab - Diagnostic Testing (echo, CT, nuclear med)  3rd Floor: - Vacant  4th Floor: - TCTS (cardiothoracic surgery) - AFib Clinic - Structural Heart Clinic - Vascular Surgery  - Vascular Ultrasound  5th Floor: - HeartCare Cardiology (general and EP) - Clinical Pharmacy for coumadin, hypertension, lipid, weight-loss medications, and med management appointments    Valet parking services will be available as well.

## 2023-06-02 NOTE — Progress Notes (Signed)
 Electrophysiology Office Note:    Date:  06/02/2023   ID:  Jacob Buchanan, DOB November 01, 1970, MRN [id number]  PCP:  Ardith Dark, MD   [facility] Providers Cardiologist:  None Electrophysiologist:  Maurice Small, MD     Referring MD: Ardith Dark, MD   History of Present Illness:    Jacob Buchanan is a 53 y.o. male with a hx listed below, significant for AF, CHFrEF, referred for arrhythmia management.  He was diagnosed with atrial fibrillation at a young age, over 20 years ago.  He had a recurrence in December 2023 when he presented to the ER with respiratory symptoms and tested positive for RSV.  He was placed on a diltiazem drip initially and transitioned to metoprolol.  Echocardiogram showed an EF of 40 to 45%. DCCV on 03/29/22 was unsuccessful.  In follow-up in the A-fib clinic, the patient expressed interest in ablation.  Amiodarone was started as a bridge to the procedure.  He underwent A-fib ablation on August 31, 2022.  The procedure was uncomplicated.  Since, he has not had any symptoms to suggest recurrence of arrhythmia.  Today he denies have any fatigue, palpitations, near syncope, shortness of breath.   EKGs/Labs/Other Studies Reviewed Today:    Cardiac Studies & Procedures   ______________________________________________________________________________________________     ECHOCARDIOGRAM  ECHOCARDIOGRAM COMPLETE 01/18/2023  Narrative ECHOCARDIOGRAM REPORT    Patient Name:   Jacob Buchanan Date of Exam: 01/18/2023 Medical Rec #:  [id number]          Height:       73.0 in Accession #:    [id number]         Weight:       202.6 lb Date of Birth:  07/25/1970          BSA:          2.163 m Patient Age:    52 years           BP:           139/90 mmHg Patient Gender: M                  HR:           63 bpm. Exam Location:  [address]  Procedure: 2D Echo, 3D Echo, Cardiac Doppler, Color Doppler and Strain Analysis  Indications:     I50.20 Heart failure with reduced EF  History:        Patient has prior history of Echocardiogram examinations, most recent 02/28/2022. Arrythmias:Atrial Fibrillation; Signs/Symptoms:Dyspnea, Chest Pain and Edema.  Sonographer:    Clearence Ped RCS Referring Phys: [phone] BRANDI L OLLIS  IMPRESSIONS   1. Left ventricular ejection fraction, by estimation, is 50 to 55%. The left ventricle has low normal function. The left ventricle has no regional wall motion abnormalities. Left ventricular diastolic parameters are indeterminate. The average left ventricular global longitudinal strain is -19.2 %. The global longitudinal strain is normal. 2. Right ventricular systolic function is normal. The right ventricular size is normal. 3. A small pericardial effusion is present. The pericardial effusion is anterior to the right ventricle. 4. The mitral valve is normal in structure. No evidence of mitral valve regurgitation. No evidence of mitral stenosis. 5. The aortic valve is normal in structure. Aortic valve regurgitation is not visualized. No aortic stenosis is present. 6. The inferior vena cava is normal in size with greater than 50% respiratory variability, suggesting right atrial pressure of 3 mmHg.  FINDINGS Left Ventricle: Left ventricular ejection fraction, by estimation, is 50 to 55%. The left ventricle has low normal function. The left ventricle has no regional wall motion abnormalities. The average left ventricular global longitudinal strain is -19.2 %. The global longitudinal strain is normal. The left ventricular internal cavity size was normal in size. There is no left ventricular hypertrophy. Left ventricular diastolic parameters are indeterminate.  Right Ventricle: The right ventricular size is normal. No increase in right ventricular wall thickness. Right ventricular systolic function is normal.  Left Atrium: Left atrial size was normal in size.  Right Atrium: Right atrial size was  normal in size.  Pericardium: A small pericardial effusion is present. The pericardial effusion is anterior to the right ventricle.  Mitral Valve: The mitral valve is normal in structure. No evidence of mitral valve regurgitation. No evidence of mitral valve stenosis.  Tricuspid Valve: The tricuspid valve is normal in structure. Tricuspid valve regurgitation is not demonstrated. No evidence of tricuspid stenosis.  Aortic Valve: The aortic valve is normal in structure. Aortic valve regurgitation is not visualized. No aortic stenosis is present.  Pulmonic Valve: The pulmonic valve was normal in structure. Pulmonic valve regurgitation is not visualized. No evidence of pulmonic stenosis.  Aorta: The aortic root is normal in size and structure.  Venous: The inferior vena cava is normal in size with greater than 50% respiratory variability, suggesting right atrial pressure of 3 mmHg.  IAS/Shunts: No atrial level shunt detected by color flow Doppler.   LEFT VENTRICLE PLAX 2D LVIDd:         4.20 cm   Diastology LVIDs:         2.70 cm   LV e' medial:    8.05 cm/s LV PW:         1.00 cm   LV E/e' medial:  9.0 LV IVS:        0.90 cm   LV e' lateral:   8.59 cm/s LVOT diam:     2.20 cm   LV E/e' lateral: 8.5 LV SV:         71 LV SV Index:   33        2D Longitudinal Strain LVOT Area:     3.80 cm  2D Strain GLS (A2C):   -19.1 % 2D Strain GLS (A3C):   -18.4 % 2D Strain GLS (A4C):   -20.2 % 2D Strain GLS Avg:     -19.2 %  3D Volume EF: 3D EF:        50 % LV EDV:       160 ml LV ESV:       80 ml LV SV:        81 ml  RIGHT VENTRICLE RV Basal diam:  2.90 cm RV S prime:     14.60 cm/s TAPSE (M-mode): 2.5 cm  LEFT ATRIUM             Index        RIGHT ATRIUM           Index LA diam:        3.00 cm 1.39 cm/m   RA Area:     14.10 cm LA Vol (A2C):   36.6 ml 16.92 ml/m  RA Volume:   33.60 ml  15.53 ml/m LA Vol (A4C):   23.9 ml 11.05 ml/m LA Biplane Vol: 32.0 ml 14.79 ml/m AORTIC  VALVE LVOT Vmax:   83.00 cm/s LVOT Vmean:  59.500 cm/s LVOT VTI:  0.187 m  AORTA Ao Root diam: 3.80 cm Ao Asc diam:  3.60 cm  MITRAL VALVE MV Area (PHT):             SHUNTS MV Decel Time:             Systemic VTI:  0.19 m MV E velocity: 72.80 cm/s  Systemic Diam: 2.20 cm MV A velocity: 49.80 cm/s MV E/A ratio:  1.46  Kardie Tobb DO Electronically signed by Thomasene Ripple DO Signature Date/Time: 01/18/2023/1:02:03 PM    Final          ______________________________________________________________________________________________      EKG:  Last EKG results: today - AF with V-rate 89 bpm   Recent Labs: 04/25/2023: ALT 33; BUN 15; Creatinine 1.30; Hemoglobin 18.3; Platelet Count 186; Potassium 4.1; Sodium 139     Physical Exam:    VS:  BP (!) 124/90 (BP Location: Left Arm, Patient Position: Sitting, Cuff Size: Large)   Pulse 67   Ht 6\' 1"  (1.854 m)   Wt 211 lb 12.8 oz (96.1 kg)   SpO2 97%   BMI 27.94 kg/m     Wt Readings from Last 3 Encounters:  06/02/23 211 lb 12.8 oz (96.1 kg)  05/04/23 210 lb 9.6 oz (95.5 kg)  04/25/23 214 lb 2 oz (97.1 kg)     GEN: Well nourished, well developed in no acute distress CARDIAC: iRRR, no murmurs, rubs, gallops RESPIRATORY:  Normal work of breathing MUSCULOSKELETAL: no edema    ASSESSMENT & PLAN:    Persistent atrial fibrillation:  symptomatic with some mild fatigue when in atrial fibrillation Status post RF A-fib ablation on August 31, 2022, maintaining sinus rhythm Continue apixaban 5 mg twice daily  CHF with mid-range EF:  EF 40-45%, likely tachycardia-mediated Continue metoprolol XL 100, Jardiance 10 mg  OSA: has not tolerated CPAP. Is hoping to get a referral for inspire       Medication Adjustments/Labs and Tests Ordered: Current medicines are reviewed at length with the patient today.  Concerns regarding medicines are outlined above.  Orders Placed This Encounter  Procedures   EKG 12-Lead   No  orders of the defined types were placed in this encounter.    Signed, Maurice Small, MD  06/02/2023 11:22 AM    [facility]

## 2023-06-05 ENCOUNTER — Other Ambulatory Visit: Payer: Self-pay

## 2023-06-05 MED ORDER — METOPROLOL SUCCINATE ER 100 MG PO TB24: 100.0000 mg | ORAL_TABLET | Freq: Every day | ORAL | 3 refills | Status: AC

## 2023-06-23 ENCOUNTER — Emergency Department (HOSPITAL_BASED_OUTPATIENT_CLINIC_OR_DEPARTMENT_OTHER)

## 2023-06-23 ENCOUNTER — Telehealth: Payer: Self-pay | Admitting: Cardiovascular Disease

## 2023-06-23 ENCOUNTER — Other Ambulatory Visit: Payer: Self-pay

## 2023-06-23 ENCOUNTER — Emergency Department (HOSPITAL_BASED_OUTPATIENT_CLINIC_OR_DEPARTMENT_OTHER)
Admission: EM | Admit: 2023-06-23 | Discharge: 2023-06-23 | Disposition: A | Discharge status: Home / Self Care | Attending: Emergency Medicine | Admitting: Emergency Medicine

## 2023-06-23 ENCOUNTER — Encounter (HOSPITAL_BASED_OUTPATIENT_CLINIC_OR_DEPARTMENT_OTHER): Payer: Self-pay

## 2023-06-23 DIAGNOSIS — I480 Paroxysmal atrial fibrillation: Secondary | ICD-10-CM | POA: Diagnosis not present

## 2023-06-23 DIAGNOSIS — R002 Palpitations: Secondary | ICD-10-CM | POA: Diagnosis not present

## 2023-06-23 DIAGNOSIS — Z7901 Long term (current) use of anticoagulants: Secondary | ICD-10-CM | POA: Insufficient documentation

## 2023-06-23 LAB — COMPREHENSIVE METABOLIC PANEL WITH GFR
ALT: 27 U/L (ref 0–44)
AST: 29 U/L (ref 15–41)
Albumin: 4.1 g/dL (ref 3.5–5.0)
Alkaline Phosphatase: 93 U/L (ref 38–126)
Anion gap: 7 (ref 5–15)
BUN: 18 mg/dL (ref 6–20)
CO2: 27 mmol/L (ref 22–32)
Calcium: 9.2 mg/dL (ref 8.9–10.3)
Chloride: 105 mmol/L (ref 98–111)
Creatinine, Ser: 1.26 mg/dL — ABNORMAL HIGH (ref 0.61–1.24)
GFR, Estimated: >60 mL/min (ref >60)
Glucose, Bld: 117 mg/dL — ABNORMAL HIGH (ref 70–99)
Potassium: 3.9 mmol/L (ref 3.5–5.1)
Sodium: 139 mmol/L (ref 135–145)
Total Bilirubin: 0.5 mg/dL (ref 0.0–1.2)
Total Protein: 6.8 g/dL (ref 6.5–8.1)

## 2023-06-23 LAB — CBC WITH DIFFERENTIAL/PLATELET
Abs Immature Granulocytes: 0.03 10*3/uL (ref 0.00–0.07)
Basophils Absolute: 0 10*3/uL (ref 0.0–0.1)
Basophils Relative: 1 %
Eosinophils Absolute: 0.1 10*3/uL (ref 0.0–0.5)
Eosinophils Relative: 2 %
HCT: 48.8 % (ref 39.0–52.0)
Hemoglobin: 17.7 g/dL — ABNORMAL HIGH (ref 13.0–17.0)
Immature Granulocytes: 0 %
Lymphocytes Relative: 28 %
Lymphs Abs: 1.9 10*3/uL (ref 0.7–4.0)
MCH: 34 pg (ref 26.0–34.0)
MCHC: 36.3 g/dL — ABNORMAL HIGH (ref 30.0–36.0)
MCV: 93.8 fL (ref 80.0–100.0)
Monocytes Absolute: 0.7 10*3/uL (ref 0.1–1.0)
Monocytes Relative: 11 %
Neutro Abs: 3.9 10*3/uL (ref 1.7–7.7)
Neutrophils Relative %: 58 %
Platelets: 196 10*3/uL (ref 150–400)
RBC: 5.2 MIL/uL (ref 4.22–5.81)
RDW: 11.9 % (ref 11.5–15.5)
WBC: 6.7 10*3/uL (ref 4.0–10.5)
nRBC: 0 % (ref 0.0–0.2)

## 2023-06-23 LAB — MAGNESIUM: Magnesium: 2.2 mg/dL (ref 1.7–2.4)

## 2023-06-23 MED ORDER — PROPOFOL 10 MG/ML IV BOLUS: 0.5000 mg/kg | Freq: Once | INTRAVENOUS | Status: AC

## 2023-06-23 MED ORDER — PROPOFOL 10 MG/ML IV BOLUS
INTRAVENOUS | Status: AC
  Administered 2023-06-23: 50 mg via INTRAVENOUS
  Filled 2023-06-23: qty 20

## 2023-06-23 MED ORDER — METOPROLOL TARTRATE 5 MG/5ML IV SOLN
5.0000 mg | Freq: Once | INTRAVENOUS | Status: AC
  Administered 2023-06-23: 5 mg via INTRAVENOUS
  Filled 2023-06-23: qty 5

## 2023-06-23 MED ORDER — METOPROLOL TARTRATE 5 MG/5ML IV SOLN
INTRAVENOUS | Status: AC
  Administered 2023-06-23: 2.5 mg via INTRAVENOUS
  Filled 2023-06-23: qty 5

## 2023-06-23 NOTE — Sedation Documentation (Signed)
 Zoll set at 120 joules and on sync mode

## 2023-06-23 NOTE — ED Triage Notes (Signed)
 Pt states "my afib kicked in about 3-4hrs ago. Took extra 1/2 dose metoprolol , advises he took 1 amio. States symptoms of SHOB, dizziness have resolved.  R sclera presents w subconjunctival hemorrhage, advises "vigorous sneezing" caused it.

## 2023-06-23 NOTE — ED Provider Notes (Addendum)
 [place] EMERGENCY DEPARTMENT AT [facility] Provider Note   CSN: [id number] Arrival date & time: 06/23/23  1649     History  Chief Complaint  Patient presents with   Atrial Fibrillation    Jacob Buchanan is a 53 y.o. male.  Patient is a 53 year old male with a history of hyperlipidemia, anemia, paroxysmal atrial fibrillation status post ablation last July who is still on metoprolol  and Eliquis  who is presenting today due to going into A-fib this afternoon.  He reports he has been struggling with allergies and has been coughing and sneezing quite a bit but today had a big sneeze and felt like his heart started racing and he felt a little off and a little bit short of breath.  He reports now he feels normal.  He did try to take 1 amiodarone  which he is not consistently on anymore and then took half of one of his metoprolol  but reports did not fix his symptoms.  He otherwise has been feeling fine denies any shortness of breath or chest pain.  No syncope.  He has not missed any doses of his Eliquis  in the last month.  The history is provided by the patient.  Atrial Fibrillation       Home Medications Prior to Admission medications   Medication Sig Start Date End Date Taking? Authorizing Provider  apixaban  (ELIQUIS ) 5 MG TABS tablet TAKE 1 TABLET BY MOUTH 2 TIMES A DAY 04/27/23   Mealor, Augustus E, MD  apixaban  (ELIQUIS ) 5 MG TABS tablet Take 1 tablet (5 mg total) by mouth 2 (two) times daily. 05/04/23   Swinyer, Leilani Punter, NP  empagliflozin  (JARDIANCE ) 10 MG TABS tablet Take 1 tablet (10 mg total) by mouth daily before breakfast. 05/04/23   Swinyer, Leilani Punter, NP  empagliflozin  (JARDIANCE ) 10 MG TABS tablet Take 1 tablet (10 mg total) by mouth daily before breakfast. 05/04/23   Swinyer, Leilani Punter, NP  ferrous sulfate 325 (65 FE) MG tablet Take 325 mg by mouth in the morning.    [provider]  fluticasone  (FLONASE) 50 MCG/ACT nasal spray Place 2 sprays into  both nostrils daily as needed for allergies.    [provider]  metoprolol  succinate (TOPROL -XL) 100 MG 24 hr tablet Take 1 tablet (100 mg total) by mouth daily. TAKE WITH OR IMMEDIATELY FOLLOWING A MEAL. 06/05/23   Mealor, Augustus E, MD  Multiple Vitamin (MULTIVITAMIN) tablet Take 1 tablet by mouth 4 (four) times a week.    [provider]  olopatadine (PATADAY) 0.1 % ophthalmic solution Place 1 drop into both eyes 2 (two) times daily as needed for allergies.    [provider]  omeprazole  (PRILOSEC) 40 MG capsule TAKE 1 CAPSULE BY MOUTH TWICE A DAY 04/27/23   Rodney Clamp, MD  sildenafil  (VIAGRA ) 100 MG tablet Take 0.5-1 tablets (50-100 mg total) by mouth daily as needed for erectile dysfunction. 07/05/22   Rodney Clamp, MD  tadalafil  (CIALIS ) 20 MG tablet Take 0.5-1 tablets (10-20 mg total) by mouth daily as needed for erectile dysfunction. 07/05/22   Rodney Clamp, MD  valACYclovir  (VALTREX ) 1000 MG tablet TAKE 1 TABLET BY MOUTH DAILY 02/22/23   Rodney Clamp, MD      Allergies    Pollen extract    Review of Systems   Review of Systems  Physical Exam Updated Vital Signs BP (!) 122/94   Pulse 63   Temp 98.3 F (36.8 C) (Oral)   Resp 16  Wt 96.3 kg   SpO2 98%   BMI 28.00 kg/m  Physical Exam Vitals and nursing note reviewed.  Constitutional:      General: He is not in acute distress.    Appearance: He is well-developed.  HENT:     Head: Normocephalic and atraumatic.  Eyes:     Conjunctiva/sclera: Conjunctivae normal.     Pupils: Pupils are equal, round, and reactive to light.  Cardiovascular:     Rate and Rhythm: Tachycardia present. Rhythm irregularly irregular.     Heart sounds: No murmur heard. Pulmonary:     Effort: Pulmonary effort is normal. No respiratory distress.     Breath sounds: Normal breath sounds. No wheezing or rales.  Abdominal:     General: There is no distension.     Palpations: Abdomen is soft.     Tenderness:  There is no abdominal tenderness. There is no guarding or rebound.  Musculoskeletal:        General: No tenderness. Normal range of motion.     Cervical back: Normal range of motion and neck supple.  Skin:    General: Skin is warm and dry.     Findings: No erythema or rash.  Neurological:     Mental Status: He is alert and oriented to person, place, and time.  Psychiatric:        Behavior: Behavior normal.     ED Results / Procedures / Treatments   Labs (all labs ordered are listed, but only abnormal results are displayed) Labs Reviewed  CBC WITH DIFFERENTIAL/PLATELET - Abnormal; Notable for the following components:      Result Value   Hemoglobin 17.7 (*)    MCHC 36.3 (*)    All other components within normal limits  COMPREHENSIVE METABOLIC PANEL WITH GFR - Abnormal; Notable for the following components:   Glucose, Bld 117 (*)    Creatinine, Ser 1.26 (*)    All other components within normal limits  MAGNESIUM     EKG EKG Interpretation Date/Time:  Friday June 23 2023 16:53:06 EDT Ventricular Rate:  140 PR Interval:    QRS Duration:  94 QT Interval:  318 QTC Calculation: 485 R Axis:   40  Text Interpretation: Atrial fibrillation with rapid ventricular response with premature ventricular or aberrantly conducted complexes Incomplete right bundle branch block ST & T wave abnormality, consider anterior ischemia Abnormal ECG When compared with ECG of 02-Jun-2023 11:10, Atrial fibrillation has replaced Sinus rhythm Vent. rate has increased BY  73 BPM Non-specific change in ST segment in Anterior leads T wave inversion no longer evident in Inferior leads T wave inversion no longer evident in Lateral leads previous tracing showed sinus rhythm Confirmed by Florette Hurry [phone]) on 06/24/2023 3:42:17 PM  Radiology No results found.  Procedures .Sedation  Date/Time: 06/23/2023 7:25 PM  Performed by: Almond Army, MD Authorized by: Almond Army, MD   Consent:     Consent obtained:  Verbal   Consent given by:  Patient   Risks discussed:  Allergic reaction, dysrhythmia, inadequate sedation, nausea, prolonged hypoxia resulting in organ damage, prolonged sedation necessitating reversal, respiratory compromise necessitating ventilatory assistance and intubation and vomiting   Alternatives discussed:  Analgesia without sedation, anxiolysis and regional anesthesia Universal protocol:    Procedure explained and questions answered to patient or proxy's satisfaction: yes     Relevant documents present and verified: yes     Test results available: yes     Imaging studies available: yes  Required blood products, implants, devices, and special equipment available: yes     Site/side marked: yes     Immediately prior to procedure, a time out was called: yes     Patient identity confirmed:  Verbally with patient Indications:    Procedure performed:  Cardioversion   Procedure necessitating sedation performed by:  Physician performing sedation Pre-sedation assessment:    Time since last food or drink:  More than 6 hours   ASA classification: class 2 - patient with mild systemic disease     Mouth opening:  3 or more finger widths   Thyromental distance:  4 finger widths   Mallampati score:  I - soft palate, uvula, fauces, pillars visible   Neck mobility: normal     Pre-sedation assessments completed and reviewed: airway patency, cardiovascular function, hydration status, mental status, nausea/vomiting, pain level, respiratory function and temperature   A pre-sedation assessment was completed prior to the start of the procedure Immediate pre-procedure details:    Reassessment: Patient reassessed immediately prior to procedure     Reviewed: vital signs, relevant labs/tests and NPO status     Verified: bag valve mask available, emergency equipment available, intubation equipment available, IV patency confirmed, oxygen available and suction available   Procedure  details (see MAR for exact dosages):    Preoxygenation:  Nasal cannula   Sedation:  Propofol    Intended level of sedation: deep   Intra-procedure monitoring:  Blood pressure monitoring, cardiac monitor, continuous pulse oximetry, frequent LOC assessments, frequent vital sign checks and continuous capnometry   Intra-procedure events: none     Total Provider sedation time (minutes):  15 Post-procedure details:   A post-sedation assessment was completed following the completion of the procedure.   Attendance: Constant attendance by certified staff until patient recovered     Recovery: Patient returned to pre-procedure baseline     Post-sedation assessments completed and reviewed: airway patency, cardiovascular function, hydration status, mental status, nausea/vomiting, pain level, respiratory function and temperature     Patient is stable for discharge or admission: yes     Procedure completion:  Tolerated well, no immediate complications     Medications Ordered in ED Medications  metoprolol  tartrate (LOPRESSOR ) injection 5 mg (2.5 mg Intravenous Given 06/23/23 1838)  propofol  (DIPRIVAN ) 10 mg/mL bolus/IV push 48.2 mg (50 mg Intravenous Given 06/23/23 1834)    ED Course/ Medical Decision Making/ A&P                                 Medical Decision Making Amount and/or Complexity of Data Reviewed Labs: ordered. Decision-making details documented in ED Course. Radiology: ordered and independent interpretation performed. Decision-making details documented in ED Course. ECG/medicine tests: ordered and independent interpretation performed. Decision-making details documented in ED Course.  Risk Prescription drug management.   Pt with multiple medical problems and comorbidities and presenting today with a complaint that caries a high risk for morbidity and mortality.  Patient is here today with A-fib RVR with a history of STEMI status post ablation and has had cardioversion in the past.  He  is chronically anticoagulated.  Reports symptoms started this afternoon.  He denies other symptoms at this time.  No chest pain or shortness of breath.  Blood pressure has been stable.  He is well-appearing on exam.  Labs pending.  I independently interpreted patient's EKG and labs.  EKG with atrial fibrillation with RVR and patient was given 5  mg of IV Lopressor .  CBC, CMP and magnesium  all without acute findings.  I have independently visualized and interpreted pt's images today.  Chest x-ray without acute findings today.  On repeat evaluation patient is still in atrial fibrillation.  At this time feel that patient would be a good candidate for cardioversion.  Discussed this with he and his wife and they consent for the procedure. Patient was given propofol  and then he spontaneously converted before cardioversion.  He had some stuttering A-fib but has remained in sinus rhythm now for over an hour.  He was given a small dose of Lopressor .  At this time patient appears stable for discharge.  He remains asymptomatic at this time.  Will continue his current medications.  CRITICAL CARE Performed by: Shane Melby Total critical care time: 30 minutes Critical care time was exclusive of separately billable procedures and treating other patients. Critical care was necessary to treat or prevent imminent or life-threatening deterioration. Critical care was time spent personally by me on the following activities: development of treatment plan with patient and/or surrogate as well as nursing, discussions with consultants, evaluation of patient's response to treatment, examination of patient, obtaining history from patient or surrogate, ordering and performing treatments and interventions, ordering and review of laboratory studies, ordering and review of radiographic studies, pulse oximetry and re-evaluation of patient's condition.           Final Clinical Impression(s) / ED Diagnoses Final diagnoses:   Paroxysmal A-fib [facility])    Rx / DC Orders ED Discharge Orders          Ordered    Amb Referral to AFIB Clinic        06/23/23 1926              Almond Army, MD 06/23/23 Magn[facility] Schroeder    Almond Army, MD 07/09/23 2300

## 2023-06-23 NOTE — Telephone Encounter (Signed)
.  Patient c/o Palpitations:  STAT if patient reporting lightheadedness, shortness of breath, or chest pain  How long have you had palpitations/irregular HR/ Afib? Are you having the symptoms now? Heart is out of rhythm, been like this for the past 3 hours  Are you currently experiencing lightheadedness, SOB or CP?a little shortness of breath   Do you have a history of afib (atrial fibrillation) or irregular heart rhythm?   Have you checked your BP or HR? (document readings if available): no  Are you experiencing any other symptoms? a little dizzy

## 2023-06-23 NOTE — Sedation Documentation (Signed)
 Unable to rate pain score during procedure

## 2023-06-23 NOTE — ED Notes (Signed)
 Pt significant other Lovena Rubinstein is taking patient home. Reviewed d/c instructions, medications, and follow-up care with pt and family. Pt verbalized understanding and had no further questions at time of d/c. Pt ambulatory, CA&Ox4, and in NAD at time of d/c.

## 2023-06-23 NOTE — Discharge Instructions (Addendum)
 All the blood test today look normal.  Continue your current medications at this time.  If you feel like you go back out of rhythm into A-fib again over the weekend and an additional metoprolol  does not work for you return to the emergency room.

## 2023-06-23 NOTE — Sedation Documentation (Signed)
 Patient self-converted to sinus rhythm with no shock delivered. EDP advising no shock at this time. Pt in sinus rhythm.

## 2023-06-23 NOTE — Sedation Documentation (Signed)
 EDP ordering to continue monitoring cardiac rhythm for any changes at this time.

## 2023-06-23 NOTE — ED Notes (Signed)
 Respiratory Therapist at [facility]  in room number __14__ during procedure.  Suction with [place] at [facility] set up and ready to use. Ambu bag at [facility] and ready to use.  Patient placed on ETCO2 Nasal Cannula at _2___ LPM.  Vitals at conclusion of procedure:  ETCO2 _38___ mmHg HR 69 RR 16 SPO2 96%  Patient awake and able to verbalize name. Physician held off on Cardioversion at this time due to patient heart rhythm back in Sinus after setting patient up for Cardioversion

## 2023-06-23 NOTE — Telephone Encounter (Signed)
 Call from [facility] transferred to triage.  Patient reports his heart is out of rhythm and has been this way for the past 3 hours. He reports feeling occasional SOB and dizziness.  Patient reports HR 161 when episode began, now 141. He reports he is no longer taking amiodarone  but did take one 200 mg tablet today. Per protocol advised patient he could take 1/2 dose of his metoprolol  and see if HR/symptoms improve in 30 to 60 minutes. If HR remains above 120, or he continues to have SOB/dizziness at any HR advised to have someone drive him to ED or call EMS.  Patient verbalized understanding of the above.

## 2023-06-23 NOTE — ED Notes (Signed)
 EDP W. Plunkett confirms verbal consent for the indicated procedure.

## 2023-06-23 NOTE — Sedation Documentation (Signed)
 EDP verbal order to give metoprolol  2.5 mg. Metoprolol  2.5 mg administered at this time per EDP verbal order.

## 2023-06-28 DIAGNOSIS — M545 Low back pain, unspecified: Secondary | ICD-10-CM | POA: Diagnosis not present

## 2023-06-28 DIAGNOSIS — M9902 Segmental and somatic dysfunction of thoracic region: Secondary | ICD-10-CM | POA: Diagnosis not present

## 2023-06-28 DIAGNOSIS — M9901 Segmental and somatic dysfunction of cervical region: Secondary | ICD-10-CM | POA: Diagnosis not present

## 2023-06-28 DIAGNOSIS — M25652 Stiffness of left hip, not elsewhere classified: Secondary | ICD-10-CM | POA: Diagnosis not present

## 2023-06-28 DIAGNOSIS — M25651 Stiffness of right hip, not elsewhere classified: Secondary | ICD-10-CM | POA: Diagnosis not present

## 2023-06-28 DIAGNOSIS — M7918 Myalgia, other site: Secondary | ICD-10-CM | POA: Diagnosis not present

## 2023-06-28 DIAGNOSIS — M546 Pain in thoracic spine: Secondary | ICD-10-CM | POA: Diagnosis not present

## 2023-06-28 DIAGNOSIS — M9905 Segmental and somatic dysfunction of pelvic region: Secondary | ICD-10-CM | POA: Diagnosis not present

## 2023-06-28 DIAGNOSIS — M9904 Segmental and somatic dysfunction of sacral region: Secondary | ICD-10-CM | POA: Diagnosis not present

## 2023-06-28 DIAGNOSIS — M542 Cervicalgia: Secondary | ICD-10-CM | POA: Diagnosis not present

## 2023-06-28 DIAGNOSIS — M9903 Segmental and somatic dysfunction of lumbar region: Secondary | ICD-10-CM | POA: Diagnosis not present

## 2023-06-28 DIAGNOSIS — M9906 Segmental and somatic dysfunction of lower extremity: Secondary | ICD-10-CM | POA: Diagnosis not present

## 2023-06-28 NOTE — Telephone Encounter (Signed)
 Spoke with patient, after converting back into NSR while in ED he has maintained NSR since. No concerns at this time

## 2023-06-30 ENCOUNTER — Telehealth: Payer: Self-pay

## 2023-06-30 NOTE — Telephone Encounter (Signed)
   Pre-operative Risk Assessment    Patient Name: Jacob Buchanan  DOB: 1971-03-01 MRN: [id number]   Date of last office visit: 06/02/23 Date of next office visit: Not scheduled   Request for Surgical Clearance    Procedure:   Inspire Implant  Date of Surgery:  Clearance 07/18/23                                Surgeon:  Not given Surgeon's Group or Practice Name:  [facility] [place], [place] Phone number:  [phone] Fax number:  [phone]   Type of Clearance Requested:   - Medical  - Pharmacy:  Hold Apixaban  (Eliquis )     Type of Anesthesia:  Not Indicated   Additional requests/questions:    Gardiner Jumper   06/30/2023, 4:58 PM

## 2023-07-03 NOTE — Telephone Encounter (Signed)
     Primary Cardiologist: Dr. Alda Amas  Chart reviewed as part of pre-operative protocol coverage. Given past medical history and time since last visit, based on ACC/AHA guidelines, Jacob Buchanan would be at acceptable risk for the planned procedure without further cardiovascular testing.   Patient with diagnosis of A Fib on Eliquis  for anticoagulation.     Procedure: Inspire Implant Date of procedure: 07/18/23     CHA2DS2-VASc Score = 1  This indicates a 0.6% annual risk of stroke. The patient's score is based upon: CHF History: 1 HTN History: 0 Diabetes History: 0 Stroke History: 0 Vascular Disease History: 0 Age Score: 0 Gender Score: 0        CrCl 90 ml/min Platelet count 196K     Per office protocol, patient can hold Eliquis  for 2 days prior to procedure.   I will route this recommendation to the requesting party via Epic fax function and remove from pre-op pool.  Please call with questions.  Chet Cota. Awad Gladd NP-C     07/03/2023, 11:11 AM [facility] [address] Office [phone] Fax [phone]

## 2023-07-03 NOTE — Telephone Encounter (Signed)
 Patient with diagnosis of A Fib on Eliquis  for anticoagulation.    Procedure: Inspire Implant Date of procedure: 07/18/23   CHA2DS2-VASc Score = 1  This indicates a 0.6% annual risk of stroke. The patient's score is based upon: CHF History: 1 HTN History: 0 Diabetes History: 0 Stroke History: 0 Vascular Disease History: 0 Age Score: 0 Gender Score: 0       CrCl 90 ml/min Platelet count 196K   Per office protocol, patient can hold Eliquis  for 2 days prior to procedure.   .  **This guidance is not considered finalized until pre-operative APP has relayed final recommendations.**

## 2023-07-04 ENCOUNTER — Telehealth: Payer: Self-pay | Admitting: Cardiovascular Disease

## 2023-07-04 NOTE — Telephone Encounter (Signed)
 Pt calling to make provider aware that he is having an Inspire put in on 5/26 for Sleep Apnea. Pt requesting c/b to be sure that he is cleared to have this done. Please advise

## 2023-07-14 NOTE — Telephone Encounter (Signed)
 Spoke with patient, just wanted to ensure our office was aware of him getting Inspire device with Dr Tellis Feathers on 07/31/23. Patient states risk stratification has already been completed. No further questions or concerns at this time.

## 2023-07-24 ENCOUNTER — Inpatient Hospital Stay (HOSPITAL_BASED_OUTPATIENT_CLINIC_OR_DEPARTMENT_OTHER): Payer: 59 | Admitting: Oncology

## 2023-07-24 ENCOUNTER — Encounter: Payer: Self-pay | Admitting: Oncology

## 2023-07-24 ENCOUNTER — Inpatient Hospital Stay: Payer: 59 | Attending: Oncology

## 2023-07-24 VITALS — BP 132/86 | HR 63 | Temp 98.6°F | Resp 14 | Ht 73.0 in | Wt 210.5 lb

## 2023-07-24 DIAGNOSIS — G4730 Sleep apnea, unspecified: Secondary | ICD-10-CM | POA: Insufficient documentation

## 2023-07-24 DIAGNOSIS — D751 Secondary polycythemia: Secondary | ICD-10-CM

## 2023-07-24 DIAGNOSIS — Z7901 Long term (current) use of anticoagulants: Secondary | ICD-10-CM | POA: Diagnosis not present

## 2023-07-24 DIAGNOSIS — E785 Hyperlipidemia, unspecified: Secondary | ICD-10-CM | POA: Insufficient documentation

## 2023-07-24 DIAGNOSIS — Z0000 Encounter for general adult medical examination without abnormal findings: Secondary | ICD-10-CM

## 2023-07-24 DIAGNOSIS — D7589 Other specified diseases of blood and blood-forming organs: Secondary | ICD-10-CM | POA: Insufficient documentation

## 2023-07-24 DIAGNOSIS — K219 Gastro-esophageal reflux disease without esophagitis: Secondary | ICD-10-CM | POA: Insufficient documentation

## 2023-07-24 DIAGNOSIS — Z79899 Other long term (current) drug therapy: Secondary | ICD-10-CM | POA: Diagnosis not present

## 2023-07-24 DIAGNOSIS — I4891 Unspecified atrial fibrillation: Secondary | ICD-10-CM | POA: Diagnosis not present

## 2023-07-24 LAB — CBC WITH DIFFERENTIAL (CANCER CENTER ONLY)
Abs Immature Granulocytes: 0.05 10*3/uL (ref 0.00–0.07)
Basophils Absolute: 0.1 10*3/uL (ref 0.0–0.1)
Basophils Relative: 1 %
Eosinophils Absolute: 0.2 10*3/uL (ref 0.0–0.5)
Eosinophils Relative: 3 %
HCT: 46.7 % (ref 39.0–52.0)
Hemoglobin: 16.9 g/dL (ref 13.0–17.0)
Immature Granulocytes: 1 %
Lymphocytes Relative: 26 %
Lymphs Abs: 1.6 10*3/uL (ref 0.7–4.0)
MCH: 34.1 pg — ABNORMAL HIGH (ref 26.0–34.0)
MCHC: 36.2 g/dL — ABNORMAL HIGH (ref 30.0–36.0)
MCV: 94.3 fL (ref 80.0–100.0)
Monocytes Absolute: 0.8 10*3/uL (ref 0.1–1.0)
Monocytes Relative: 14 %
Neutro Abs: 3.4 10*3/uL (ref 1.7–7.7)
Neutrophils Relative %: 55 %
Platelet Count: 198 10*3/uL (ref 150–400)
RBC: 4.95 MIL/uL (ref 4.22–5.81)
RDW: 11.9 % (ref 11.5–15.5)
WBC Count: 6.1 10*3/uL (ref 4.0–10.5)
nRBC: 0 % (ref 0.0–0.2)

## 2023-07-24 NOTE — Progress Notes (Signed)
 [facility]  HEMATOLOGY CLINIC PROGRESS NOTE  PATIENT NAME: Jacob Buchanan   MR#: [id number] DOB: Feb 12, 1971  Patient Care Team: Rodney Clamp, MD as PCP - General (Family Medicine) Mealor, Donnamae Gaba, MD as PCP - Electrophysiology (Cardiology)  Date of visit: 07/24/2023   ASSESSMENT & PLAN:   Jacob Buchanan is a 53 y.o.  gentleman with past medical history of atrial fibrillation, GERD, Gilbert's syndrome, dyslipidemia, sleep apnea (not on CPAP), was referred to our clinic in February 2025 for evaluation of polycythemia.  Likely secondary polycythemia from OSA.  Secondary polycythemia Elevated hemoglobin (17.9) and hematocrit (52.8) noted in November 2024.   On his initial consultation with us  on 04/25/2023, labs showed persistent mild polycythemia with hemoglobin of 18.3, hematocrit 50.6, MCV 95.  White count and platelet count were normal.  Creatinine 1.3, otherwise unremarkable CMP.  LDH, iron studies, ferritin, vitamin B12 and folate were within normal limits.  Erythropoietin  level was normal.  JAK2 mutation analysis with reflex testing for CALR, MPL, exon 12-15 mutations were all negative, thus ruling out primary polycythemia.   In his case, most likely etiology for polycythemia is untreated OSA, causing secondary polycythemia.  Risks include increased blood viscosity, potential for clots, heart issues, or stroke, especially with hematocrit >55.   Labs today showed hemoglobin of 16.9, hematocrit 46.7.  MCV is now normal at 94.3.  White count and platelet count are normal.  Plan for phlebotomy if hematocrit >55.  Currently no indication.   He was given the option of continued follow-up with us  versus continued surveillance with PCP and patient opted to follow-up with his PCP.  Please reconsult us  again in case of hematocrit consistently above 55.  Thank you for giving us  an opportunity to participate in his care.  Macrocytosis Intermittent  macrocytosis, likely alcohol induced.  Vitamin B12 and folic acid  were within normal limits in February 2025.  Currently MCV is normal.  Healthcare maintenance Up to date on colonoscopy (2021) and endoscopy.   I spent a total of 20 minutes during this encounter with the patient including review of chart and various tests results, discussions about plan of care and coordination of care plan.  I reviewed lab results and outside records for this visit and discussed relevant results with the patient. Diagnosis, plan of care and treatment options were also discussed in detail with the patient. Opportunity provided to ask questions and answers provided to his apparent satisfaction. Provided instructions to call our clinic with any problems, questions or concerns. I recommended to continue follow-up with PCP and sub-specialists. He verbalized understanding and agreed with the plan. No barriers to learning was detected.  Arlo Berber, MD  07/24/2023 11:55 AM  [facility] - A DEPT OF [facility] [address] [place] [address] Dept: [phone] Dept Fax: [phone]   CHIEF COMPLAINT/ REASON FOR VISIT:  Secondary polycythemia, likely related to OSA.  JAK2 mutation analysis with reflex testing for CALR, MPL, exon 12-15 mutations were all negative in February 2025.  INTERVAL HISTORY:  Discussed the use of AI scribe software for clinical note transcription with the patient, who gave verbal consent to proceed.  History of Present Illness Jacob Buchanan is a 53 year old male who presents for follow-up of elevated red blood cell count.  Over the past three months, his hematocrit has decreased from 50.6% in February to 46.7% today, and his hemoglobin is currently 16.9  g/dL. No major health concerns have been reported during this period.  He briefly tried CPAP in the past but is not currently using it. He is scheduled  to undergo an Inspire procedure at the end of June.  His previous workup included mutation testing, which returned negative results, and evaluations of B12 and folic acid  levels, which were normal. His red blood cell size, previously noted to be large, is now normal.  He consumes alcohol occasionally and does not smoke.  No changes in his health status have been noted.    SUMMARY OF HEMATOLOGIC HISTORY:  He has had consistently elevated hemoglobin levels at least since 2023. The patient noticed the high levels and requested the referral. The patient denies any history of smoking or use of testosterone supplements. The patient has sleep apnea but is not using any treatment for it due to discomfort with the CPAP machine and mouth guard. The patient also reports daily alcohol consumption. The patient has not started any new medications recently. The patient denies any symptoms of chest pain, trouble breathing, dizziness, or lightheadedness. The patient's appetite has been stable and he has been maintaining his weight.  He denies any history of blood clots.     On his initial consultation with us  on 04/25/2023, labs showed persistent mild polycythemia with hemoglobin of 18.3, hematocrit 50.6, MCV 95.  White count and platelet count were normal.  Creatinine 1.3, otherwise unremarkable CMP.  LDH, iron studies, ferritin, vitamin B12 and folate were within normal limits.  Erythropoietin  level was normal.  JAK2 mutation analysis with reflex testing for CALR, MPL, exon 12-15 mutations were all negative, thus ruling out primary polycythemia.   In his case, most likely etiology for polycythemia is untreated OSA, causing secondary polycythemia.   Risks include increased blood viscosity, potential for clots, heart issues, or stroke, especially with hematocrit >55.    Plan for phlebotomy if hematocrit >55.  Currently no indication.  He was given the option of continued follow-up with us  versus continued  surveillance with PCP and patient opted to follow-up with his PCP.  Please reconsult us  again in case of hematocrit consistently above 55.  I have reviewed the past medical history, past surgical history, social history and family history with the patient and they are unchanged from previous note.  ALLERGIES: He is allergic to pollen extract.  MEDICATIONS:  Current Outpatient Medications  Medication Sig Dispense Refill   apixaban  (ELIQUIS ) 5 MG TABS tablet TAKE 1 TABLET BY MOUTH 2 TIMES A DAY 180 tablet 1   apixaban  (ELIQUIS ) 5 MG TABS tablet Take 1 tablet (5 mg total) by mouth 2 (two) times daily. 28 tablet 0   empagliflozin  (JARDIANCE ) 10 MG TABS tablet Take 1 tablet (10 mg total) by mouth daily before breakfast. 90 tablet 3   ferrous sulfate 325 (65 FE) MG tablet Take 325 mg by mouth in the morning.     metoprolol  succinate (TOPROL -XL) 100 MG 24 hr tablet Take 1 tablet (100 mg total) by mouth daily. TAKE WITH OR IMMEDIATELY FOLLOWING A MEAL. 90 tablet 3   Multiple Vitamin (MULTIVITAMIN) tablet Take 1 tablet by mouth 4 (four) times a week.     olopatadine (PATADAY) 0.1 % ophthalmic solution Place 1 drop into both eyes 2 (two) times daily as needed for allergies.     omeprazole  (PRILOSEC) 40 MG capsule TAKE 1 CAPSULE BY MOUTH TWICE A DAY 180 capsule 1   sildenafil  (VIAGRA ) 100 MG tablet Take 0.5-1 tablets (50-100 mg total)  by mouth daily as needed for erectile dysfunction. 90 tablet 0   tadalafil  (CIALIS ) 20 MG tablet Take 0.5-1 tablets (10-20 mg total) by mouth daily as needed for erectile dysfunction. 90 tablet 0   valACYclovir  (VALTREX ) 1000 MG tablet TAKE 1 TABLET BY MOUTH DAILY 90 tablet 0   fluticasone  (FLONASE) 50 MCG/ACT nasal spray Place 2 sprays into both nostrils daily as needed for allergies. (Patient not taking: Reported on 07/24/2023)     No current facility-administered medications for this visit.     REVIEW OF SYSTEMS:    Review of Systems - Oncology  All other pertinent  systems were reviewed with the patient and are negative.  PHYSICAL EXAMINATION:    Onc Performance Status - 07/24/23 1122       ECOG Perf Status   ECOG Perf Status Fully active, able to carry on all pre-disease performance without restriction      KPS SCALE   KPS % SCORE Normal, no compliants, no evidence of disease             Vitals:   07/24/23 1117  BP: 132/86  Pulse: 63  Resp: 14  Temp: 98.6 F (37 C)  SpO2: 97%   Filed Weights   07/24/23 1117  Weight: 210 lb 8 oz (95.5 kg)    Physical Exam Constitutional:      General: He is not in acute distress.    Appearance: Normal appearance.  HENT:     Head: Normocephalic and atraumatic.  Eyes:     Conjunctiva/sclera: Conjunctivae normal.  Cardiovascular:     Rate and Rhythm: Normal rate and regular rhythm.     Heart sounds: Normal heart sounds.  Pulmonary:     Effort: Pulmonary effort is normal. No respiratory distress.     Breath sounds: Normal breath sounds.  Abdominal:     General: There is no distension.  Neurological:     General: No focal deficit present.     Mental Status: He is alert and oriented to person, place, and time.  Psychiatric:        Mood and Affect: Mood normal.        Behavior: Behavior normal.     LABORATORY DATA:   I have reviewed the data as listed.  Results for orders placed or performed in visit on 07/24/23  CBC with Differential (Cancer Center Only)  Result Value Ref Range   WBC Count 6.1 4.0 - 10.5 K/uL   RBC 4.95 4.22 - 5.81 MIL/uL   Hemoglobin 16.9 13.0 - 17.0 g/dL   HCT 29.5 62.1 - 30.8 %   MCV 94.3 80.0 - 100.0 fL   MCH 34.1 (H) 26.0 - 34.0 pg   MCHC 36.2 (H) 30.0 - 36.0 g/dL   RDW 65.7 84.6 - 96.2 %   Platelet Count 198 150 - 400 K/uL   nRBC 0.0 0.0 - 0.2 %   Neutrophils Relative % 55 %   Neutro Abs 3.4 1.7 - 7.7 K/uL   Lymphocytes Relative 26 %   Lymphs Abs 1.6 0.7 - 4.0 K/uL   Monocytes Relative 14 %   Monocytes Absolute 0.8 0.1 - 1.0 K/uL   Eosinophils  Relative 3 %   Eosinophils Absolute 0.2 0.0 - 0.5 K/uL   Basophils Relative 1 %   Basophils Absolute 0.1 0.0 - 0.1 K/uL   Immature Granulocytes 1 %   Abs Immature Granulocytes 0.05 0.00 - 0.07 K/uL      RADIOGRAPHIC STUDIES:  No recent pertinent  imaging studies available to review.  No orders of the defined types were placed in this encounter.    No future appointments.   This document was completed utilizing speech recognition software. Grammatical errors, random word insertions, pronoun errors, and incomplete sentences are an occasional consequence of this system due to software limitations, ambient noise, and hardware issues. Any formal questions or concerns about the content, text or information contained within the body of this dictation should be directly addressed to the provider for clarification.

## 2023-07-24 NOTE — Assessment & Plan Note (Signed)
 Elevated hemoglobin (17.9) and hematocrit (52.8) noted in November 2024.   On his initial consultation with us  on 04/25/2023, labs showed persistent mild polycythemia with hemoglobin of 18.3, hematocrit 50.6, MCV 95.  White count and platelet count were normal.  Creatinine 1.3, otherwise unremarkable CMP.  LDH, iron studies, ferritin, vitamin B12 and folate were within normal limits.  Erythropoietin  level was normal.  JAK2 mutation analysis with reflex testing for CALR, MPL, exon 12-15 mutations were all negative, thus ruling out primary polycythemia.   In his case, most likely etiology for polycythemia is untreated OSA, causing secondary polycythemia.  Risks include increased blood viscosity, potential for clots, heart issues, or stroke, especially with hematocrit >55.   Labs today showed hemoglobin of 16.9, hematocrit 46.7.  MCV is now normal at 94.3.  White count and platelet count are normal.  Plan for phlebotomy if hematocrit >55.  Currently no indication.   He was given the option of continued follow-up with us  versus continued surveillance with PCP and patient opted to follow-up with his PCP.  Please reconsult us  again in case of hematocrit consistently above 55.  Thank you for giving us  an opportunity to participate in his care.

## 2023-07-24 NOTE — Assessment & Plan Note (Signed)
 Up to date on colonoscopy (2021) and endoscopy.

## 2023-07-24 NOTE — Assessment & Plan Note (Signed)
 Intermittent macrocytosis, likely alcohol induced.  Vitamin B12 and folic acid  were within normal limits in February 2025.  Currently MCV is normal.

## 2023-08-29 ENCOUNTER — Other Ambulatory Visit: Payer: Self-pay | Admitting: Family Medicine

## 2023-08-29 DIAGNOSIS — G4733 Obstructive sleep apnea (adult) (pediatric): Secondary | ICD-10-CM | POA: Diagnosis not present

## 2023-09-01 ENCOUNTER — Other Ambulatory Visit (HOSPITAL_COMMUNITY): Payer: Self-pay

## 2023-09-01 ENCOUNTER — Telehealth: Payer: Self-pay

## 2023-09-01 NOTE — Telephone Encounter (Signed)
 Pharmacy Patient Advocate Encounter   Received notification from Onbase that prior authorization for Omeprazole  40MG  dr capsules is required/requested.   Insurance verification completed.   The patient is insured through [facility] .   Per test claim: The current 30 day co-pay is, $8.06.  No PA needed at this time. This test claim was processed through [facility]- copay amounts may vary at other pharmacies due to pharmacy/plan contracts, or as the patient moves through the different stages of their insurance plan.      MAX QTY OF 90.000 IN 365 DAYSQUANTITY REMAINING 90.000 - Patient must fill the first 90 capsules before a PA can be done. Called pharmacy to fill 30 day supply and explained that we can do a PA after those 90 capsules are filled. Pharmacy received a paid claim.

## 2023-09-04 DIAGNOSIS — Z6827 Body mass index (BMI) 27.0-27.9, adult: Secondary | ICD-10-CM | POA: Diagnosis not present

## 2023-09-04 DIAGNOSIS — G4733 Obstructive sleep apnea (adult) (pediatric): Secondary | ICD-10-CM | POA: Diagnosis not present

## 2023-09-11 DIAGNOSIS — G4733 Obstructive sleep apnea (adult) (pediatric): Secondary | ICD-10-CM | POA: Diagnosis not present

## 2023-09-12 ENCOUNTER — Other Ambulatory Visit: Payer: Self-pay

## 2023-09-12 ENCOUNTER — Emergency Department (HOSPITAL_BASED_OUTPATIENT_CLINIC_OR_DEPARTMENT_OTHER)
Admission: EM | Admit: 2023-09-12 | Discharge: 2023-09-12 | Disposition: A | Discharge status: Home / Self Care | Attending: Emergency Medicine | Admitting: Emergency Medicine

## 2023-09-12 ENCOUNTER — Emergency Department (HOSPITAL_BASED_OUTPATIENT_CLINIC_OR_DEPARTMENT_OTHER)

## 2023-09-12 DIAGNOSIS — Z7901 Long term (current) use of anticoagulants: Secondary | ICD-10-CM | POA: Insufficient documentation

## 2023-09-12 DIAGNOSIS — S81811A Laceration without foreign body, right lower leg, initial encounter: Secondary | ICD-10-CM | POA: Insufficient documentation

## 2023-09-12 DIAGNOSIS — W25XXXA Contact with sharp glass, initial encounter: Secondary | ICD-10-CM | POA: Insufficient documentation

## 2023-09-12 MED ORDER — LIDOCAINE-EPINEPHRINE (PF) 2 %-1:200000 IJ SOLN
10.0000 mL | Freq: Once | INTRAMUSCULAR | Status: AC
  Administered 2023-09-12: 10 mL
  Filled 2023-09-12: qty 20

## 2023-09-12 MED ORDER — TETANUS-DIPHTH-ACELL PERTUSSIS 5-2.5-18.5 LF-MCG/0.5 IM SUSY
0.5000 mL | PREFILLED_SYRINGE | Freq: Once | INTRAMUSCULAR | Status: DC
  Filled 2023-09-12: qty 0.5

## 2023-09-12 NOTE — ED Notes (Signed)
 Laceration to left lower leg dressed with non-stick pad and secured with gauze. Pt tolerated well.

## 2023-09-12 NOTE — ED Triage Notes (Signed)
 Pt POV reporting laceration to L calf, was taking out trash, piece of glass cut L calf, pt on Eliquis , 3 inch lac noted, wound redressed with guaze and coban in triage, bleeding controlled at this time.

## 2023-09-12 NOTE — ED Provider Notes (Signed)
 [place] EMER[facility]ENC[facility] DEPARTMENT AT [facility] Provider Note   CSN: [id number] Arrival date & time: 09/12/23  2041     Patient presents with: Laceration   Jacob Buchanan is a 53 y.o. male.  {Add pertinent medical, surgical, social history, OB history to HPI:32947}  Laceration      Prior to Admission medications   Medication Sig Start Date End Date Taking? Authorizing Provider  apixaban  (ELIQUIS ) 5 MG TABS tablet TAKE 1 TABLET BY MOUTH 2 TIMES A DAY 04/27/23   Mealor, Augustus E, MD  apixaban  (ELIQUIS ) 5 MG TABS tablet Take 1 tablet (5 mg total) by mouth 2 (two) times daily. 05/04/23   Swinyer, Rosaline HERO, NP  empagliflozin  (JARDIANCE ) 10 MG TABS tablet Take 1 tablet (10 mg total) by mouth daily before breakfast. 05/04/23   Swinyer, Rosaline HERO, NP  ferrous sulfate 325 (65 FE) MG tablet Take 325 mg by mouth in the morning.    [provider]  fluticasone  (FLONASE) 50 MCG/ACT nasal spray Place 2 sprays into both nostrils daily as needed for allergies. Patient not taking: Reported on 07/24/2023    [provider]  metoprolol  succinate (TOPROL -XL) 100 MG 24 hr tablet Take 1 tablet (100 mg total) by mouth daily. TAKE WITH OR IMMEDIATELY FOLLOWING A MEAL. 06/05/23   Mealor, Augustus E, MD  Multiple Vitamin (MULTIVITAMIN) tablet Take 1 tablet by mouth 4 (four) times a week.    [provider]  olopatadine (PATADAY) 0.1 % ophthalmic solution Place 1 drop into both eyes 2 (two) times daily as needed for allergies.    [provider]  omeprazole  (PRILOSEC) 40 MG capsule TAKE 1 CAPSULE BY MOUTH TWICE A DAY 04/27/23   Kennyth Worth HERO, MD  sildenafil  (VIAGRA ) 100 MG tablet Take 0.5-1 tablets (50-100 mg total) by mouth daily as needed for erectile dysfunction. 07/05/22   Kennyth Worth HERO, MD  tadalafil  (CIALIS ) 20 MG tablet Take 0.5-1 tablets (10-20 mg total) by mouth daily as needed for erectile dysfunction. 07/05/22   Kennyth Worth HERO, MD  valACYclovir   (VALTREX ) 1000 MG tablet TAKE 1 TABLET BY MOUTH DAILY 08/29/23   Kennyth Worth HERO, MD    Allergies: Pollen extract    Review of Systems  Updated Vital Signs BP (!) 125/97 (BP Location: Right Arm)   Pulse 81   Temp 98 F (36.7 C)   Resp 18   Ht 6' 1 (1.854 m)   Wt 93 kg   SpO2 95%   BMI 27.05 kg/m   Physical Exam  (all labs ordered are listed, but only abnormal results are displayed) Labs Reviewed - No data to display  EKG: None  Radiology: No results found.  {Document cardiac monitor, telemetry assessment procedure when appropriate:32947} Procedures   Medications Ordered in the ED  lidocaine -EPINEPHrine  (XYLOCAINE  W/EPI) 2 %-1:200000 (PF) injection 10 mL (has no administration in time range)  Tdap (BOOSTRIX ) injection 0.5 mL (has no administration in time range)      {Click here for ABCD2, HEART and other calculators REFRESH Note before signing:1}                              Medical Decision Making Risk Prescription drug management.   ***  {Document critical care time when appropriate  Document review of labs and clinical decision tools ie CHADS2VASC2, etc  Document your independent review of radiology images and any outside records  Document your discussion with family  members, caretakers and with consultants  Document social determinants of health affecting pt's care  Document your decision making why or why not admission, treatments were needed:32947:::1}   Final diagnoses:  None    ED Discharge Orders     None

## 2023-09-12 NOTE — ED Notes (Signed)
Reviewed discharge instructions and home care with pt. Pt verbalized understanding and had no further questions. Pt exited ED without complications.

## 2023-09-12 NOTE — ED Notes (Signed)
 ED Provider at bedside.

## 2023-09-12 NOTE — ED Notes (Signed)
Refused X-ray

## 2023-09-12 NOTE — Discharge Instructions (Signed)
 As we discussed, you do not have to stop your Eliquis . Follow up with Dr. Kennyth in 10 days for suture removal. Go sooner with any sign of infection - fever, increasing pain or redness, drainage from the wound. Let Dr. Kennyth know you received a tetanus shot so that he can update your office immunization records.

## 2023-09-25 ENCOUNTER — Ambulatory Visit: Admitting: Family Medicine

## 2023-09-25 VITALS — BP 108/80 | HR 52 | Temp 98.1°F | Ht 73.0 in | Wt 206.2 lb

## 2023-09-25 DIAGNOSIS — S81812D Laceration without foreign body, left lower leg, subsequent encounter: Secondary | ICD-10-CM | POA: Diagnosis not present

## 2023-09-25 NOTE — Progress Notes (Signed)
   Jacob Buchanan is a 53 y.o. male who presents today for an office visit.  Assessment/Plan:  Left Leg laceration Sutures were removed today.  He tolerated well.  Steri-Strips were placed.  We discussed care management.  He will let us  know if he develops any signs of infection or if wound does not continue to heal over the next several weeks.    Subjective:  HPI:  Patient here today for ED follow-up.  Went to ED 13 days ago due to laceration caused by a a shard of glass in a trash bag.  The laceration on his left lower leg was repaired with 8 simple interrupted sutures.  Tetanus was updated as well.  He was discharged home.  He has done well since being home.  Has been keeping the area clean and dry.       Objective:  Physical Exam: BP 108/80 (BP Location: Right Arm, Patient Position: Sitting, Cuff Size: Normal)   Pulse (!) 52   Temp 98.1 F (36.7 C) (Temporal)   Ht 6' 1 (1.854 m)   Wt 206 lb 3.2 oz (93.5 kg)   SpO2 98%   BMI 27.20 kg/m   Gen: No acute distress, resting comfortably CV: Regular rate and rhythm with no murmurs appreciated Pulm: Normal work of breathing, clear to auscultation bilaterally with no crackles, wheezes, or rhonchi MUSCULOSKELETAL: Approximately 4 to 5 cm well-healed laceration on posterior left leg.  Neuro: Grossly normal, moves all extremities Psych: Normal affect and thought content  Procedure note Verbal consent was obtained.  Above sutures were removed without complication.  Steri-Strips were placed after procedure.  Tolerated well.  Time Spent: 30 minutes of total time was spent on the date of the encounter performing the following actions: chart review prior to seeing the patient including recent Emergency Department note, obtaining history, performing a medically necessary exam, removing above sutures, counseling on the treatment plan, placing orders, and documenting in our EHR.        Worth HERO. Kennyth, MD 09/25/2023 11:35 AM

## 2023-09-25 NOTE — Patient Instructions (Signed)
 It was very nice to see you today!  We removed your staples today.  Will place Steri-Strips to the area.  They should come off with next 5 to 7 days.  Please let us  know if you have any signs of infection or if the wound does not continue to heal over the next several weeks.  Return if symptoms worsen or fail to improve.   Take care, Dr Kennyth  PLEASE NOTE:  If you had any lab tests, please let us  know if you have not heard back within a few days. You may see your results on mychart before we have a chance to review them but we will give you a call once they are reviewed by us .   If we ordered any referrals today, please let us  know if you have not heard from their office within the next week.   If you had any urgent prescriptions sent in today, please check with the pharmacy within an hour of our visit to make sure the prescription was transmitted appropriately.   Please try these tips to maintain a healthy lifestyle:  Eat at least 3 REAL meals and 1-2 snacks per day.  Aim for no more than 5 hours between eating.  If you eat breakfast, please do so within one hour of getting up.   Each meal should contain half fruits/vegetables, one quarter protein, and one quarter carbs (no bigger than a computer mouse)  Cut down on sweet beverages. This includes juice, soda, and sweet tea.   Drink at least 1 glass of water with each meal and aim for at least 8 glasses per day  Exercise at least 150 minutes every week.

## 2023-09-27 ENCOUNTER — Ambulatory Visit (HOSPITAL_BASED_OUTPATIENT_CLINIC_OR_DEPARTMENT_OTHER): Admitting: Pulmonary Disease

## 2023-10-16 ENCOUNTER — Encounter: Payer: Self-pay | Admitting: Nurse Practitioner

## 2023-10-16 ENCOUNTER — Ambulatory Visit (INDEPENDENT_AMBULATORY_CARE_PROVIDER_SITE_OTHER): Admitting: Nurse Practitioner

## 2023-10-16 VITALS — BP 130/86 | HR 68 | Temp 98.6°F | Ht 73.0 in | Wt 212.0 lb

## 2023-10-16 DIAGNOSIS — G4733 Obstructive sleep apnea (adult) (pediatric): Secondary | ICD-10-CM | POA: Diagnosis not present

## 2023-10-16 NOTE — Assessment & Plan Note (Addendum)
 Severe OSA, intolerant of CPAP. 09/04/2023: Inspire implant with Dr. Carlie 10/17/2023: Inspire activation   Very slight deviation of the tongue to the right; unclear if this is an anatomical variant or post operative change. He had strong movement and no other findings associated with neuropraxia. Sensation and functional levels were evaluated. He initially had minimal sensation at 0.5 V. At 1 and 1.1 V, there was some very slight impulse movement but no forward motion/protrusion. Functional level was set at 1.2 V with comfortable slight protrusion of the tongue. Possible there is still some ongoing nerve healing. Will need to monitor moving forward. Range was set at 1.2-2.2 V. He was set at level 1 (1.2 V).  Hypoglossal nerve stimulator was assessed and activated today.  Incision sites appear good, tongue protrusion was examined.  He denied any discomfort.  Device was set to conventional configuration+/-/+ Programming :  1.  Stimulation level : Sensation 0.5 V , functional level 1.2 V lower limit 1.2, upper limit 2.2 V.  He was set at level 1 2.  Start delay 30 minutes, pause time 15 minutes, duration 9 hours 3.  Sensing waveform was analyzed for 3 minutes 4.  Sleep remote education was provided patient demonstrated competency with the remote and was aware of patient Instruction videos and sleep remote guide 5.  Patient was instructed to step up levels by 1 level (0.1 V ) every week  6.  Check-in visit in presleep study in 6 weeks to ensure that they are stepping up levels, using therapy  all night, every night and to evaluate subjective benefit.   7.  Inspire titration sleep study will be scheduled 2 to 4 weeks after the prestudy visit to verify efficacy of device  Patient Instructions  Inspire activated today  You were set at a level 1 today. You will stay here for 1 week and then start increasing every Monday by 1 level. If you ever get to a level where you are uncomfortable, you will back down  by one level and stay there for a week and then try to increase by 1 level again. Always double check the back of your remote to ensure you are on the correct level   If you even feel uncomfortable or have discomfort, call me and let me know if it's not fixed by decreasing your level by one level  We reviewed remote teaching today and connected your Inspire remote to your phone  You need to open the app on your phone at least once a week so the data will pull over   You should slowly start to notice benefit from use as you go up in your levels but it can take time so be patient and do not increase any faster than once a week   Take your remote with you to all follow up appointments, dental procedures, diagnostic imaging, surgical procedures, etc. If you are traveling, make sure you have your implant card with you   Your Elizabeth will turn on after you are asleep. We set the onset for 30 minutes, pause 15 minutes and duration of 9 hours. If the Elizabeth is not running when  you are waking up in the mornings, please let us  know so we can extend the duration   Follow up 11/27/2023 with Dr. Jude at the [facility] office. If symptoms do not improve or worsen, please contact office for sooner follow up

## 2023-10-16 NOTE — Patient Instructions (Addendum)
 Inspire activated today  You were set at a level 1 today. You will stay here for 1 week and then start increasing every Monday by 1 level. If you ever get to a level where you are uncomfortable, you will back down by one level and stay there for a week and then try to increase by 1 level again. Always double check the back of your remote to ensure you are on the correct level   If you even feel uncomfortable or have discomfort, call me and let me know if it's not fixed by decreasing your level by one level  We reviewed remote teaching today and connected your Inspire remote to your phone  You need to open the app on your phone at least once a week so the data will pull over   You should slowly start to notice benefit from use as you go up in your levels but it can take time so be patient and do not increase any faster than once a week   Take your remote with you to all follow up appointments, dental procedures, diagnostic imaging, surgical procedures, etc. If you are traveling, make sure you have your implant card with you   Your Jacob Buchanan will turn on after you are asleep. We set the onset for 30 minutes, pause 15 minutes and duration of 9 hours. If the Jacob Buchanan is not running when  you are waking up in the mornings, please let us  know so we can extend the duration   Follow up 11/27/2023 with Dr. Jude at the [facility] office. If symptoms do not improve or worsen, please contact office for sooner follow up

## 2023-10-16 NOTE — Progress Notes (Signed)
 @Patient  ID: Jacob Buchanan, male    DOB: 10/24/1970, 53 y.o.   MRN: [id number]  Chief Complaint  Patient presents with   Consult    Referring provider: Kennyth Worth HERO, MD  HPI: 53 year old male, never smoker referred for sleep consult to establish for OSA on Inspire. Past medical history significant for PAF, asthma, GERD, hyperthyroid, ED, HLD, depression.   TEST/EVENTS:  02/09/2018 HST: AHI 75.9/h, SpO2 low 75% 06/21/2023 HST: AHI NREM AHI 70.8/h, REM AHI 50.2, SpO2 low 61%  10/16/2023: Today - sleep consult  Discussed the use of AI scribe software for clinical note transcription with the patient, who gave verbal consent to proceed.  History of Present Illness Jacob Buchanan is a 53 year old male with severe sleep apnea who presents for post-operative follow-up and device activation after Inspire surgery.  He underwent Inspire surgery on June 30th for the management of severe sleep apnea. No post-operative complications were reported, including issues with speech or swallowing.  He has a history of severe sleep apnea and previously attempted CPAP therapy, which he could not tolerate. He experiences nocturnal snoring and daytime fatigue, though he has become accustomed to these symptoms. No drowsy driving, falling asleep while driving, morning headaches, or sleepwalking. He does not use sleep medications.  He sleeps approximately nine hours per night, waking two to three times to use the bathroom. He falls asleep quickly, usually within 30 seconds to a minute. He consumes alcohol a couple of days a week and drinks coffee in the morning, rarely in the afternoon.  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 Lives with his wife.  [place] [address]   Allergies  Allergen Reactions   Pollen Extract     Immunization History  Administered Date(s) Administered   PFIZER(Purple Top)SARS-COV-2 Vaccination 05/16/2019, 06/06/2019, 09/04/2019, 10/05/2019, 02/22/2020   Td 09/14/2007   Tdap  06/18/2020   Zoster Recombinant(Shingrix) 06/23/2020, 09/24/2020    Past Medical History:  Diagnosis Date   Anemia    Atrial fibrillation (HCC)    CHF (congestive heart failure) (HCC)    ED (erectile dysfunction)    Esophageal stricture    GERD (gastroesophageal reflux disease)    Bertrum syndrome    HSV (herpes simplex virus) anogenital infection    Hyperlipidemia    Incomplete right bundle branch block    Partial tear of right rotator cuff    Sleep apnea     Tobacco History: Social History   Tobacco Use  Smoking Status Never  Smokeless Tobacco Never  Tobacco Comments   Never smoke 03/09/22   Counseling given: Not Answered Tobacco comments: Never smoke 03/09/22   Outpatient Medications Prior to Visit  Medication Sig Dispense Refill   apixaban  (ELIQUIS ) 5 MG TABS tablet TAKE 1 TABLET BY MOUTH 2 TIMES A DAY 180 tablet 1   apixaban  (ELIQUIS ) 5 MG TABS tablet Take 1 tablet (5 mg total) by mouth 2 (two) times daily. 28 tablet 0   empagliflozin  (JARDIANCE ) 10 MG TABS tablet Take 1 tablet (10 mg total) by mouth daily before breakfast. 90 tablet 3   ferrous sulfate 325 (65 FE) MG tablet Take 325 mg by mouth in the morning.     fluticasone  (FLONASE) 50 MCG/ACT nasal spray Place 2 sprays into both nostrils daily as needed for allergies.     HYDROcodone -acetaminophen  (NORCO/VICODIN) 5-325 MG tablet Take 1 tablet by mouth every 4 (four) hours as needed.     metoprolol  succinate (TOPROL -XL) 100 MG 24 hr tablet  Take 1 tablet (100 mg total) by mouth daily. TAKE WITH OR IMMEDIATELY FOLLOWING A MEAL. 90 tablet 3   Multiple Vitamin (MULTIVITAMIN) tablet Take 1 tablet by mouth 4 (four) times a week.     olopatadine (PATADAY) 0.1 % ophthalmic solution Place 1 drop into both eyes 2 (two) times daily as needed for allergies.     omeprazole  (PRILOSEC) 40 MG capsule TAKE 1 CAPSULE BY MOUTH TWICE A DAY 180 capsule 1   sildenafil  (VIAGRA ) 100 MG tablet Take 0.5-1 tablets (50-100 mg total) by  mouth daily as needed for erectile dysfunction. 90 tablet 0   tadalafil  (CIALIS ) 20 MG tablet Take 0.5-1 tablets (10-20 mg total) by mouth daily as needed for erectile dysfunction. 90 tablet 0   valACYclovir  (VALTREX ) 1000 MG tablet TAKE 1 TABLET BY MOUTH DAILY 90 tablet 0   No facility-administered medications prior to visit.     Review of Systems:   Constitutional: No weight loss or gain, night sweats +fatigue  HEENT: No headaches CV:  No chest pain, orthopnea, PND,  palpitations Resp: +snoring, witnessed apneas GU: +nocturia  Neuro: No memory impairment  Psych: No depression or anxiety. Mood stable. +sleep disturbance    Physical Exam:  BP 130/86 (BP Location: Left Arm, Patient Position: Sitting, Cuff Size: Normal)   Pulse 68   Temp 98.6 F (37 C) (Oral)   Ht 6' 1 (1.854 m)   Wt 212 lb (96.2 kg)   SpO2 97%   BMI 27.97 kg/m   GEN: Pleasant, interactive, well-appearing; in no acute distress HEENT:  Normocephalic and atraumatic. PERRLA. Sclera white. Nasal turbinates pink, moist and patent bilaterally. No rhinorrhea present. Oropharynx pink and moist, without exudate or edema. No lesions, ulcerations, or postnasal drip. Mallampati III. Slight deviation of tongue to right. Strong movement. NECK:  Supple w/ fair ROM. No lymphadenopathy.   CV: RRR, no m/r/g, no peripheral edema. Pulses intact, +2 bilaterally. No cyanosis, pallor or clubbing. PULMONARY:  Unlabored, regular breathing. Clear bilaterally A&P w/o wheezes/rales/rhonchi.  GI: BS present and normoactive. Soft, non-tender to palpation.  MSK: No erythema, warmth or tenderness. Cap refil <2 sec all extrem.  Neuro: A/Ox3. No focal deficits noted.   Skin: Warm, no lesions or rashes. Clean, dry intact and well approximated incision to right neck and right anterior chest wall Psych: Normal affect and behavior. Judgement and thought content appropriate.     Lab Results:  CBC    Component Value Date/Time   WBC 6.1  07/24/2023 1105   WBC 6.7 06/23/2023 1720   RBC 4.95 07/24/2023 1105   HGB 16.9 07/24/2023 1105   HGB 17.3 08/09/2022 1132   HCT 46.7 07/24/2023 1105   HCT 49.6 08/09/2022 1132   PLT 198 07/24/2023 1105   PLT 198 08/09/2022 1132   MCV 94.3 07/24/2023 1105   MCV 99 (H) 08/09/2022 1132   MCH 34.1 (H) 07/24/2023 1105   MCHC 36.2 (H) 07/24/2023 1105   RDW 11.9 07/24/2023 1105   RDW 12.7 08/09/2022 1132   LYMPHSABS 1.6 07/24/2023 1105   MONOABS 0.8 07/24/2023 1105   EOSABS 0.2 07/24/2023 1105   BASOSABS 0.1 07/24/2023 1105    BMET    Component Value Date/Time   NA 139 06/23/2023 1720   NA 140 08/09/2022 1132   K 3.9 06/23/2023 1720   CL 105 06/23/2023 1720   CO2 27 06/23/2023 1720   GLUCOSE 117 (H) 06/23/2023 1720   BUN 18 06/23/2023 1720   BUN 13 08/09/2022 1132  CREATININE 1.26 (H) 06/23/2023 1720   CREATININE 1.30 (H) 04/25/2023 1122   CALCIUM 9.2 06/23/2023 1720   GFRNONAA >60 06/23/2023 1720   GFRNONAA >60 04/25/2023 1122   GFRAA 96 09/05/2007 1201    BNP    Component Value Date/Time   BNP 273.5 (H) 02/27/2022 2148     Imaging:  No results found.  Administration History     None           No data to display          No results found for: NITRICOXIDE      Assessment & Plan:   Severe obstructive sleep apnea Severe OSA, intolerant of CPAP. 09/04/2023: Inspire implant with Dr. Carlie 10/17/2023: Inspire activation   Very slight deviation of the tongue to the right; unclear if this is an anatomical variant or post operative change. He had strong movement and no other findings associated with neuropraxia. Sensation and functional levels were evaluated. He initially had minimal sensation at 0.5 V. At 1 and 1.1 V, there was some very slight impulse movement but no forward motion/protrusion. Functional level was set at 1.2 V with comfortable slight protrusion of the tongue. Possible there is still some ongoing nerve healing. Will need to monitor  moving forward. Range was set at 1.2-2.2 V. He was set at level 1 (1.2 V).  Hypoglossal nerve stimulator was assessed and activated today.  Incision sites appear good, tongue protrusion was examined.  He denied any discomfort.  Device was set to conventional configuration+/-/+ Programming :  1.  Stimulation level : Sensation 0.5 V , functional level 1.2 V lower limit 1.2, upper limit 2.2 V.  He was set at level 1 2.  Start delay 30 minutes, pause time 15 minutes, duration 9 hours 3.  Sensing waveform was analyzed for 3 minutes 4.  Sleep remote education was provided patient demonstrated competency with the remote and was aware of patient Instruction videos and sleep remote guide 5.  Patient was instructed to step up levels by 1 level (0.1 V ) every week  6.  Check-in visit in presleep study in 6 weeks to ensure that they are stepping up levels, using therapy  all night, every night and to evaluate subjective benefit.   7.  Inspire titration sleep study will be scheduled 2 to 4 weeks after the prestudy visit to verify efficacy of device  Patient Instructions  Inspire activated today  You were set at a level 1 today. You will stay here for 1 week and then start increasing every Monday by 1 level. If you ever get to a level where you are uncomfortable, you will back down by one level and stay there for a week and then try to increase by 1 level again. Always double check the back of your remote to ensure you are on the correct level   If you even feel uncomfortable or have discomfort, call me and let me know if it's not fixed by decreasing your level by one level  We reviewed remote teaching today and connected your Inspire remote to your phone  You need to open the app on your phone at least once a week so the data will pull over   You should slowly start to notice benefit from use as you go up in your levels but it can take time so be patient and do not increase any faster than once a week    Take your remote with you to all follow  up appointments, dental procedures, diagnostic imaging, surgical procedures, etc. If you are traveling, make sure you have your implant card with you   Your Elizabeth will turn on after you are asleep. We set the onset for 30 minutes, pause 15 minutes and duration of 9 hours. If the Elizabeth is not running when  you are waking up in the mornings, please let us  know so we can extend the duration   Follow up 11/27/2023 with Dr. Jude at the [facility] office. If symptoms do not improve or worsen, please contact office for sooner follow up   Advised if symptoms do not improve or worsen, to please contact office for sooner follow up or seek emergency care.   I spent 60 minutes of dedicated to the care of this patient on the date of this encounter to include pre-visit review of records, face-to-face time with the patient discussing conditions above, post visit ordering of testing, clinical documentation with the electronic health record, making appropriate referrals as documented, and communicating necessary findings to members of the patients care team.  Comer LULLA Rouleau, NP 10/17/2023  Pt aware and understands NP's role.

## 2023-10-17 ENCOUNTER — Encounter: Payer: Self-pay | Admitting: Nurse Practitioner

## 2023-10-18 ENCOUNTER — Encounter: Payer: Self-pay | Admitting: Nurse Practitioner

## 2023-10-24 ENCOUNTER — Ambulatory Visit: Admitting: Physician Assistant

## 2023-10-24 ENCOUNTER — Ambulatory Visit (INDEPENDENT_AMBULATORY_CARE_PROVIDER_SITE_OTHER): Admitting: Physician Assistant

## 2023-10-24 ENCOUNTER — Encounter: Payer: Self-pay | Admitting: Physician Assistant

## 2023-10-24 VITALS — BP 130/86 | HR 55 | Temp 98.2°F | Ht 73.0 in | Wt 209.0 lb

## 2023-10-24 DIAGNOSIS — R051 Acute cough: Secondary | ICD-10-CM | POA: Diagnosis not present

## 2023-10-24 DIAGNOSIS — J4520 Mild intermittent asthma, uncomplicated: Secondary | ICD-10-CM | POA: Diagnosis not present

## 2023-10-24 MED ORDER — AIRSUPRA 90-80 MCG/ACT IN AERO: 2.0000 | INHALATION_SPRAY | Freq: Four times a day (QID) | RESPIRATORY_TRACT | 1 refills | Status: DC | PRN

## 2023-10-24 MED ORDER — AZITHROMYCIN 250 MG PO TABS: ORAL_TABLET | ORAL | 0 refills | Status: AC

## 2023-10-24 NOTE — Patient Instructions (Signed)
 It was great to see you!  Start azithromycin  antibiotic(s)  Trial 12-hour delsym  over the counter cough syrup (generic is fine!)   Trial as needed airsupra  for your cough  Follow up if new/worsening symptom(s) or lack of improvement  Take care,  Lucie Buttner PA-C

## 2023-10-24 NOTE — Progress Notes (Signed)
 Jacob Buchanan is a 53 y.o. male here for a new problem.  History of Present Illness:   Chief Complaint  Patient presents with   Cough    Pt c/o cough and congestion x 1 week, expectorating clear sputum, sinus pressure. Denies fever or chills. Has been using Nyquil, Dayquil, Astelin  nasal spray.    Discussed the use of AI scribe software for clinical note transcription with the patient, who gave verbal consent to proceed.  History of Present Illness Jacob Buchanan is a 53 year old male with asthma who presents with upper respiratory symptoms and cough.  He developed significant sinus pressure, nasal congestion, and rhinorrhea with yellow-green discharge about a week ago. Symptoms have progressed to include postnasal drip, wheezing, and a persistent cough, especially when supine. He denies fever or chills. Energy levels and appetite are decent, though eating is challenging due to mouth breathing.  He has asthma but has not used an inhaler recently. Fall seasonal allergies have been starting earlier. He recently had Inspire activated for sleep apnea and cannot tolerate a CPAP machine.  He traveled to [place], [place], and [place] between August 2nd and 10th. His partner has similar symptoms, though less severe. He works from home and is not frequently around others.  He experiences difficulty sleeping due to coughing, which worsens when lying down. He uses NyQuil at night and DayQuil during the day, finding some relief when upright and moving. He has leftover Tessalon  Perles and hydrocodone  cough syrup from prior illness last year -- but has not used.    Past Medical History:  Diagnosis Date   Anemia    Atrial fibrillation (HCC)    CHF (congestive heart failure) (HCC)    ED (erectile dysfunction)    Esophageal stricture    GERD (gastroesophageal reflux disease)    Bertrum syndrome    HSV (herpes simplex virus) anogenital infection    Hyperlipidemia    Incomplete right  bundle branch block    Partial tear of right rotator cuff    Sleep apnea      Social History   Tobacco Use   Smoking status: Never   Smokeless tobacco: Never   Tobacco comments:    Never smoke 03/09/22  Vaping Use   Vaping status: Never Used  Substance Use Topics   Alcohol use: Not Currently    Alcohol/week: 14.0 standard drinks of alcohol    Types: 6 Glasses of wine, 8 Cans of beer per week   Drug use: Yes    Types: Marijuana    Comment: routine use of marijuana, occasional mushrooms    Past Surgical History:  Procedure Laterality Date   ATRIAL FIBRILLATION ABLATION N/A 08/31/2022   Procedure: ATRIAL FIBRILLATION ABLATION;  Surgeon: Nancey Eulas BRAVO, MD;  Location: [facility];  Service: Cardiovascular;  Laterality: N/A;   CARDIOVERSION N/A 03/29/2022   Procedure: CARDIOVERSION;  Surgeon: Francyne Headland, MD;  Location: [facility] ENDOSCOPY;  Service: Cardiovascular;  Laterality: N/A;   CARDIOVERSION N/A 06/07/2022   Procedure: CARDIOVERSION;  Surgeon: Mona Vinie JAYSON, MD;  Location: [facility] ENDOSCOPY;  Service: Cardiovascular;  Laterality: N/A;   COLONOSCOPY  2004 ; 2007; & 05-29-2012   last one with propofol  and hemorroidectomy with sclerosing   COLONOSCOPY  09/05/2019   DRUG INDUCED ENDOSCOPY Bilateral 10/31/2022   Procedure: DRUG INDUCED SLEEP  ENDOSCOPY;  Surgeon: Carlie Clark, MD;  Location: [facility] OR;  Service: ENT;  Laterality: Bilateral;   FINGER SURGERY  1994   repair/ reconstruction right  index crush injury   INGUINAL HERNIA REPAIR Bilateral age 3   SHOULDER ARTHROSCOPY WITH LABRAL REPAIR Right 12/31/2013   Procedure: RIGHT SHOULDER ARTHROSCOPY WITH LABRAL REPAIR ;  Surgeon: Lamar Collet, MD;  Location: [facility];  Service: Orthopedics;  Laterality: Right;   UPPER GI ENDOSCOPY  2015   esophageal dilation    Family History  Problem Relation Age of Onset   Lung cancer Maternal Grandfather    Other Sister        Diabetes Insipidus   Rectal cancer  Sister    Mitral valve prolapse Father    Ulcers Sister        peptic   Emphysema Paternal Grandfather    Lung cancer Paternal Grandfather    Stroke Neg Hx    Heart disease Neg Hx    Colon cancer Neg Hx    Colon polyps Neg Hx    Esophageal cancer Neg Hx    Stomach cancer Neg Hx     Allergies  Allergen Reactions   Pollen Extract     Current Medications:   Current Outpatient Medications:    apixaban  (ELIQUIS ) 5 MG TABS tablet, Take 1 tablet (5 mg total) by mouth 2 (two) times daily., Disp: 28 tablet, Rfl: 0   empagliflozin  (JARDIANCE ) 10 MG TABS tablet, Take 1 tablet (10 mg total) by mouth daily before breakfast., Disp: 90 tablet, Rfl: 3   ferrous sulfate 325 (65 FE) MG tablet, Take 325 mg by mouth in the morning., Disp: , Rfl:    metoprolol  succinate (TOPROL -XL) 100 MG 24 hr tablet, Take 1 tablet (100 mg total) by mouth daily. TAKE WITH OR IMMEDIATELY FOLLOWING A MEAL., Disp: 90 tablet, Rfl: 3   Multiple Vitamin (MULTIVITAMIN) tablet, Take 1 tablet by mouth 4 (four) times a week., Disp: , Rfl:    olopatadine (PATADAY) 0.1 % ophthalmic solution, Place 1 drop into both eyes 2 (two) times daily as needed for allergies., Disp: , Rfl:    omeprazole  (PRILOSEC) 40 MG capsule, TAKE 1 CAPSULE BY MOUTH TWICE A DAY, Disp: 180 capsule, Rfl: 1   sildenafil  (VIAGRA ) 100 MG tablet, Take 0.5-1 tablets (50-100 mg total) by mouth daily as needed for erectile dysfunction., Disp: 90 tablet, Rfl: 0   tadalafil  (CIALIS ) 20 MG tablet, Take 0.5-1 tablets (10-20 mg total) by mouth daily as needed for erectile dysfunction., Disp: 90 tablet, Rfl: 0   valACYclovir  (VALTREX ) 1000 MG tablet, TAKE 1 TABLET BY MOUTH DAILY, Disp: 90 tablet, Rfl: 0   Review of Systems:   Negative unless otherwise specified per HPI.  Vitals:   Vitals:   10/24/23 1027  BP: 130/86  Pulse: (!) 55  Temp: 98.2 F (36.8 C)  TempSrc: Temporal  SpO2: 96%  Weight: 209 lb (94.8 kg)  Height: 6' 1 (1.854 m)     Body mass index is  27.57 kg/m.  Physical Exam:   Physical Exam Vitals and nursing note reviewed.  Constitutional:      General: He is not in acute distress.    Appearance: He is well-developed. He is not ill-appearing or toxic-appearing.  HENT:     Head: Normocephalic and atraumatic.     Right Ear: Ear canal and external ear normal. A middle ear effusion is present. Tympanic membrane is not erythematous, retracted or bulging.     Left Ear: Ear canal and external ear normal. A middle ear effusion is present. Tympanic membrane is not erythematous, retracted or bulging.     Nose: Nose  normal.     Right Sinus: No maxillary sinus tenderness or frontal sinus tenderness.     Left Sinus: No maxillary sinus tenderness or frontal sinus tenderness.     Mouth/Throat:     Pharynx: Uvula midline. Posterior oropharyngeal erythema present.  Eyes:     General: Lids are normal.     Conjunctiva/sclera: Conjunctivae normal.  Neck:     Trachea: Trachea normal.  Cardiovascular:     Rate and Rhythm: Normal rate and regular rhythm.     Heart sounds: Normal heart sounds, S1 normal and S2 normal.  Pulmonary:     Effort: Pulmonary effort is normal.     Breath sounds: Normal breath sounds. No decreased breath sounds, wheezing, rhonchi or rales.  Lymphadenopathy:     Cervical: No cervical adenopathy.  Skin:    General: Skin is warm and dry.  Neurological:     Mental Status: He is alert.  Psychiatric:        Speech: Speech normal.        Behavior: Behavior normal. Behavior is cooperative.     Assessment and Plan:   Assessment and Plan Assessment & Plan Acute upper respiratory infection with sinus involvement Sinus pressure, nasal congestion, postnasal drip, and chest involvement with wheezing and cough. Yellow-green discharge suggests bacterial infection. Risk of progression to pneumonia if untreated. - Prescribed antibiotic for bacterial infection. - Recommended NyQuil at night and DayQuil during the day as  needed. - Suggested Delsym  as an over-the-counter cough suppressant before bed. - Advised use of hydrocodone  cough syrup sparingly if needed. - Consider chest x-ray and different antibiotic if no improvement.  Asthma Exacerbation likely due to postnasal drip from upper respiratory infection. Inhaler recommended to prevent further exacerbation. - Prescribed Aisupra inhaler with steroid component for use before bed and as needed during the day. - Provided coupon for inhaler in case of insurance issues.   Lucie Buttner, PA-C

## 2023-10-26 ENCOUNTER — Other Ambulatory Visit (HOSPITAL_COMMUNITY): Payer: Self-pay

## 2023-10-26 ENCOUNTER — Ambulatory Visit (HOSPITAL_BASED_OUTPATIENT_CLINIC_OR_DEPARTMENT_OTHER): Admitting: Pulmonary Disease

## 2023-10-26 ENCOUNTER — Telehealth: Payer: Self-pay

## 2023-10-26 NOTE — Telephone Encounter (Signed)
 Pharmacy Patient Advocate Encounter   Received notification from Onbase that prior authorization for Airsupra  90-80MCG/ACT aerosol is required/requested.   Insurance verification completed.   The patient is insured through [facility] .   Per test claim: PA required; PA submitted to above mentioned insurance via Latent Key/confirmation #/EOC [id number] Status is pending

## 2023-10-27 NOTE — Telephone Encounter (Signed)
 Pharmacy Patient Advocate Encounter  Received notification from [employer] that Prior Authorization for  Airsupra  90-80MCG/ACT aeroso  has been APPROVED from 10/26/23 to 10/25/24   PA #/Case ID/Reference #: [id number]

## 2023-10-27 NOTE — Telephone Encounter (Signed)
 LVM with information Airsupra  90-80MCG/ACT aeroso  has been APPROVED from 10/26/23 to 10/25/24

## 2023-10-31 ENCOUNTER — Other Ambulatory Visit: Payer: Self-pay | Admitting: Cardiovascular Disease

## 2023-10-31 NOTE — Telephone Encounter (Signed)
 Prescription refill request for Eliquis  received. Indication:afib Last office visit:3/25 Scr:1.26  4/25 Age: 53 Weight:94.8  kg  Prescription refilled

## 2023-11-09 ENCOUNTER — Encounter: Payer: Self-pay | Admitting: Cardiovascular Disease

## 2023-11-15 ENCOUNTER — Encounter: Payer: Self-pay | Admitting: Family Medicine

## 2023-11-16 NOTE — Telephone Encounter (Signed)
 Can we clarify with patient? Is there a reason he is asking?  HE can come in for early morning cortisol check if he wants. This is not a routine lab that we check.

## 2023-11-16 NOTE — Telephone Encounter (Signed)
 Please advice

## 2023-11-20 NOTE — Telephone Encounter (Signed)
 Left message to return call to our office at their convenience.

## 2023-11-27 ENCOUNTER — Ambulatory Visit (HOSPITAL_BASED_OUTPATIENT_CLINIC_OR_DEPARTMENT_OTHER): Admitting: Pulmonary Disease

## 2023-11-27 ENCOUNTER — Encounter (HOSPITAL_BASED_OUTPATIENT_CLINIC_OR_DEPARTMENT_OTHER): Payer: Self-pay | Admitting: Pulmonary Disease

## 2023-11-27 VITALS — BP 133/91 | HR 75 | Ht 73.0 in | Wt 213.0 lb

## 2023-11-27 DIAGNOSIS — Z4542 Encounter for adjustment and management of neuropacemaker (brain) (peripheral nerve) (spinal cord): Secondary | ICD-10-CM

## 2023-11-27 DIAGNOSIS — G4733 Obstructive sleep apnea (adult) (pediatric): Secondary | ICD-10-CM | POA: Diagnosis not present

## 2023-11-27 NOTE — Patient Instructions (Signed)
 Your device was adjusted today Start delay 23m Pause time 15 m Duration 9h  Level 7 = 1.8 V Increase by 1 level every week until tongue discomfort  Try to use every night

## 2023-11-27 NOTE — Progress Notes (Signed)
   Subjective:    Patient ID: Jacob Buchanan, male    DOB: [birth year], 53 y.o.   MRN: [id number]  Post activation visit for 53 yo with OSA s/p inspire implant  Implanted 09/04/23 Activation 10/17/23   sensation at 0.5 V  Functional level 1.2 V  Range  1.2-2.2 V    PMH : PAF, asthma, GERD, hyperthyroid, ED, HLD, depression.    TEST/EVENTS:  02/09/2018 HST: AHI 75.9/h, SpO2 low 75% 06/21/2023 HST: AHI NREM AHI 70.8/h, REM AHI 50.2, SpO2 low 61%  Discussed the use of AI scribe software for clinical note transcription with the patient, who gave verbal consent to proceed.  History of Present Illness Jacob Buchanan is a 53 year old male with obstructive sleep apnea who presents for follow-up after hypoglossal nerve stimulator implantation. He was referred by Dr. Izetta for follow-up on his hypoglossal nerve stimulator.  He uses the hypoglossal nerve stimulator 97% of the time, with the device currently set at 1.7 volts, corresponding to level six. He reports improved rest and reduced snoring, with normalized breathing during sleep. Previously, he experienced apneic episodes lasting two to three minutes.  He tracks his sleep with an Oura ring, which inaccurately reports less sleep than he actually gets. He typically sleeps from 9:30 or 10:00 PM to 6:30 or 7:30 AM, but the ring often indicates a later bedtime and only three to four hours of sleep.  He experiences nocturia, waking twice a night to urinate, but does not turn off the device during these times. He has severe sleep apnea with baseline events occurring seventy times an hour.       Review of Systems  neg for any significant sore throat, dysphagia, itching, sneezing, nasal congestion or excess/ purulent secretions, fever, chills, sweats, unintended wt loss, pleuritic or exertional cp, hempoptysis, orthopnea pnd or change in chronic leg swelling. Also denies presyncope, palpitations, heartburn, abdominal pain, nausea, vomiting,  diarrhea or change in bowel or urinary habits, dysuria,hematuria, rash, arthralgias, visual complaints, headache, numbness weakness or ataxia.      Objective:   Physical Exam  Gen. Pleasant, well-nourished, in no distress ENT - no thrush, no pallor/icterus,no post nasal drip Neck: No JVD, no thyromegaly, no carotid bruits Lungs: no use of accessory muscles, no dullness to percussion, clear without rales or rhonchi  Cardiovascular: Rhythm regular, heart sounds  normal, no murmurs or gallops, no peripheral edema Musculoskeletal: No deformities, no cyanosis or clubbing    Stimulation studied with programmer DL >> excellent compliance, no pauses , 9h/night    Assessment & Plan:   Assessment and Plan Assessment & Plan Obstructive sleep apnea status post hypoglossal nerve stimulator implant Obstructive sleep apnea with severe baseline events of seventy times per hour. Status post hypoglossal nerve stimulator implant with excellent compliance and usage at 97%. Currently at level six with 1.7 volts, increased to 1.8 volts today. Reports improved rest and reduced snoring. Girlfriend notes normal breathing during sleep. No red flags noted in current usage. Device is being titrated to find optimal comfort range. Sleep study planned to determine effective level. - Increase stimulator to 1.8 volts, level seven. - Continue to increase by one level every week as tolerated. - Schedule follow-up visit in four weeks for pre-sleep study evaluation. - Schedule sleep study for November. - Plan follow-up visit in December post-sleep study to determine optimal stimulator level.

## 2023-11-28 ENCOUNTER — Other Ambulatory Visit: Payer: Self-pay | Admitting: Physician Assistant

## 2023-12-15 ENCOUNTER — Other Ambulatory Visit: Payer: Self-pay | Admitting: Family Medicine

## 2024-01-03 ENCOUNTER — Ambulatory Visit (INDEPENDENT_AMBULATORY_CARE_PROVIDER_SITE_OTHER): Admitting: Family Medicine

## 2024-01-03 ENCOUNTER — Encounter: Payer: Self-pay | Admitting: Family Medicine

## 2024-01-03 VITALS — BP 138/88 | HR 63 | Temp 97.2°F | Ht 73.0 in | Wt 217.0 lb

## 2024-01-03 DIAGNOSIS — M722 Plantar fascial fibromatosis: Secondary | ICD-10-CM | POA: Insufficient documentation

## 2024-01-03 NOTE — Progress Notes (Signed)
   NIKOLAOS MADDOCKS is a 53 y.o. male who presents today for an office visit.  Assessment/Plan:   Plantar fasciitis History consistent with right plantar fasciitis.  We discussed treatment options.  Recommended conservative management including foot wear with arch support and limiting or avoiding barefoot time is much as possible.  Also discussed home exercise program and handout was given.  Given length of symptoms patient would like to see a specialist for this.  Will place referral to sports medicine for further evaluation and management.  We discussed reasons to return to care.  Follow-up as needed.  He will come back in 3 to 6 months for CPE.      Subjective:  HPI:  See assessment / plan for status of chronic conditions.    Discussed the use of AI scribe software for clinical note transcription with the patient, who gave verbal consent to proceed.  History of Present Illness MAKO PELFREY is a 54 year old male who presents with right heel pain.  He has been experiencing sharp, nagging pain at the bottom of his right heel for over a year. The pain occurs primarily in the morning or after periods of rest, such as waking up in the middle of the night or after sitting for extended periods. It is most intense during the first few steps after getting up, causing him to shuffle to the bathroom.  The pain is not present during the day when he is active and does not currently hurt. He recalls having similar symptoms in the past, which affected the arch of his foot.  He has not been using any specific treatments for this condition recently. He typically goes barefoot at home but has started wearing shoes more frequently due to cold weather. He primarily wears Birkenstocks and Crocs, noting that Crocs provide reasonable arch support. He has not been using any over-the-counter orthotics or specific exercises to address the heel pain.         Objective:  Physical Exam: BP 138/88    Pulse 63   Temp (!) 97.2 F (36.2 C) (Temporal)   Ht 6' 1 (1.854 m)   Wt 217 lb (98.4 kg)   SpO2 99%   BMI 28.63 kg/m   Gen: No acute distress, resting comfortably MUSCULOSKELETAL: - Right Foot: No deformities.  Calcaneus without tenderness to palpation. Neuro: Grossly normal, moves all extremities Psych: Normal affect and thought content      Tamirah George M. Kennyth, MD 01/03/2024 10:22 AM

## 2024-01-03 NOTE — Patient Instructions (Signed)
 It was very nice to see you today!  VISIT SUMMARY: Today, we discussed your ongoing right heel pain, which has been diagnosed as chronic plantar fasciitis. We reviewed treatment options to help alleviate your symptoms.  YOUR PLAN: PLANTAR FASCIITIS, RIGHT FOOT: You have chronic plantar fasciitis in your right heel, which has been causing sharp pain, especially in the morning or after periods of rest. -Use over-the-counter arch supports such as Dr. Heriberto plantar fasciitis inserts. -Limit the time you spend barefoot on hard surfaces. Wear supportive footwear. -Follow the exercises and stretches provided in the handout, including rolling a frozen water bottle under your foot. -Consider short-term use of anti-inflammatory medication, but avoid long-term use. -If your symptoms persist, we can refer you to sports medicine for further evaluation and possible cortisone injections.  Return in about 6 months (around 07/03/2024) for Annual Physical.   Take care, Dr Kennyth  PLEASE NOTE:  If you had any lab tests, please let us  know if you have not heard back within a few days. You may see your results on mychart before we have a chance to review them but we will give you a call once they are reviewed by us .   If we ordered any referrals today, please let us  know if you have not heard from their office within the next week.   If you had any urgent prescriptions sent in today, please check with the pharmacy within an hour of our visit to make sure the prescription was transmitted appropriately.   Please try these tips to maintain a healthy lifestyle:  Eat at least 3 REAL meals and 1-2 snacks per day.  Aim for no more than 5 hours between eating.  If you eat breakfast, please do so within one hour of getting up.   Each meal should contain half fruits/vegetables, one quarter protein, and one quarter carbs (no bigger than a computer mouse)  Cut down on sweet beverages. This includes juice, soda, and  sweet tea.   Drink at least 1 glass of water with each meal and aim for at least 8 glasses per day  Exercise at least 150 minutes every week.

## 2024-01-15 ENCOUNTER — Encounter (HOSPITAL_BASED_OUTPATIENT_CLINIC_OR_DEPARTMENT_OTHER): Payer: Self-pay | Admitting: Pulmonary Disease

## 2024-01-15 ENCOUNTER — Ambulatory Visit (INDEPENDENT_AMBULATORY_CARE_PROVIDER_SITE_OTHER): Admitting: Pulmonary Disease

## 2024-01-15 VITALS — BP 147/97 | HR 63 | Ht 73.0 in | Wt 217.2 lb

## 2024-01-15 DIAGNOSIS — Z9682 Presence of neurostimulator: Secondary | ICD-10-CM

## 2024-01-15 DIAGNOSIS — G4733 Obstructive sleep apnea (adult) (pediatric): Secondary | ICD-10-CM | POA: Diagnosis not present

## 2024-01-15 NOTE — Progress Notes (Signed)
   Subjective:    Patient ID: Jacob Buchanan, male    DOB: [birth year], 53 y.o.   MRN: [id number]    53 yo with OSA s/p inspire implant  Implanted 09/04/23 Activation 10/17/23    sensation at 0.5 V  Functional level 1.2 V  Range  1.2-2.2 V     PMH : PAF, asthma, GERD, hyperthyroid, ED, HLD, depression.    OV 11/2023 >>incoming 1.7 >> increased to 1.8     Discussed the use of AI scribe software for clinical note transcription with the patient, who gave verbal consent to proceed.  History of Present Illness Jacob Buchanan is a 53 year old male with sleep apnea who presents for a readiness check before a sleep study. He is accompanied by Randall from the company.  He is preparing for a sleep study to evaluate his sleep apnea. He uses an Insurance Account Manager device, which he has adjusted by reducing the activation strength due to discomfort. He uses the device for nine and a half hours nightly, experiencing slight reduction in fatigue but no significant improvement. The Aura ring indicates poor sleep quality, with six to six and a half hours of sleep despite nine hours in bed, and shows one and a half hours of REM sleep but only 20 minutes of deep sleep. Oxygen levels are typically good, and the ring does not show apnea events but does indicate heart rate variability. He moves frequently during sleep.      Significant tests/ events reviewed  02/09/2018 HST: AHI 75.9/h, SpO2 low 75% 06/21/2023 HST: AHI NREM AHI 70.8/h, REM AHI 50.2, SpO2 low 61%   Review of Systems  neg for any significant sore throat, dysphagia, itching, sneezing, nasal congestion or excess/ purulent secretions, fever, chills, sweats, unintended wt loss, pleuritic or exertional cp, hempoptysis, orthopnea pnd or change in chronic leg swelling. Also denies presyncope, palpitations, heartburn, abdominal pain, nausea, vomiting, diarrhea or change in bowel or urinary habits, dysuria,hematuria, rash, arthralgias, visual complaints,  headache, numbness weakness or ataxia.      Objective:   Physical Exam  Gen. Pleasant, well-nourished, in no distress ENT - no thrush, no pallor/icterus,no post nasal drip Neck: No JVD, no thyromegaly, no carotid bruits Lungs: no use of accessory muscles, no dullness to percussion, clear without rales or rhonchi  Cardiovascular: Rhythm regular, heart sounds  normal, no murmurs or gallops, no peripheral edema Musculoskeletal: No deformities, no cyanosis or clubbing        Assessment & Plan:   Assessment and Plan Assessment & Plan Obstructive sleep apnea Severe obstructive sleep apnea with 60-70 events per hour. Current treatment with Inspire device shows good compliance but minimal improvement in symptoms. He reports feeling slightly better but not significantly. Current device level is 1.6, which he finds strong. No significant improvement with CPAP use. Sleep study scheduled to evaluate effectiveness of current treatment and determine optimal device settings. 80-90% of patients find a suitable level, while 10-20% may require alternative configurations. - Increased Inspire device level to 1.7 volts. - Proceed with scheduled sleep study to evaluate device effectiveness and determine optimal settings. - If no suitable level is found, will consider alternative device configurations.

## 2024-01-15 NOTE — Patient Instructions (Signed)
  VISIT SUMMARY: You came in today for a readiness check before your upcoming sleep study. We discussed your current use of the Inspire device for your sleep apnea and reviewed your sleep quality and symptoms.  YOUR PLAN: -OBSTRUCTIVE SLEEP APNEA: Obstructive sleep apnea is a condition where your airway becomes blocked during sleep, causing breathing interruptions. You have severe obstructive sleep apnea with 60-70 events per hour. Currently, you are using the [facility] device, which you have adjusted to 1.6 volts due to discomfort. Although you use the device for nine and a half hours nightly, you have only experienced slight improvement in fatigue. We have increased the device level to 1.7 volts and will proceed with the scheduled sleep study to evaluate the effectiveness of the current treatment and determine the optimal settings. If we cannot find a suitable level, we will consider alternative device configurations.  INSTRUCTIONS: Please proceed with your scheduled sleep study to evaluate the effectiveness of your current treatment and determine the optimal settings for your Inspire device.                      Contains text generated by Abridge.                                 Contains text generated by Abridge.

## 2024-01-16 NOTE — Progress Notes (Unsigned)
 Jacob Buchanan [facility] [address] [place] [address] Phone: [phone]   Assessment and Plan:     1. Plantar fasciitis (Primary) -Chronic with exacerbation, initial visit - Most consistent with plantar fasciitis of right foot localized primarily to right calcaneus.  Likely exacerbated by barefoot walking on hardwood floor at home - Do not recommend NSAIDs or p.o. prednisone  courses with past medical history of atrial fibrillation on chronic anticoagulation with Eliquis  - Start HEP for plantar fasciitis - Recommend wearing recovery sandals such as glucose brand at home to decrease trauma to calcaneus and plantar fascia  15 additional minutes spent for educating Therapeutic Home Exercise Program.  This included exercises focusing on stretching, strengthening, with focus on eccentric aspects.   Long term goals include an improvement in range of motion, strength, endurance as well as avoiding reinjury. Patient's frequency would include in 1-2 times a day, 3-5 times a week for a duration of 6-12 weeks. Proper technique shown and discussed handout in great detail with ATC.  All questions were discussed and answered.      Pertinent previous records reviewed include none   Follow Up: As needed if no improvement or worsening of symptoms.  Could consider plantar fasciitis CSI versus ECSWT     Subjective:   I, Jacob Buchanan, am serving as a neurosurgeon for Doctor Jacob Buchanan  Chief Complaint: foot pain   HPI:   01/17/2024 Patient is a 53 year old male with foot pain. Patient states right foot pain. Pain is on the heel. No radiating pain. No meds for the pain. Pain for over a year. Pain is intermittent. Pain is increased in the middle of the night when he puts his feet on the ground    Relevant Historical Information: Chronic anticoagulation on Eliquis  with history of atrial fibrillation, GERD  Additional pertinent review of systems  negative.   Current Outpatient Medications:    apixaban  (ELIQUIS ) 5 MG TABS tablet, TAKE 1 TABLET BY MOUTH 2 TIMES A DAY, Disp: 60 tablet, Rfl: 5   empagliflozin  (JARDIANCE ) 10 MG TABS tablet, Take 1 tablet (10 mg total) by mouth daily before breakfast., Disp: 90 tablet, Rfl: 3   ferrous sulfate 325 (65 FE) MG tablet, Take 325 mg by mouth in the morning., Disp: , Rfl:    metoprolol  succinate (TOPROL -XL) 100 MG 24 hr tablet, Take 1 tablet (100 mg total) by mouth daily. TAKE WITH OR IMMEDIATELY FOLLOWING A MEAL., Disp: 90 tablet, Rfl: 3   Multiple Vitamin (MULTIVITAMIN) tablet, Take 1 tablet by mouth 4 (four) times a week., Disp: , Rfl:    omeprazole  (PRILOSEC) 40 MG capsule, TAKE 1 CAPSULE BY MOUTH TWICE A DAY, Disp: 180 capsule, Rfl: 1   sildenafil  (VIAGRA ) 100 MG tablet, Take 0.5-1 tablets (50-100 mg total) by mouth daily as needed for erectile dysfunction., Disp: 90 tablet, Rfl: 0   tadalafil  (CIALIS ) 20 MG tablet, Take 0.5-1 tablets (10-20 mg total) by mouth daily as needed for erectile dysfunction., Disp: 90 tablet, Rfl: 0   valACYclovir  (VALTREX ) 1000 MG tablet, TAKE 1 TABLET BY MOUTH DAILY, Disp: 30 tablet, Rfl: 1   Objective:     Vitals:   01/17/24 1058  BP: 130/68  Pulse: 66  SpO2: 96%  Weight: 213 lb (96.6 kg)  Height: 6' 1 (1.854 m)      Body mass index is 28.1 kg/m.    Physical Exam:    Gen: Appears well, nad, nontoxic and pleasant Psych:  Alert and oriented, appropriate mood and affect Neuro: sensation intact, strength is 5/5 with df/pf/inv/ev, muscle tone wnl Skin: no susupicious lesions or rashes  Right foot/ankle:  No deformity, no swelling or effusion   TTP mildly calcaneus NTTP over fibular head, lat mal, medial mal, achilles, navicular, base of 5th, ATFL, CFL, deltoid, calcaneous or midfoot ROM DF 30, PF 45, inv/ev intact Negative ant drawer, talar tilt, rotation test, squeeze test. Neg thompson No pain with resisted inversion or eversion     Electronically signed by:  Jacob Buchanan [facility] 11:16 AM 01/17/24

## 2024-01-17 ENCOUNTER — Ambulatory Visit (INDEPENDENT_AMBULATORY_CARE_PROVIDER_SITE_OTHER): Admitting: Sports Medicine

## 2024-01-17 VITALS — BP 130/68 | HR 66 | Ht 73.0 in | Wt 213.0 lb

## 2024-01-17 DIAGNOSIS — M722 Plantar fascial fibromatosis: Secondary | ICD-10-CM | POA: Diagnosis not present

## 2024-01-17 NOTE — Patient Instructions (Signed)
 Foot HEP   Recommend recovery sandals such as oofos especially around the house  As needed follow up if interested injection

## 2024-01-24 ENCOUNTER — Ambulatory Visit (HOSPITAL_BASED_OUTPATIENT_CLINIC_OR_DEPARTMENT_OTHER): Attending: Pulmonary Disease | Admitting: Pulmonary Disease

## 2024-01-24 DIAGNOSIS — G4733 Obstructive sleep apnea (adult) (pediatric): Secondary | ICD-10-CM | POA: Insufficient documentation

## 2024-02-03 ENCOUNTER — Ambulatory Visit: Payer: Self-pay | Admitting: Pulmonary Disease

## 2024-02-03 NOTE — Procedures (Signed)
 Darryle [facility] [address] [place], [place] [address] Tel: [phone]   Fax: [phone]  Inspire Interpretation  Patient Name:  Jacob Buchanan, Jacob Buchanan Date:  01/24/2024 Referring Physician:  HARDEN Polette Nofsinger [phone]) %%startinterp%% Indications for Polysomnography The patient is a 53 year-old Male who is 6' 1 and weighs 208.0 lbs. His BMI equals 27.6.  A full night inspire treatment study was performed. 02/09/2018 HST: AHI 75.9/h, SpO2 low 75% 06/21/2023 HST: AHI NREM AHI 70.8/h, REM AHI 50.2, SpO2 low 61%   Medication  MELATONIN/MAGNESIUM    Polysomnogram Data A full night polysomnogram recorded the standard physiologic parameters including EEG, EOG, EMG, EKG, nasal and oral airflow.  Respiratory parameters of chest and abdominal movements were recorded with Respiratory Inductance Plethysmography belts.  Oxygen saturation was recorded by pulse oximetry.   Sleep Architecture The total recording time of the polysomnogram was 403.8 minutes.  The total sleep time was 373.0 minutes.  The patient spent 3.1% of total sleep time in Stage N1, 82.4% in Stage N2, 0.0% in Stages N3, and 14.5% in REM.  Sleep latency was 3.1 minutes.  REM latency was 102.0 minutes.  Sleep Efficiency was 92.4%.  Wake after Sleep Onset time was 27.5 minutes.  Inspire Summary The patient was on inspire therapy at levels ranging from 1.2* volts up to 2.4* volts.  The last level used in the study was 1.4* volts.  Respiratory Events The polysomnogram revealed a presence of 5 obstructive, 17 central, and 36 mixed apneas resulting in an Apnea index of 9.3 events per hour.  There were 413 hypopneas (>=3% desaturation and/or arousal) resulting in an Apnea\Hypopnea Index (AHI >=3% desaturation and/or arousal) of 75.8 events per hour.  There were 373 hypopneas (>=4% desaturation) resulting in an Apnea\Hypopnea Index (AHI >=4% desaturation) of 69.3 events per hour.  There were 8 Respiratory Effort  Related Arousals resulting in a RERA index of 1.3 events per hour. The Respiratory Disturbance Index is 77.1 events per hour.  The snore index was - events per hour.  Mean oxygen saturation was 85.2%.  The lowest oxygen saturation during sleep was 60.0%.  Time spent <=88% oxygen saturation was 243.9 minutes (61.4%).  Limb Activity There were - limb movements recorded.  Of this total, - were classified as PLMs.  Of the PLMs, - were associated with arousals.  The Limb Movement index was - per hour while the PLM index was - per hour.  Cardiac Summary The average pulse rate was 61.2 bpm.  The minimum pulse rate was 36.0 bpm while the maximum pulse rate was 92.0 bpm.  Cardiac rhythm was normal/abnormal.  Comments: Incoming amplitude was 1.7 V. He was studied from 1.2-2.4 V during this tudy. No therapeutic amplitude was obtained since respiratory events persisted at all levels  Diagnosis: Severe OSA , not currected by current inspire configuration   Recommendations: Consider alternate electrode configurations & if that pathway is not successful, then awake endoscopy may be indicated   This study was personally reviewed and electronically signed by: Dr. Harden Staff Accredited Board Certified in Sleep Medicine

## 2024-02-19 ENCOUNTER — Other Ambulatory Visit: Payer: Self-pay | Admitting: Family Medicine

## 2024-02-22 ENCOUNTER — Ambulatory Visit (HOSPITAL_BASED_OUTPATIENT_CLINIC_OR_DEPARTMENT_OTHER): Admitting: Pulmonary Disease

## 2024-02-26 ENCOUNTER — Other Ambulatory Visit: Payer: Self-pay | Admitting: Family Medicine

## 2024-02-28 ENCOUNTER — Other Ambulatory Visit (HOSPITAL_COMMUNITY): Payer: Self-pay

## 2024-02-28 ENCOUNTER — Telehealth: Payer: Self-pay

## 2024-02-28 NOTE — Telephone Encounter (Signed)
 Pharmacy Patient Advocate Encounter  Received notification from [facility] that Prior Authorization for Omeprazole  40mg  caps has been APPROVED from 02/28/24 to 02/27/25   PA #/Case ID/Reference #: [id number]  Approval letter indexed to media tab

## 2024-02-28 NOTE — Telephone Encounter (Signed)
 Pharmacy Patient Advocate Encounter   Received notification from Onbase that prior authorization for Omeprazole  40MG  dr capsules is required/requested.   Insurance verification completed.   The patient is insured through [facility].   Per test claim: PA required; PA submitted to above mentioned insurance via Latent Key/confirmation #/EOC [id number] Status is pending

## 2024-03-01 NOTE — Telephone Encounter (Signed)
 LVM Rx Omeprazole  40mg  caps has been APPROVED from 02/28/24 to 02/27/25

## 2024-03-12 ENCOUNTER — Ambulatory Visit: Payer: Self-pay

## 2024-03-12 NOTE — Telephone Encounter (Signed)
 Appt today

## 2024-03-12 NOTE — Telephone Encounter (Signed)
 FYI Only or Action Required?: FYI only for provider: appointment scheduled on 03/13/2024.  Patient was last seen in primary care on 01/03/2024 by Kennyth Worth HERO, MD.  Called Nurse Triage reporting Cough.  Symptoms began several days ago.  Interventions attempted: OTC medications: dayquil/nyquil.  Symptoms are: unchanged.  Triage Disposition: See HCP Within 4 Hours (Or PCP Triage)  Patient/caregiver understands and will follow disposition?: Yes  Reason for Disposition  Wheezing is present  Answer Assessment - Initial Assessment Questions Nyquil, dayquil.  1. ONSET: When did the cough begin?      3-4 days 3. SPUTUM: Describe the color of your sputum (e.g., none, dry cough; clear, white, yellow, green)     Clear 4. HEMOPTYSIS: Are you coughing up any blood? If Yes, ask: How much? (e.g., flecks, streaks, tablespoons, etc.)     Denies 5. DIFFICULTY BREATHING: Are you having difficulty breathing? If Yes, ask: How bad is it? (e.g., mild, moderate, severe)      Patient is having trouble breathing. Used netipot 6. FEVER: Do you have a fever? If Yes, ask: What is your temperature, how was it measured, and when did it start?     Denies 7. CARDIAC HISTORY: Do you have any history of heart disease? (e.g., heart attack, congestive heart failure)      Afib 8. LUNG HISTORY: Do you have any history of lung disease?  (e.g., pulmonary embolus, asthma, emphysema)     Asthma as a kid that hasn't bothered him since he was about 4 9. PE RISK FACTORS: Do you have a history of blood clots? (or: recent major surgery, recent prolonged travel, bedridden)     Denies 10. OTHER SYMPTOMS: Do you have any other symptoms? (e.g., runny nose, wheezing, chest pain)       Wheezing that has gotten worse in the last day. 12. TRAVEL: Have you traveled out of the country in the last month? (e.g., travel history, exposures)       Denies  Protocols used: Cough - Acute Productive-A-AH

## 2024-03-13 ENCOUNTER — Encounter: Payer: Self-pay | Admitting: Family

## 2024-03-13 ENCOUNTER — Ambulatory Visit (INDEPENDENT_AMBULATORY_CARE_PROVIDER_SITE_OTHER): Payer: Self-pay | Admitting: Family

## 2024-03-13 VITALS — BP 124/80 | HR 74 | Temp 98.2°F | Ht 73.0 in | Wt 216.0 lb

## 2024-03-13 DIAGNOSIS — J9801 Acute bronchospasm: Secondary | ICD-10-CM

## 2024-03-13 DIAGNOSIS — B349 Viral infection, unspecified: Secondary | ICD-10-CM

## 2024-03-13 MED ORDER — METHYLPREDNISOLONE ACETATE 40 MG/ML IJ SUSP
60.0000 mg | Freq: Once | INTRAMUSCULAR | Status: AC
  Administered 2024-03-13: 60 mg via INTRAMUSCULAR

## 2024-03-13 MED ORDER — AIRSUPRA 90-80 MCG/ACT IN AERO: 2.0000 | INHALATION_SPRAY | Freq: Four times a day (QID) | RESPIRATORY_TRACT | Status: AC | PRN

## 2024-03-13 MED ORDER — PREDNISONE 10 MG PO TABS: ORAL_TABLET | ORAL | 0 refills | Status: AC

## 2024-03-13 NOTE — Progress Notes (Signed)
 "  Patient ID: Jacob Buchanan, male    DOB: 05-06-1970, 54 y.o.   MRN: [id number]  Chief Complaint  Patient presents with   cough    Pt c/o cough with clear mucus and wheezing. Present for 1 week.  Has tried airsupra  and mucinex -DM, which did help slightly.   Discussed the use of AI scribe software for clinical note transcription with the patient, who gave verbal consent to proceed.  History of Present Illness Jacob Buchanan is a 54 year old male with atrial fibrillation who presents with wheezing and cough.  He has had gradual-onset wheezing and cough for 1 week, worsening to the point he could not sleep two nights ago due to difficulty lying down. Symptoms improved last night after using his inhaler. He is using leftover AirSupra  from last year with noticeable relief. He stopped NyQuil because menthol worsened his symptoms. He has shortness of breath and chest tightness, which concerns him because atrial fibrillation has previously been triggered by violent coughing. His mucus is mostly clear with some sinus congestion, and he uses nasal irrigation. He has no sore throat, ear pain, or significant nasal drainage. He has had no fever. His sinuses feel improved, though congestion persists and he feels it moving into his chest.  Assessment & Plan Persistent cough with wheezing Symptoms improved with AirSupra  inhaler. Exp wheezing auscultated.  - Continue Airsupra , 2 puffs 4x/day as needed, use in am & before bedtime. - Administered DepoMedrol 60mg  injection for inflammation & quick relief of sx. - Prescribed prednisone  taper for five days, starting tomorrow, to be taken in the morning after breakfast. - Advised to drink at least two liters of water daily. - Instructed to avoid NSAIDs with prednisone ; Tylenol  is permissible. - Advised to contact if symptoms persist beyond initial treatment for potential extension of prednisone  course.  Subjective:    Outpatient Medications Prior to  Visit  Medication Sig Dispense Refill   apixaban  (ELIQUIS ) 5 MG TABS tablet TAKE 1 TABLET BY MOUTH 2 TIMES A DAY 60 tablet 5   empagliflozin  (JARDIANCE ) 10 MG TABS tablet Take 1 tablet (10 mg total) by mouth daily before breakfast. 90 tablet 3   ferrous sulfate 325 (65 FE) MG tablet Take 325 mg by mouth in the morning.     metoprolol  succinate (TOPROL -XL) 100 MG 24 hr tablet Take 1 tablet (100 mg total) by mouth daily. TAKE WITH OR IMMEDIATELY FOLLOWING A MEAL. 90 tablet 3   Multiple Vitamin (MULTIVITAMIN) tablet Take 1 tablet by mouth 4 (four) times a week.     omeprazole  (PRILOSEC) 40 MG capsule TAKE 1 CAPSULE BY MOUTH 2 TIMES A DAY 60 capsule 0   sildenafil  (VIAGRA ) 100 MG tablet Take 0.5-1 tablets (50-100 mg total) by mouth daily as needed for erectile dysfunction. 90 tablet 0   tadalafil  (CIALIS ) 20 MG tablet Take 0.5-1 tablets (10-20 mg total) by mouth daily as needed for erectile dysfunction. 90 tablet 0   valACYclovir  (VALTREX ) 1000 MG tablet TAKE 1 TABLET BY MOUTH DAILY 30 tablet 1   No facility-administered medications prior to visit.   Past Medical History:  Diagnosis Date   Anemia    Atrial fibrillation (HCC)    CHF (congestive heart failure) (HCC)    ED (erectile dysfunction)    Esophageal stricture    GERD (gastroesophageal reflux disease)    Bertrum syndrome    HSV (herpes simplex virus) anogenital infection    Hyperlipidemia    Incomplete right bundle branch block  Partial tear of right rotator cuff    Sleep apnea    Past Surgical History:  Procedure Laterality Date   ATRIAL FIBRILLATION ABLATION N/A 08/31/2022   Procedure: ATRIAL FIBRILLATION ABLATION;  Surgeon: Nancey Eulas BRAVO, MD;  Location: [facility];  Service: Cardiovascular;  Laterality: N/A;   CARDIOVERSION N/A 03/29/2022   Procedure: CARDIOVERSION;  Surgeon: Francyne Headland, MD;  Location: [facility] ENDOSCOPY;  Service: Cardiovascular;  Laterality: N/A;   CARDIOVERSION N/A 06/07/2022   Procedure:  CARDIOVERSION;  Surgeon: Mona Vinie BROCKS, MD;  Location: [facility] ENDOSCOPY;  Service: Cardiovascular;  Laterality: N/A;   COLONOSCOPY  2004 ; 2007; & 05-29-2012   last one with propofol  and hemorroidectomy with sclerosing   COLONOSCOPY  09/05/2019   DRUG INDUCED ENDOSCOPY Bilateral 10/31/2022   Procedure: DRUG INDUCED SLEEP  ENDOSCOPY;  Surgeon: Carlie Clark, MD;  Location: [facility] OR;  Service: ENT;  Laterality: Bilateral;   FINGER SURGERY  1994   repair/ reconstruction right index crush injury   INGUINAL HERNIA REPAIR Bilateral age 49   SHOULDER ARTHROSCOPY WITH LABRAL REPAIR Right 12/31/2013   Procedure: RIGHT SHOULDER ARTHROSCOPY WITH LABRAL REPAIR ;  Surgeon: Lamar Collet, MD;  Location: [facility];  Service: Orthopedics;  Laterality: Right;   UPPER GI ENDOSCOPY  2015   esophageal dilation   Allergies[1]    Objective:    Physical Exam Vitals and nursing note reviewed.  Constitutional:      General: He is not in acute distress.    Appearance: Normal appearance.  HENT:     Head: Normocephalic.     Right Ear: Tympanic membrane and ear canal normal.     Left Ear: Tympanic membrane and ear canal normal.     Nose:     Right Sinus: Frontal sinus tenderness present. No maxillary sinus tenderness.     Left Sinus: Frontal sinus tenderness present. No maxillary sinus tenderness.     Mouth/Throat:     Mouth: Mucous membranes are moist.     Pharynx: No pharyngeal swelling, oropharyngeal exudate, posterior oropharyngeal erythema or uvula swelling.     Tonsils: No tonsillar exudate or tonsillar abscesses.  Cardiovascular:     Rate and Rhythm: Normal rate and regular rhythm.  Pulmonary:     Effort: Pulmonary effort is normal.     Breath sounds: Examination of the right-upper field reveals wheezing. Examination of the left-upper field reveals wheezing. Examination of the right-middle field reveals wheezing. Examination of the left-middle field reveals wheezing. Wheezing (expiratory)  present.  Musculoskeletal:        General: Normal range of motion.     Cervical back: Normal range of motion.  Lymphadenopathy:     Head:     Right side of head: No preauricular or posterior auricular adenopathy.     Left side of head: No preauricular or posterior auricular adenopathy.     Cervical: No cervical adenopathy.  Skin:    General: Skin is warm and dry.  Neurological:     Mental Status: He is alert and oriented to person, place, and time.  Psychiatric:        Mood and Affect: Mood normal.    BP 124/80 (BP Location: Left Arm, Patient Position: Sitting, Cuff Size: Large)   Pulse 74   Temp 98.2 F (36.8 C) (Temporal)   Ht 6' 1 (1.854 m)   Wt 216 lb (98 kg)   SpO2 93%   BMI 28.50 kg/m  Wt Readings from Last 3 Encounters:  03/13/24  216 lb (98 kg)  01/24/24 208 lb (94.3 kg)  01/17/24 213 lb (96.6 kg)      Lucius Krabbe, NP     [1]  Allergies Allergen Reactions   Pollen Extract    "

## 2024-03-30 ENCOUNTER — Other Ambulatory Visit: Payer: Self-pay | Admitting: Family Medicine

## 2024-04-04 ENCOUNTER — Encounter (HOSPITAL_BASED_OUTPATIENT_CLINIC_OR_DEPARTMENT_OTHER): Payer: Self-pay | Admitting: Pulmonary Disease

## 2024-04-04 ENCOUNTER — Ambulatory Visit (HOSPITAL_BASED_OUTPATIENT_CLINIC_OR_DEPARTMENT_OTHER): Payer: Self-pay | Admitting: Pulmonary Disease

## 2024-04-04 VITALS — BP 135/81 | HR 65 | Ht 73.0 in | Wt 215.4 lb

## 2024-04-04 DIAGNOSIS — G4733 Obstructive sleep apnea (adult) (pediatric): Secondary | ICD-10-CM

## 2024-04-04 DIAGNOSIS — Z9682 Presence of neurostimulator: Secondary | ICD-10-CM

## 2024-04-04 NOTE — Progress Notes (Signed)
 "  Subjective:    Patient ID: Jacob Buchanan, male    DOB: 1970-05-16, 54 y.o.   MRN: [id number]   54 yo with OSA s/p inspire implant  Implanted 09/04/23 Activation 10/17/23    sensation at 0.5 V  Functional level 1.2 V  Range  1.2-2.2 V     PMH : PAF, asthma, GERD, hyperthyroid, ED, HLD, depression.    On electrode A configuration, increased upto 1.7 V, no therapeutic ampl found on sleep study  Discussed the use of AI scribe software for clinical note transcription with the patient, who gave verbal consent to proceed.  History of Present Illness Jacob Buchanan is a 54 year old male with sleep apnea who presents with issues related to his Inspire device. He is accompanied by Damien, a known acquaintance. He was referred by Dr. Olena, a sleep doctor, for evaluation of his Inspire device issues.  He has avoided Inspire use for the past month due to a recent respiratory infection with airway irritation that worsened when the device was on, so he has been sleeping sitting up for drainage.  He finds Inspire uncomfortable at higher settings. He can only tolerate level 1, while levels 2 and 3 feel too strong. His prior sleep study showed obstructive events at all stimulation levels, with fewer events when not supine. Baseline apnea events were 70, with the best reduction to 40 events.  He currently sleeps upright on two pillows for comfort. He sometimes wakes around 4 AM due to discomfort, then lies flat for the last few hours of sleep. He notes the device does not seem to synchronize with his breathing when he is awake.   Oura ring data was reviewed   Significant tests/ events reviewed   02/09/2018 HST: AHI 75.9/h, SpO2 low 75% 06/21/2023 HST: AHI NREM AHI 70.8/h, REM AHI 50.2, SpO2 low 61%  01/2024 inspire titration >> Incoming amplitude was 1.7 V. He was studied from 1.2-2.4 V during this tudy. No therapeutic amplitude was obtained  Events worse during supine REM  Review of  Systems  neg for any significant sore throat, dysphagia, itching, sneezing, nasal congestion or excess/ purulent secretions, fever, chills, sweats, unintended wt loss, pleuritic or exertional cp, hempoptysis, orthopnea pnd or change in chronic leg swelling. Also denies presyncope, palpitations, heartburn, abdominal pain, nausea, vomiting, diarrhea or change in bowel or urinary habits, dysuria,hematuria, rash, arthralgias, visual complaints, headache, numbness weakness or ataxia.      Objective:   Physical Exam  Gen. Pleasant, well-nourished, in no distress ENT - no thrush, no pallor/icterus,no post nasal drip Neck: No JVD, no thyromegaly, no carotid bruits Lungs: no use of accessory muscles, no dullness to percussion, clear without rales or rhonchi  Cardiovascular: Rhythm regular, heart sounds  normal, no murmurs or gallops, no peripheral edema Musculoskeletal: No deformities, no cyanosis or clubbing    Tongue stimulation/protrusion checked with programmer at multiple levels - elec B & C config were studied       Assessment & Plan:   Assessment and Plan Assessment & Plan Obstructive sleep apnea S/p HNS Suboptimal response to current Inspire device settings. Previous titration study showed events reduced to 40 from a baseline of 70, which is still above the target of less than 30. He reports improved sleep quality when sleeping upright, with sleep scores of 80 and above. Current settings are not providing therapeutic amplitude, and he experiences discomfort with higher settings. Alternative electrode configurations (B and C) show promise in providing better  tongue motion and comfort. - Adjusted Inspire device to B configuration at 0.6 volts. - Instructed to increase device settings by one level every two weeks, aiming for 0.8 to 0.9 volts. - Will plan for a home sleep study once settings reach 0.8 to 0.9 volts. - Encouraged continued use of sleep ring to monitor sleep quality and  breathing disturbances. - Will revisit in 4-6 weeks to assess progress and adjust settings as needed. - SLeep with HOB elevated     "

## 2024-04-04 NOTE — Patient Instructions (Signed)
" °  VISIT SUMMARY: Jacob Buchanan, a 54 year old male with sleep apnea, visited today to address issues with his Inspire device. He has not used the device for the past month due to a recent respiratory infection and finds higher settings uncomfortable. His previous sleep study showed a reduction in apnea events, but the current settings are not optimal.  YOUR PLAN: -OBSTRUCTIVE SLEEP APNEA: Obstructive sleep apnea is a condition where the airway becomes blocked during sleep, causing breathing interruptions. Your current Inspire device settings are not providing the desired therapeutic effect, and you experience discomfort at higher settings. We have adjusted your device to a B configuration at 0.6 volts. You should increase the device settings by one level every two weeks, aiming for 0.8 to 0.9 volts. We will plan for a home sleep study once the settings reach 0.8 to 0.9 volts. Continue using the sleep ring to monitor your sleep quality and breathing disturbances. We will revisit in 4-6 weeks to assess your progress and make any necessary adjustments.  INSTRUCTIONS: Increase the Inspire device settings by one level every two weeks, aiming for 0.8 to 0.9 volts / level 4. Plan for a home sleep study once the settings reach 0.8 to 0.9 volts. Continue using the sleep ring to monitor sleep quality and breathing disturbances. Follow up in 4-6 weeks to assess progress and adjust settings as needed.    Contains text generated by [employer]bridge.   "

## 2024-05-23 ENCOUNTER — Encounter (HOSPITAL_BASED_OUTPATIENT_CLINIC_OR_DEPARTMENT_OTHER): Payer: Self-pay | Admitting: Pulmonary Disease
